# Patient Record
Sex: Female | Born: 1945 | Race: Black or African American | Hispanic: No | Marital: Single | Smoking: Former smoker
Health system: Southern US, Community
[De-identification: ages and names within clinical notes are randomized; demographics above are authoritative.]

## PROBLEM LIST (undated history)

## (undated) DIAGNOSIS — M4800 Spinal stenosis, site unspecified: Secondary | ICD-10-CM

## (undated) DIAGNOSIS — E039 Hypothyroidism, unspecified: Secondary | ICD-10-CM

## (undated) DIAGNOSIS — E119 Type 2 diabetes mellitus without complications: Secondary | ICD-10-CM

## (undated) DIAGNOSIS — N390 Urinary tract infection, site not specified: Secondary | ICD-10-CM

## (undated) DIAGNOSIS — Z9889 Other specified postprocedural states: Secondary | ICD-10-CM

## (undated) DIAGNOSIS — R112 Nausea with vomiting, unspecified: Secondary | ICD-10-CM

## (undated) DIAGNOSIS — I10 Essential (primary) hypertension: Secondary | ICD-10-CM

## (undated) DIAGNOSIS — J449 Chronic obstructive pulmonary disease, unspecified: Secondary | ICD-10-CM

## (undated) DIAGNOSIS — J45909 Unspecified asthma, uncomplicated: Secondary | ICD-10-CM

## (undated) DIAGNOSIS — D649 Anemia, unspecified: Secondary | ICD-10-CM

## (undated) DIAGNOSIS — E669 Obesity, unspecified: Secondary | ICD-10-CM

## (undated) DIAGNOSIS — R739 Hyperglycemia, unspecified: Secondary | ICD-10-CM

## (undated) DIAGNOSIS — M1990 Unspecified osteoarthritis, unspecified site: Secondary | ICD-10-CM

## (undated) DIAGNOSIS — K219 Gastro-esophageal reflux disease without esophagitis: Secondary | ICD-10-CM

## (undated) DIAGNOSIS — E876 Hypokalemia: Secondary | ICD-10-CM

## (undated) HISTORY — PX: CHOLECYSTECTOMY: SHX55

## (undated) HISTORY — PX: ANKLE FUSION: SHX881

## (undated) HISTORY — PX: ABDOMINAL HYSTERECTOMY: SHX81

---

## 1997-04-27 ENCOUNTER — Other Ambulatory Visit: Admission: RE | Admit: 1997-04-27 | Discharge: 1997-04-27 | Payer: Self-pay | Admitting: Obstetrics

## 1997-11-23 ENCOUNTER — Ambulatory Visit (HOSPITAL_COMMUNITY): Admission: RE | Admit: 1997-11-23 | Discharge: 1997-11-23 | Payer: Self-pay | Admitting: Pulmonary Disease

## 1997-11-23 ENCOUNTER — Encounter: Payer: Self-pay | Admitting: Pulmonary Disease

## 1998-02-23 ENCOUNTER — Encounter: Admission: RE | Admit: 1998-02-23 | Discharge: 1998-05-24 | Payer: Self-pay | Admitting: Orthopedic Surgery

## 1998-07-30 ENCOUNTER — Encounter: Payer: Self-pay | Admitting: Pulmonary Disease

## 1998-07-30 ENCOUNTER — Ambulatory Visit (HOSPITAL_COMMUNITY): Admission: RE | Admit: 1998-07-30 | Discharge: 1998-07-30 | Payer: Self-pay | Admitting: Pulmonary Disease

## 1999-01-01 ENCOUNTER — Ambulatory Visit (HOSPITAL_COMMUNITY): Admission: RE | Admit: 1999-01-01 | Discharge: 1999-01-01 | Payer: Self-pay | Admitting: Obstetrics

## 1999-01-01 ENCOUNTER — Encounter: Payer: Self-pay | Admitting: Obstetrics

## 1999-02-08 ENCOUNTER — Emergency Department (HOSPITAL_COMMUNITY): Admission: EM | Admit: 1999-02-08 | Discharge: 1999-02-08 | Payer: Self-pay | Admitting: Emergency Medicine

## 1999-02-12 ENCOUNTER — Encounter: Payer: Self-pay | Admitting: Emergency Medicine

## 1999-02-12 ENCOUNTER — Emergency Department (HOSPITAL_COMMUNITY): Admission: EM | Admit: 1999-02-12 | Discharge: 1999-02-12 | Payer: Self-pay | Admitting: Emergency Medicine

## 2000-01-03 ENCOUNTER — Encounter: Payer: Self-pay | Admitting: Pulmonary Disease

## 2000-01-03 ENCOUNTER — Ambulatory Visit (HOSPITAL_COMMUNITY): Admission: RE | Admit: 2000-01-03 | Discharge: 2000-01-03 | Payer: Self-pay | Admitting: Pulmonary Disease

## 2000-02-06 ENCOUNTER — Emergency Department (HOSPITAL_COMMUNITY): Admission: EM | Admit: 2000-02-06 | Discharge: 2000-02-06 | Payer: Self-pay | Admitting: *Deleted

## 2000-09-21 ENCOUNTER — Ambulatory Visit (HOSPITAL_COMMUNITY): Admission: RE | Admit: 2000-09-21 | Discharge: 2000-09-21 | Payer: Self-pay | Admitting: Pulmonary Disease

## 2000-09-21 ENCOUNTER — Encounter: Payer: Self-pay | Admitting: Pulmonary Disease

## 2001-03-09 ENCOUNTER — Inpatient Hospital Stay (HOSPITAL_COMMUNITY): Admission: AD | Admit: 2001-03-09 | Discharge: 2001-03-09 | Payer: Self-pay | Admitting: Obstetrics

## 2001-06-29 ENCOUNTER — Encounter: Payer: Self-pay | Admitting: Pulmonary Disease

## 2001-06-29 ENCOUNTER — Inpatient Hospital Stay (HOSPITAL_COMMUNITY): Admission: AD | Admit: 2001-06-29 | Discharge: 2001-07-01 | Payer: Self-pay | Admitting: Pulmonary Disease

## 2001-09-20 ENCOUNTER — Emergency Department (HOSPITAL_COMMUNITY): Admission: EM | Admit: 2001-09-20 | Discharge: 2001-09-20 | Payer: Self-pay | Admitting: Emergency Medicine

## 2002-04-25 ENCOUNTER — Encounter: Payer: Self-pay | Admitting: Pulmonary Disease

## 2002-04-25 ENCOUNTER — Ambulatory Visit (HOSPITAL_COMMUNITY): Admission: RE | Admit: 2002-04-25 | Discharge: 2002-04-25 | Payer: Self-pay | Admitting: Pulmonary Disease

## 2003-04-18 ENCOUNTER — Emergency Department (HOSPITAL_COMMUNITY): Admission: EM | Admit: 2003-04-18 | Discharge: 2003-04-18 | Payer: Self-pay | Admitting: Emergency Medicine

## 2003-06-07 ENCOUNTER — Encounter: Admission: RE | Admit: 2003-06-07 | Discharge: 2003-07-27 | Payer: Self-pay | Admitting: Pulmonary Disease

## 2003-06-08 ENCOUNTER — Ambulatory Visit (HOSPITAL_COMMUNITY): Admission: RE | Admit: 2003-06-08 | Discharge: 2003-06-08 | Payer: Self-pay | Admitting: Pulmonary Disease

## 2003-08-23 ENCOUNTER — Ambulatory Visit (HOSPITAL_COMMUNITY): Admission: RE | Admit: 2003-08-23 | Discharge: 2003-08-23 | Payer: Self-pay | Admitting: Pulmonary Disease

## 2003-12-05 ENCOUNTER — Ambulatory Visit: Payer: Self-pay | Admitting: Physical Medicine & Rehabilitation

## 2003-12-05 ENCOUNTER — Inpatient Hospital Stay (HOSPITAL_COMMUNITY): Admission: RE | Admit: 2003-12-05 | Discharge: 2003-12-10 | Payer: Self-pay | Admitting: Orthopedic Surgery

## 2004-02-26 ENCOUNTER — Encounter: Admission: RE | Admit: 2004-02-26 | Discharge: 2004-02-26 | Payer: Self-pay | Admitting: Orthopedic Surgery

## 2004-05-06 ENCOUNTER — Encounter
Admission: RE | Admit: 2004-05-06 | Discharge: 2004-08-04 | Payer: Self-pay | Admitting: Physical Medicine and Rehabilitation

## 2004-08-23 ENCOUNTER — Ambulatory Visit (HOSPITAL_COMMUNITY): Admission: RE | Admit: 2004-08-23 | Discharge: 2004-08-23 | Payer: Self-pay | Admitting: Pulmonary Disease

## 2005-08-25 ENCOUNTER — Ambulatory Visit (HOSPITAL_COMMUNITY): Admission: RE | Admit: 2005-08-25 | Discharge: 2005-08-25 | Payer: Self-pay | Admitting: Obstetrics

## 2006-07-27 ENCOUNTER — Ambulatory Visit (HOSPITAL_COMMUNITY): Admission: RE | Admit: 2006-07-27 | Discharge: 2006-07-27 | Payer: Self-pay | Admitting: Pulmonary Disease

## 2006-08-07 ENCOUNTER — Emergency Department (HOSPITAL_COMMUNITY): Admission: EM | Admit: 2006-08-07 | Discharge: 2006-08-07 | Payer: Self-pay | Admitting: Emergency Medicine

## 2006-08-31 ENCOUNTER — Ambulatory Visit (HOSPITAL_COMMUNITY): Admission: RE | Admit: 2006-08-31 | Discharge: 2006-08-31 | Payer: Self-pay | Admitting: Pulmonary Disease

## 2007-02-17 ENCOUNTER — Emergency Department (HOSPITAL_COMMUNITY): Admission: EM | Admit: 2007-02-17 | Discharge: 2007-02-17 | Payer: Self-pay | Admitting: Emergency Medicine

## 2007-09-02 ENCOUNTER — Ambulatory Visit (HOSPITAL_COMMUNITY): Admission: RE | Admit: 2007-09-02 | Discharge: 2007-09-02 | Payer: Self-pay | Admitting: Obstetrics

## 2007-10-07 ENCOUNTER — Emergency Department (HOSPITAL_COMMUNITY): Admission: EM | Admit: 2007-10-07 | Discharge: 2007-10-07 | Payer: Self-pay | Admitting: Emergency Medicine

## 2008-03-05 ENCOUNTER — Emergency Department (HOSPITAL_COMMUNITY): Admission: EM | Admit: 2008-03-05 | Discharge: 2008-03-05 | Payer: Self-pay | Admitting: Family Medicine

## 2008-08-03 ENCOUNTER — Encounter: Admission: RE | Admit: 2008-08-03 | Discharge: 2008-08-03 | Payer: Self-pay | Admitting: Orthopedic Surgery

## 2008-09-04 ENCOUNTER — Ambulatory Visit (HOSPITAL_COMMUNITY): Admission: RE | Admit: 2008-09-04 | Discharge: 2008-09-04 | Payer: Self-pay | Admitting: Pulmonary Disease

## 2009-09-03 ENCOUNTER — Emergency Department (HOSPITAL_COMMUNITY): Admission: EM | Admit: 2009-09-03 | Discharge: 2009-09-03 | Payer: Self-pay | Admitting: Emergency Medicine

## 2009-09-06 ENCOUNTER — Ambulatory Visit (HOSPITAL_COMMUNITY): Admission: RE | Admit: 2009-09-06 | Discharge: 2009-09-06 | Payer: Self-pay | Admitting: Family Medicine

## 2010-02-27 ENCOUNTER — Emergency Department (HOSPITAL_COMMUNITY): Payer: No Typology Code available for payment source

## 2010-02-27 ENCOUNTER — Emergency Department (HOSPITAL_COMMUNITY)
Admission: EM | Admit: 2010-02-27 | Discharge: 2010-02-27 | Disposition: A | Discharge status: Home / Self Care | Payer: No Typology Code available for payment source | Attending: Emergency Medicine | Admitting: Emergency Medicine

## 2010-02-27 DIAGNOSIS — J449 Chronic obstructive pulmonary disease, unspecified: Secondary | ICD-10-CM | POA: Insufficient documentation

## 2010-02-27 DIAGNOSIS — Y92410 Unspecified street and highway as the place of occurrence of the external cause: Secondary | ICD-10-CM | POA: Insufficient documentation

## 2010-02-27 DIAGNOSIS — M545 Low back pain, unspecified: Secondary | ICD-10-CM | POA: Insufficient documentation

## 2010-02-27 DIAGNOSIS — T1490XA Injury, unspecified, initial encounter: Secondary | ICD-10-CM | POA: Insufficient documentation

## 2010-02-27 DIAGNOSIS — M542 Cervicalgia: Secondary | ICD-10-CM | POA: Insufficient documentation

## 2010-02-27 DIAGNOSIS — J4489 Other specified chronic obstructive pulmonary disease: Secondary | ICD-10-CM | POA: Insufficient documentation

## 2010-05-24 NOTE — Op Note (Signed)
NAMEJEANNA, GIUFFRE NO.:  [id number]   MEDICAL RECORD NO.:  [id number]          PATIENT TYPE:  INP   LOCATION:  2899                         FACILITY:  [facility]   PHYSICIAN:  Deidre Ala, M.D.    DATE OF BIRTH:  [birth year]   DATE OF PROCEDURE:  12/05/2003  DATE OF DISCHARGE:                                 OPERATIVE REPORT   PREOPERATIVE DIAGNOSIS:  End-stage degenerative joint disease left ankle,  status post previous open reduction and internal fixation of bimalleolar  fracture elsewhere.   POSTOPERATIVE DIAGNOSIS:  End-stage degenerative joint disease left ankle,  status post previous open reduction and internal fixation of bimalleolar  fracture elsewhere.   PROCEDURE:  Open arthrodesis left ankle with Allograft cancellous bone and  fluoroscopy intraoperative with internal fixation with two 7.3 cannulated  cancellous screws and washers.   SURGEON:  1.  Charlesetta Shanks, M.D.   ASSISTANT:  Clarene Reamer, P.A.-C.   ANESTHESIA:  General with LMA.   CURRENT MEDICATIONS:  None.   DRAINS:  None.   ESTIMATED BLOOD LOSS:  Minimal.   TOURNIQUET TIME:  Two hours.   PATHOLOGIC FINDINGS AND HISTORY:  Ms. Tretter, back in 1991, had had an ORIF  of a bimalleolar ankle fracture with disruption of the deltoid ligament.  Later she had screws removed.  She continued to have pain up until recently  and was referred by Dr. Mina Marble, where x-rays revealed no metal  remaining but end-stage post traumatic DJD.  We elected to proceed with  ankle fusion.  She is 65 years old.  At surgery, no untoward features were  known.  We removed all the cartilage from all surfaces including medially  and laterally, placed allograft cancellous bone chips in place and under C-  arm fluoroscopy, placed two, one medial and one lateral 7.3 cannulated to  cancellous screws with washers in slightly different planes with fixation  with the foot in about five degrees of plantar  flexion to accommodate a  heel.   DESCRIPTION OF PROCEDURE:  With adequate anesthesia obtained using LMA  technique, 1 g Ancef given IV prophylaxis and another one at tourniquet let  down.  The patient was placed in the supine position and the left lower  extremity was prepped from the toes to the knee in the standard fashion.  After standard prepping and draping, esmarch exsanguination was used.  The  tourniquet was let up to 350 and later 375 mmHg.  An anterolateral incision  was then made.  Incision was deepened sharply with the knife and hemostasis  obtained using Bovie electrocoagulator.  Dissection was carried down and the  superficial peroneal branch was carefully protected and retracted.  Dissection was carried down to the capsule which was incised.  We then used  a rongeur to remove osteophytes and capsule and exposed anterolateral joint.  Then careful meticulous removal of all the cartilage was done with curved  and straight osteotomes and rongeur and then a bur, egg shape type,  including the lateral sulcus medial to the fibula.  We then made a similar  incision  on the anteromedial side.  Dissection carried down with care to  protect the saphenous vein.  Similar technique was used as the opposite side  to debride all the articular cartilage in the sulcus medially and over the  dome and plafond.  I then, with lamina spreader, packed in 40 mL of  morselized allograft bone chips and then placed under C-arm fluoroscopy from  posterior to slightly anterior medial and lateral and slightly more anterior  medial two crisscross 7.3 cannulated cancellous screws.  The lateral one was  40 mm, the medial one was 60.  Washers were used.  C-arm fluoroscopy  confirmed positioning with a slight plantar flexion mode and the screws were  tightened down fixing the talus within the mortis.  When I was satisfied  with the arthrodesis on the C-arm with good bone graft placed in all  recesses, the  wounds were irrigated and closed with 2-0 and 3-0 Vicryl on  the subcutaneous and capsule and skin staples and the holes for the  percutaneous screw applications which were posterior to the main wounds were  closed with 3-0 Vicryl and skin staples.  A bulky sterile compressive  dressing was applied with U stirrup splint and posterior splint and Ace.  The patient then was awakened, taken to the recovery room in satisfactory  condition for routine postoperative care, elevation, analgesia and  antibiotics.       VEP/MEDQ  D:  12/05/2003  T:  12/05/2003  Job:  [id number]   cc:   Eino Farber., M.D.  [address] [place]  [place] [address]  Fax: [phone]

## 2010-05-24 NOTE — Discharge Summary (Signed)
NAMETERRION, POBLANO NO.:  [id number]   MEDICAL RECORD NO.:  [id number]          PATIENT TYPE:  INP   LOCATION:  5023                         FACILITY:  [facility]   PHYSICIAN:  Deidre Ala, M.D.    DATE OF BIRTH:  August 30, 1945   DATE OF ADMISSION:  12/05/2003  DATE OF DISCHARGE:  12/10/2003                                 DISCHARGE SUMMARY   ADMISSION DIAGNOSES:  1.  End-stage osteoarthritis of the left ankle.  2.  Asthma.   DISCHARGE DIAGNOSES:  1.  End-stage osteoarthritis of the left ankle, status post left ankle      arthrodesis and fusion.  2.  Asthma.  3.  Postoperative hemorrhagic anemia, stable at the time of discharge.   PROCEDURE:  The patient was taken to the operating room on December 05, 2003  and underwent left ankle arthrodesis, open.  Surgeon was Dr. Charlesetta Shanks,  assistant Clarene Reamer, PA-C.  Surgery was done under general anesthesia.   CONSULTATIONS:  1.  Physical therapy.  2.  Occupational therapy.  3.  Social work.  4.  Case management.  5.  Physical medicine and rehabilitation with Dr. Ellwood Dense.   BRIEF CLINICAL HISTORY:  The patient is a 65 year old female with a long  history of left ankle pain.  Up to this point, the patient has failed all  nonsurgical conservative treatments.  Radiographs in the office show end-  stage degenerative joint disease of the left tibiotalar joint.  Upon  reviewing these x-rays, Dr. Renae Fickle felt it was best to proceed with a left  open ankle arthrodesis.  The patient agreed.  The risks and benefits of the  surgery were discussed with the patient, and the patient wished to proceed.   LABORATORY DATA:  CBC on admission showed a hemoglobin of 12.0, hematocrit  35.5, white blood cell count 9.7, red blood cell count 4.42.  Serial  hemoglobins and hematocrits were performed throughout hospital stay.  Hemoglobin and hematocrit did decline to 10.5 and 30.8 on December 07, 2003,  but were stable at the time  of discharge.  Differential on admission was all  within normal limits except eosinophils were slightly high at 6.  Coagulation studies were all within normal limits.  Routine chemistry on  admission was all within normal limits.  Serial chemistries were followed  throughout the hospital stay.  Glucose ranged from a low of 98 on admission  to a high of 127 on December 06, 2003.  The sodium did fall to 134 on  December 07, 2003, but was stable at the time of discharge.  Urinalysis on  admission was all within normal limits.  EKG on admission showed normal  sinus rhythm with a normal EKG.   HOSPITAL COURSE:  The patient was admitted to [facility] and taken  to the operating room.  She underwent the above-stated procedure without  complications.  The patient was allowed to return to the recovery room and  orthopedic floor to continue postoperative care.  On postop day #1, the  patient was resting comfortably.  She was afebrile.  Hemoglobin was 11.1 and  hematocrit 32.3.  The dressing was clean, dry and intact.  The patient was  to work with physical therapy and occupational therapy.  She is to be non-  weightbearing to the left lower extremity.  PCA was discontinued on this  date.  On December 07, 2003, postop day #2, the patient was complaining of  some pain as expected.  Hemoglobin was 10.5 and hematocrit 30.8.  Heart rate  was 101.  She was wiggling her toes in the splint.  Splint was clean, dry  and intact.  The patient was to continue working with physical therapy and  occupational therapy.  IV was heparin-locked on this day.  A SACU consult  was pending.  On December 08, 2003, postop day #3, the patient was resting  comfortably, T max 99.3.  She continued to remain in a splint which was  clean, dry and intact, wiggling her toes.  She continued to work with  physical therapy and occupational therapy.  On December 09, 2003, postop day  #4, the patient complained of left ankle pain.   She was progressing slowly  with physical therapy.  She was afebrile.  Splint was intact to left lower  extremity and she continued to move toes.  The patient was to continue  working with physical therapy.  On December 10, 2003, postop day #5, the  patient progressed well enough in therapy to be discharged home, and she  wished to be discharged home on this day.  T max was 99.9.  Splint was  clean, dry and intact.  She was moving her left toes actively, and the  patient was subsequently discharged home on this date.   DISPOSITION:  The patient was discharged home on December 10, 2003.   DISCHARGE MEDICATIONS:  1.  Mepergan fortis 1-2 p.o. q.4-6h. p.r.n. pain.  2.  Robaxin 750 mg 1 p.o. q.8h. p.r.n. spasm.   DIET:  As tolerated.   ACTIVITY:  The patient was non-weightbearing to the left lower extremity.  The patient had [facility] for home care.   WOUND:  The patient was to remain in splint, to keep splint clean, dry and  intact until followup.   FOLLOWUP:  The patient was to follow up in 10 days from the day of  discharge.  She was to call the office for an appointment at [phone].   CONDITION ON DISCHARGE:  Stable and improved.      SW/MEDQ  D:  02/06/2004  T:  02/06/2004  Job:  [id number]

## 2010-07-06 ENCOUNTER — Emergency Department (HOSPITAL_COMMUNITY)
Admission: EM | Admit: 2010-07-06 | Discharge: 2010-07-06 | Disposition: A | Discharge status: Home / Self Care | Payer: Medicare Other | Attending: Emergency Medicine | Admitting: Emergency Medicine

## 2010-07-06 ENCOUNTER — Emergency Department (HOSPITAL_COMMUNITY): Payer: Medicare Other

## 2010-07-06 DIAGNOSIS — J449 Chronic obstructive pulmonary disease, unspecified: Secondary | ICD-10-CM | POA: Insufficient documentation

## 2010-07-06 DIAGNOSIS — M79609 Pain in unspecified limb: Secondary | ICD-10-CM | POA: Insufficient documentation

## 2010-07-06 DIAGNOSIS — J4489 Other specified chronic obstructive pulmonary disease: Secondary | ICD-10-CM | POA: Insufficient documentation

## 2010-07-06 DIAGNOSIS — G8929 Other chronic pain: Secondary | ICD-10-CM | POA: Insufficient documentation

## 2010-07-06 DIAGNOSIS — L02419 Cutaneous abscess of limb, unspecified: Secondary | ICD-10-CM | POA: Insufficient documentation

## 2010-07-06 DIAGNOSIS — S8990XA Unspecified injury of unspecified lower leg, initial encounter: Secondary | ICD-10-CM | POA: Insufficient documentation

## 2010-07-06 DIAGNOSIS — M1990 Unspecified osteoarthritis, unspecified site: Secondary | ICD-10-CM | POA: Insufficient documentation

## 2010-07-06 DIAGNOSIS — S8010XA Contusion of unspecified lower leg, initial encounter: Secondary | ICD-10-CM | POA: Insufficient documentation

## 2010-07-06 DIAGNOSIS — M549 Dorsalgia, unspecified: Secondary | ICD-10-CM | POA: Insufficient documentation

## 2010-07-06 DIAGNOSIS — M129 Arthropathy, unspecified: Secondary | ICD-10-CM | POA: Insufficient documentation

## 2010-07-06 DIAGNOSIS — IMO0002 Reserved for concepts with insufficient information to code with codable children: Secondary | ICD-10-CM | POA: Insufficient documentation

## 2010-08-20 ENCOUNTER — Other Ambulatory Visit (HOSPITAL_COMMUNITY): Payer: Self-pay | Admitting: Pulmonary Disease

## 2010-08-20 DIAGNOSIS — Z1231 Encounter for screening mammogram for malignant neoplasm of breast: Secondary | ICD-10-CM

## 2010-09-12 ENCOUNTER — Ambulatory Visit (HOSPITAL_COMMUNITY)
Admission: RE | Admit: 2010-09-12 | Discharge: 2010-09-12 | Disposition: A | Discharge status: Home / Self Care | Payer: Medicare Other | Source: Ambulatory Visit | Attending: Pulmonary Disease | Admitting: Pulmonary Disease

## 2010-09-12 DIAGNOSIS — Z1231 Encounter for screening mammogram for malignant neoplasm of breast: Secondary | ICD-10-CM | POA: Insufficient documentation

## 2010-09-27 LAB — POCT URINALYSIS DIP (DEVICE)
Bilirubin Urine: NEGATIVE
Glucose, UA: NEGATIVE
Hgb urine dipstick: NEGATIVE
Ketones, ur: NEGATIVE
Nitrite: NEGATIVE
Operator id: 239701
Protein, ur: NEGATIVE
Specific Gravity, Urine: 1.01
Urobilinogen, UA: 0.2
pH: 6.5

## 2010-09-27 LAB — POCT RAPID STREP A: Streptococcus, Group A Screen (Direct): NEGATIVE

## 2010-10-08 LAB — POCT URINALYSIS DIP (DEVICE)
Bilirubin Urine: NEGATIVE
Glucose, UA: NEGATIVE
Nitrite: POSITIVE — AB

## 2010-10-08 LAB — DIFFERENTIAL
Basophils Absolute: 0.1
Lymphocytes Relative: 16
Monocytes Absolute: 1.1 — ABNORMAL HIGH
Monocytes Relative: 11
Neutro Abs: 7.3

## 2010-10-08 LAB — URINE CULTURE: Colony Count: >=100000

## 2010-10-08 LAB — CBC
Hemoglobin: 11.7 — ABNORMAL LOW
RBC: 4.24

## 2010-10-21 LAB — I-STAT 8, (EC8 V) (CONVERTED LAB)
BUN: 12
Glucose, Bld: 109 — ABNORMAL HIGH
Potassium: 3.3 — ABNORMAL LOW
TCO2: 26
pH, Ven: 7.415 — ABNORMAL HIGH

## 2010-10-21 LAB — URINALYSIS, ROUTINE W REFLEX MICROSCOPIC
Glucose, UA: NEGATIVE
Ketones, ur: NEGATIVE
Specific Gravity, Urine: 1.01
pH: 6

## 2010-10-21 LAB — URINE CULTURE: Colony Count: >=100000

## 2010-10-21 LAB — URINE MICROSCOPIC-ADD ON

## 2010-10-21 LAB — POCT I-STAT CREATININE: Operator id: 234501

## 2011-08-26 ENCOUNTER — Other Ambulatory Visit (HOSPITAL_COMMUNITY): Payer: Self-pay | Admitting: Pulmonary Disease

## 2011-08-26 DIAGNOSIS — Z1231 Encounter for screening mammogram for malignant neoplasm of breast: Secondary | ICD-10-CM

## 2011-09-15 ENCOUNTER — Ambulatory Visit (HOSPITAL_COMMUNITY): Payer: Medicare Other

## 2011-09-25 ENCOUNTER — Ambulatory Visit (HOSPITAL_COMMUNITY)
Admission: RE | Admit: 2011-09-25 | Discharge: 2011-09-25 | Disposition: A | Discharge status: Home / Self Care | Payer: Medicare Other | Source: Ambulatory Visit | Attending: Pulmonary Disease | Admitting: Pulmonary Disease

## 2011-09-25 DIAGNOSIS — Z1231 Encounter for screening mammogram for malignant neoplasm of breast: Secondary | ICD-10-CM | POA: Insufficient documentation

## 2011-11-06 ENCOUNTER — Other Ambulatory Visit (HOSPITAL_COMMUNITY): Payer: Self-pay | Admitting: Pulmonary Disease

## 2012-03-11 ENCOUNTER — Other Ambulatory Visit: Payer: Self-pay | Admitting: Physical Medicine and Rehabilitation

## 2012-03-11 ENCOUNTER — Other Ambulatory Visit: Payer: Self-pay | Admitting: Family Medicine

## 2012-03-11 DIAGNOSIS — M545 Low back pain: Secondary | ICD-10-CM

## 2012-03-13 ENCOUNTER — Other Ambulatory Visit: Payer: Medicare Other

## 2012-03-15 ENCOUNTER — Ambulatory Visit
Admission: RE | Admit: 2012-03-15 | Discharge: 2012-03-15 | Disposition: A | Discharge status: Home / Self Care | Payer: Medicare Other | Source: Ambulatory Visit | Attending: Physical Medicine and Rehabilitation | Admitting: Physical Medicine and Rehabilitation

## 2012-03-15 DIAGNOSIS — M545 Low back pain: Secondary | ICD-10-CM

## 2012-08-26 ENCOUNTER — Other Ambulatory Visit (HOSPITAL_COMMUNITY): Payer: Self-pay | Admitting: Pulmonary Disease

## 2012-08-26 DIAGNOSIS — Z1231 Encounter for screening mammogram for malignant neoplasm of breast: Secondary | ICD-10-CM

## 2012-09-28 ENCOUNTER — Ambulatory Visit (HOSPITAL_COMMUNITY)
Admission: RE | Admit: 2012-09-28 | Discharge: 2012-09-28 | Disposition: A | Discharge status: Home / Self Care | Payer: Medicare Other | Source: Ambulatory Visit | Attending: Pulmonary Disease | Admitting: Pulmonary Disease

## 2012-09-28 DIAGNOSIS — Z1231 Encounter for screening mammogram for malignant neoplasm of breast: Secondary | ICD-10-CM

## 2013-01-24 ENCOUNTER — Emergency Department (HOSPITAL_COMMUNITY): Payer: Medicare Other

## 2013-01-24 ENCOUNTER — Encounter (HOSPITAL_COMMUNITY): Payer: Self-pay | Admitting: Emergency Medicine

## 2013-01-24 ENCOUNTER — Emergency Department (HOSPITAL_COMMUNITY)
Admission: EM | Admit: 2013-01-24 | Discharge: 2013-01-24 | Disposition: A | Discharge status: Home / Self Care | Payer: Medicare Other | Attending: Emergency Medicine | Admitting: Emergency Medicine

## 2013-01-24 DIAGNOSIS — J029 Acute pharyngitis, unspecified: Secondary | ICD-10-CM | POA: Insufficient documentation

## 2013-01-24 DIAGNOSIS — Z8719 Personal history of other diseases of the digestive system: Secondary | ICD-10-CM | POA: Insufficient documentation

## 2013-01-24 DIAGNOSIS — R0789 Other chest pain: Secondary | ICD-10-CM | POA: Insufficient documentation

## 2013-01-24 DIAGNOSIS — J441 Chronic obstructive pulmonary disease with (acute) exacerbation: Secondary | ICD-10-CM | POA: Insufficient documentation

## 2013-01-24 HISTORY — DX: Chronic obstructive pulmonary disease, unspecified: J44.9

## 2013-01-24 HISTORY — DX: Gastro-esophageal reflux disease without esophagitis: K21.9

## 2013-01-24 LAB — CBC WITH DIFFERENTIAL/PLATELET
BASOS ABS: 0 10*3/uL (ref 0.0–0.1)
BASOS PCT: 0 % (ref 0–1)
EOS ABS: 0.3 10*3/uL (ref 0.0–0.7)
Eosinophils Relative: 2 % (ref 0–5)
HCT: 33.3 % — ABNORMAL LOW (ref 36.0–46.0)
Hemoglobin: 11 g/dL — ABNORMAL LOW (ref 12.0–15.0)
Lymphocytes Relative: 19 % (ref 12–46)
Lymphs Abs: 2 10*3/uL (ref 0.7–4.0)
MCH: 27.3 pg (ref 26.0–34.0)
MCHC: 33 g/dL (ref 30.0–36.0)
MCV: 82.6 fL (ref 78.0–100.0)
Monocytes Absolute: 1.1 10*3/uL — ABNORMAL HIGH (ref 0.1–1.0)
Monocytes Relative: 10 % (ref 3–12)
NEUTROS PCT: 69 % (ref 43–77)
Neutro Abs: 7.4 10*3/uL (ref 1.7–7.7)
PLATELETS: 271 10*3/uL (ref 150–400)
RBC: 4.03 MIL/uL (ref 3.87–5.11)
RDW: 14.6 % (ref 11.5–15.5)
WBC: 10.7 10*3/uL — ABNORMAL HIGH (ref 4.0–10.5)

## 2013-01-24 LAB — BASIC METABOLIC PANEL
BUN: 21 mg/dL (ref 6–23)
CO2: 22 mEq/L (ref 19–32)
Calcium: 9.8 mg/dL (ref 8.4–10.5)
Chloride: 106 mEq/L (ref 96–112)
Creatinine, Ser: 0.72 mg/dL (ref 0.50–1.10)
GFR, EST NON AFRICAN AMERICAN: 88 mL/min — AB (ref >90)
Glucose, Bld: 137 mg/dL — ABNORMAL HIGH (ref 70–99)
POTASSIUM: 4.2 meq/L (ref 3.7–5.3)
SODIUM: 141 meq/L (ref 137–147)

## 2013-01-24 LAB — TROPONIN I: Troponin I: <0.3 ng/mL (ref <0.30)

## 2013-01-24 MED ORDER — METHYLPREDNISOLONE SODIUM SUCC 125 MG IJ SOLR
125.0000 mg | Freq: Once | INTRAMUSCULAR | Status: AC
  Administered 2013-01-24: 125 mg via INTRAMUSCULAR

## 2013-01-24 MED ORDER — ALBUTEROL SULFATE (2.5 MG/3ML) 0.083% IN NEBU
5.0000 mg | INHALATION_SOLUTION | Freq: Once | RESPIRATORY_TRACT | Status: AC
  Administered 2013-01-24: 5 mg via RESPIRATORY_TRACT
  Filled 2013-01-24: qty 6

## 2013-01-24 MED ORDER — PREDNISONE 50 MG PO TABS: ORAL_TABLET | ORAL | Status: DC

## 2013-01-24 MED ORDER — BENZONATATE 100 MG PO CAPS: 100.0000 mg | ORAL_CAPSULE | Freq: Two times a day (BID) | ORAL | Status: DC | PRN

## 2013-01-24 MED ORDER — DOXYCYCLINE HYCLATE 100 MG PO CAPS: 100.0000 mg | ORAL_CAPSULE | Freq: Two times a day (BID) | ORAL | Status: AC

## 2013-01-24 MED ORDER — METHYLPREDNISOLONE SODIUM SUCC 125 MG IJ SOLR
125.0000 mg | Freq: Once | INTRAMUSCULAR | Status: DC
  Filled 2013-01-24: qty 2

## 2013-01-24 MED ORDER — ACETAMINOPHEN 325 MG PO TABS
650.0000 mg | ORAL_TABLET | Freq: Once | ORAL | Status: AC
  Administered 2013-01-24: 650 mg via ORAL
  Filled 2013-01-24: qty 2

## 2013-01-24 NOTE — ED Provider Notes (Signed)
CSN: [id number]     Arrival date & time 01/24/13  [id number] History   First MD Initiated Contact with Patient 01/24/13 1012     Chief Complaint  Patient presents with  . Shortness of Breath   (Consider location/radiation/quality/duration/timing/severity/associated sxs/prior Treatment) Patient is a 68 y.o. female presenting with shortness of breath. The history is provided by the patient.  Shortness of Breath Severity:  Mild Onset quality:  Gradual Duration:  12 days Timing:  Intermittent Progression:  Waxing and waning Chronicity:  New Context: URI   Relieved by:  Nothing Worsened by:  Nothing tried Ineffective treatments:  None tried Associated symptoms: cough and sputum production   Associated symptoms: no abdominal pain, no chest pain, no fever, no headaches, no neck pain and no vomiting     Past Medical History  Diagnosis Date  . COPD (chronic obstructive pulmonary disease)   . Acid reflux    No past surgical history on file. No family history on file. History  Substance Use Topics  . Smoking status: Never Smoker   . Smokeless tobacco: Not on file  . Alcohol Use: No   OB History   Grav Para Term Preterm Abortions TAB SAB Ect Mult Living                 Review of Systems  Constitutional: Negative for fever and fatigue.  HENT: Positive for congestion and sinus pressure. Negative for drooling.   Eyes: Negative for pain.  Respiratory: Positive for cough, sputum production, chest tightness and shortness of breath.   Cardiovascular: Negative for chest pain.  Gastrointestinal: Negative for nausea, vomiting, abdominal pain and diarrhea.  Genitourinary: Negative for dysuria and hematuria.  Musculoskeletal: Negative for back pain, gait problem and neck pain.  Skin: Negative for color change.  Neurological: Negative for dizziness and headaches.  Hematological: Negative for adenopathy.  Psychiatric/Behavioral: Negative for behavioral problems.  All other systems reviewed and  are negative.    Allergies  Codeine and Morphine and related  Home Medications  No current outpatient prescriptions on file. BP 123/58  Pulse 68  Temp(Src) 98.3 F (36.8 C)  Resp 16  SpO2 98% Physical Exam  Nursing note and vitals reviewed. Constitutional: She is oriented to person, place, and time. She appears well-developed and well-nourished.  HENT:  Head: Normocephalic.  Mouth/Throat: No oropharyngeal exudate.  Eyes: Conjunctivae and EOM are normal. Pupils are equal, round, and reactive to light.  Neck: Normal range of motion. Neck supple.  Cardiovascular: Normal rate, regular rhythm, normal heart sounds and intact distal pulses.  Exam reveals no gallop and no friction rub.   No murmur heard. Pulmonary/Chest: Effort normal. No respiratory distress. She has wheezes (mild expiratory wheezing bilaterally ). She exhibits tenderness (mild upper central chest ttp).  Abdominal: Soft. Bowel sounds are normal. There is no tenderness. There is no rebound and no guarding.  Musculoskeletal: Normal range of motion. She exhibits no edema and no tenderness.  Neurological: She is alert and oriented to person, place, and time.  Skin: Skin is warm and dry.  Psychiatric: She has a normal mood and affect. Her behavior is normal.    ED Course  Procedures (including critical care time) Labs Review Labs Reviewed  CBC WITH DIFFERENTIAL - Abnormal; Notable for the following:    WBC 10.7 (*)    Hemoglobin 11.0 (*)    HCT 33.3 (*)    Monocytes Absolute 1.1 (*)    All other components within normal limits  BASIC METABOLIC PANEL -  Abnormal; Notable for the following:    Glucose, Bld 137 (*)    GFR calc non Af Amer 88 (*)    All other components within normal limits  TROPONIN I   Imaging Review Dg Chest 2 View  01/24/2013   CLINICAL DATA:  Cough.  Short of breath.  EXAM: CHEST  2 VIEW  COMPARISON:  12/04/2003  FINDINGS: Heart, mediastinum and hila are unremarkable. Lungs are mildly  hyperexpanded but clear. No pleural effusion or pneumothorax. Bony thorax is demineralized but intact.  IMPRESSION: No active cardiopulmonary disease.   Electronically Signed   By: Amie Portland M.D.   On: 01/24/2013 11:25    EKG Interpretation    Date/Time:  Monday January 24 2013 10:07:35 EST Ventricular Rate:  70 PR Interval:  148 QRS Duration: 86 QT Interval:  378 QTC Calculation: 408 R Axis:   70 Text Interpretation:  Normal sinus rhythm Normal ECG Confirmed by Cerissa Zeiger  MD, Ameen Mostafa ([id number]) on 01/24/2013 12:35:57 PM            MDM   1. COPD exacerbation    10:48 AM 68 y.o. female who presents with viral URI type symptoms for the last 10-12 days. She notes acute worsening over the last 2-3 days. She complains of cough productive of yellow and white sputum. Also subjective fever, mild shortness of breath, wheezing, sneezing, and mild sore throat. She is afebrile and vital signs are unremarkable here. She also notes some soreness in her chest and her abdomen which she suspects is from coughing. Will get screening labs, imaging, breathing treatment, and steroids.  12:36 PM: Pt feeling mildly better. CP/abd pain likely related to cough. Lungs now clear on exam. I suspect she has a copd exac. I have discussed the diagnosis/risks/treatment options with the patient and believe the pt to be eligible for discharge home to follow-up with pcp in 2-3 days as needed. We also discussed returning to the ED immediately if new or worsening sx occur. We discussed the sx which are most concerning (e.g., worsening sob, fever, cp) that necessitate immediate return. Any new prescriptions provided to the patient are listed below.  New Prescriptions   BENZONATATE (TESSALON) 100 MG CAPSULE    Take 1 capsule (100 mg total) by mouth 2 (two) times daily as needed for cough.   DOXYCYCLINE (VIBRAMYCIN) 100 MG CAPSULE    Take 1 capsule (100 mg total) by mouth 2 (two) times daily. One po bid x 10 days    PREDNISONE (DELTASONE) 50 MG TABLET    Take 1 tablet by mouth daily for the next 4 days.       Junius Argyle, MD 01/24/13 [phone]

## 2013-01-24 NOTE — ED Notes (Signed)
Patient transported to X-ray 

## 2013-01-24 NOTE — ED Notes (Signed)
Wheezing and sob  X 4 days coughing and sneezing  congestion

## 2013-01-24 NOTE — Discharge Instructions (Signed)
Chronic Asthmatic Bronchitis °Chronic asthmatic bronchitis is a complication of persistent asthma. After a period of time with asthma, some people develop airflow obstruction that is present all the time, even when not having an asthma attack. There is also persistent inflammation of the airways, and the bronchial tubes produce more mucus. Chronic asthmatic bronchitis usually is a permanent problem with the lungs. °CAUSES  °Chronic asthmatic bronchitis happens most often in people who have asthma and also smoke cigarettes. Occasionally, it can happen to a person with long-standing or severe asthma even if the person is not a smoker. °SIGNS AND SYMPTOMS  °Chronic asthmatic bronchitis usually causes symptoms of both asthma and chronic bronchitis, including:  °· Coughing. °· Increased sputum production. °· Wheezing and shortness of breath. °· Chest discomfort. °· Recurring infections. °DIAGNOSIS  °Your health care provider will take a medical history and perform a physical exam. Chronic asthmatic bronchitis is suspected when a person with asthma has abnormal results on breathing tests (pulmonary function tests) even when breathing symptoms are at their best. Other tests, such as a chest X-ray, may be performed to rule out other conditions.  °TREATMENT  °Treatment involves controlling symptoms with medicine and lifestyle changes. °· Your health care provider may prescribe asthma medicines, including inhaler and nebulizer medicines. °· Infection can be treated with medicine to kill germs (antibiotics). Serious infections may require hospitalization. These can include: °· Pneumonia. °· Sinus infections. °· Acute bronchitis.   °· Preventing infection and hospitalization is very important. Get an influenza vaccination every year as directed by your health care provider. Ask your health care provider whether you need a pneumonia vaccine. °· Ask your health care provider whether you would benefit from a pulmonary  rehabilitation program. °HOME CARE INSTRUCTIONS °· Only take over-the-counter or prescription medicine as directed by your health care provider. °· If you are a cigarette smoker, the most important thing that you can do is quit. Talk to your health care provider for help with quitting smoking. °· Avoid pollen, dust, animal dander, molds, smoke, and other things that cause attacks. °· Regular exercise is very important to help you feel better. Discuss possible exercise routines with your health care provider. °· If animal dander is the cause of asthma, you may not be able to keep pets. °· It is important that you: °· Become educated about your medical condition. °· Participate in maintaining wellness. °· Seek medical care as directed. Delay in seeking medical care could cause permanent injury and may be a risk to your life. °SEEK MEDICAL CARE IF: °· You have wheezing and shortness of breath even if taking medicine to prevent attacks. °· You have muscle aches, chest pain, or thickening of sputum. °· Your sputum changes from clear or white to yellow, green, gray, or bloody. °SEEK IMMEDIATE MEDICAL CARE IF: °· Your usual medicines do not stop your wheezing. °· You have increased coughing or shortness of breath or both. °· You have increased difficulty breathing. °· You have any problems from the medicine you are taking, such as a rash, itching, swelling, or trouble breathing. °MAKE SURE YOU:  °· Understand these instructions. °· Will watch your condition. °· Will get help right away if you are not doing well or get worse. °Document Released: 10/10/2005 Document Revised: 10/13/2012 Document Reviewed: 07/22/2012 °[employer]® Patient Information ©2014 [employer]. ° °

## 2013-08-26 ENCOUNTER — Other Ambulatory Visit (HOSPITAL_COMMUNITY): Payer: Self-pay | Admitting: Internal Medicine

## 2013-08-26 DIAGNOSIS — Z1231 Encounter for screening mammogram for malignant neoplasm of breast: Secondary | ICD-10-CM

## 2013-09-29 ENCOUNTER — Ambulatory Visit (HOSPITAL_COMMUNITY): Payer: Medicare Other

## 2013-10-06 ENCOUNTER — Ambulatory Visit (HOSPITAL_COMMUNITY)
Admission: RE | Admit: 2013-10-06 | Discharge: 2013-10-06 | Disposition: A | Discharge status: Home / Self Care | Payer: Medicare Other | Source: Ambulatory Visit | Attending: Internal Medicine | Admitting: Internal Medicine

## 2013-10-06 DIAGNOSIS — Z1231 Encounter for screening mammogram for malignant neoplasm of breast: Secondary | ICD-10-CM | POA: Insufficient documentation

## 2013-10-13 ENCOUNTER — Emergency Department (HOSPITAL_COMMUNITY): Payer: Medicare Other

## 2013-10-13 ENCOUNTER — Encounter (HOSPITAL_COMMUNITY): Payer: Self-pay | Admitting: Emergency Medicine

## 2013-10-13 ENCOUNTER — Emergency Department (HOSPITAL_COMMUNITY)
Admission: EM | Admit: 2013-10-13 | Discharge: 2013-10-13 | Disposition: A | Discharge status: Home / Self Care | Payer: Medicare Other | Attending: Emergency Medicine | Admitting: Emergency Medicine

## 2013-10-13 DIAGNOSIS — Z7951 Long term (current) use of inhaled steroids: Secondary | ICD-10-CM | POA: Diagnosis not present

## 2013-10-13 DIAGNOSIS — Z7952 Long term (current) use of systemic steroids: Secondary | ICD-10-CM | POA: Diagnosis not present

## 2013-10-13 DIAGNOSIS — J449 Chronic obstructive pulmonary disease, unspecified: Secondary | ICD-10-CM | POA: Diagnosis not present

## 2013-10-13 DIAGNOSIS — M25561 Pain in right knee: Secondary | ICD-10-CM | POA: Diagnosis not present

## 2013-10-13 DIAGNOSIS — Z8719 Personal history of other diseases of the digestive system: Secondary | ICD-10-CM | POA: Diagnosis not present

## 2013-10-13 DIAGNOSIS — M545 Low back pain: Secondary | ICD-10-CM | POA: Insufficient documentation

## 2013-10-13 DIAGNOSIS — Z79899 Other long term (current) drug therapy: Secondary | ICD-10-CM | POA: Insufficient documentation

## 2013-10-13 DIAGNOSIS — R2689 Other abnormalities of gait and mobility: Secondary | ICD-10-CM | POA: Diagnosis not present

## 2013-10-13 HISTORY — DX: Spinal stenosis, site unspecified: M48.00

## 2013-10-13 MED ORDER — METHOCARBAMOL 500 MG PO TABS: 500.0000 mg | ORAL_TABLET | Freq: Two times a day (BID) | ORAL | Status: DC

## 2013-10-13 MED ORDER — IBUPROFEN 400 MG PO TABS
800.0000 mg | ORAL_TABLET | Freq: Once | ORAL | Status: AC
  Administered 2013-10-13: 800 mg via ORAL
  Filled 2013-10-13: qty 2

## 2013-10-13 MED ORDER — TRAMADOL HCL 50 MG PO TABS: 50.0000 mg | ORAL_TABLET | Freq: Four times a day (QID) | ORAL | Status: DC | PRN

## 2013-10-13 NOTE — ED Notes (Signed)
Patient states she has new R knee pain.  Patient denies injury.  Patient states she doesn't notice any swelling.   Patient states she has severe spinal stenosis that "bothers me all the time.  But I don't need to be seen for that.  Just gotta get my knee right.  I had use crutches to get to car with help from my grandson today".

## 2013-10-13 NOTE — ED Notes (Signed)
Pt states she woke this am with right knee pain. No known injury. Slight swelling noted.

## 2013-10-13 NOTE — Discharge Instructions (Signed)
Pleas wear knee brace, take pain medication as needed.  If you develop chest pressure, trouble breathing, rash, new numbness or increase swelling then return promptly for further care.    Knee Pain The knee is the complex joint between your thigh and your lower leg. It is made up of bones, tendons, ligaments, and cartilage. The bones that make up the knee are:  The femur in the thigh.  The tibia and fibula in the lower leg.  The patella or kneecap riding in the groove on the lower femur. CAUSES  Knee pain is a common complaint with many causes. A few of these causes are:  Injury, such as:  A ruptured ligament or tendon injury.  Torn cartilage.  Medical conditions, such as:  Gout  Arthritis  Infections  Overuse, over training, or overdoing a physical activity. Knee pain can be minor or severe. Knee pain can accompany debilitating injury. Minor knee problems often respond well to self-care measures or get well on their own. More serious injuries may need medical intervention or even surgery. SYMPTOMS The knee is complex. Symptoms of knee problems can vary widely. Some of the problems are:  Pain with movement and weight bearing.  Swelling and tenderness.  Buckling of the knee.  Inability to straighten or extend your knee.  Your knee locks and you cannot straighten it.  Warmth and redness with pain and fever.  Deformity or dislocation of the kneecap. DIAGNOSIS  Determining what is wrong may be very straight forward such as when there is an injury. It can also be challenging because of the complexity of the knee. Tests to make a diagnosis may include:  Your caregiver taking a history and doing a physical exam.  Routine X-rays can be used to rule out other problems. X-rays will not reveal a cartilage tear. Some injuries of the knee can be diagnosed by:  Arthroscopy a surgical technique by which a small video camera is inserted through tiny incisions on the sides of the  knee. This procedure is used to examine and repair internal knee joint problems. Tiny instruments can be used during arthroscopy to repair the torn knee cartilage (meniscus).  Arthrography is a radiology technique. A contrast liquid is directly injected into the knee joint. Internal structures of the knee joint then become visible on X-ray film.  An MRI scan is a non X-ray radiology procedure in which magnetic fields and a computer produce two- or three-dimensional images of the inside of the knee. Cartilage tears are often visible using an MRI scanner. MRI scans have largely replaced arthrography in diagnosing cartilage tears of the knee.  Blood work.  Examination of the fluid that helps to lubricate the knee joint (synovial fluid). This is done by taking a sample out using a needle and a syringe. TREATMENT The treatment of knee problems depends on the cause. Some of these treatments are:  Depending on the injury, proper casting, splinting, surgery, or physical therapy care will be needed.  Give yourself adequate recovery time. Do not overuse your joints. If you begin to get sore during workout routines, back off. Slow down or do fewer repetitions.  For repetitive activities such as cycling or running, maintain your strength and nutrition.  Alternate muscle groups. For example, if you are a weight lifter, work the upper body on one day and the lower body the next.  Either tight or weak muscles do not give the proper support for your knee. Tight or weak muscles do not absorb  the stress placed on the knee joint. Keep the muscles surrounding the knee strong.  Take care of mechanical problems.  If you have flat feet, orthotics or special shoes may help. See your caregiver if you need help.  Arch supports, sometimes with wedges on the inner or outer aspect of the heel, can help. These can shift pressure away from the side of the knee most bothered by osteoarthritis.  A brace called an  "unloader" brace also may be used to help ease the pressure on the most arthritic side of the knee.  If your caregiver has prescribed crutches, braces, wraps or ice, use as directed. The acronym for this is PRICE. This means protection, rest, ice, compression, and elevation.  Nonsteroidal anti-inflammatory drugs (NSAIDs), can help relieve pain. But if taken immediately after an injury, they may actually increase swelling. Take NSAIDs with food in your stomach. Stop them if you develop stomach problems. Do not take these if you have a history of ulcers, stomach pain, or bleeding from the bowel. Do not take without your caregiver's approval if you have problems with fluid retention, heart failure, or kidney problems.  For ongoing knee problems, physical therapy may be helpful.  Glucosamine and chondroitin are over-the-counter dietary supplements. Both may help relieve the pain of osteoarthritis in the knee. These medicines are different from the usual anti-inflammatory drugs. Glucosamine may decrease the rate of cartilage destruction.  Injections of a corticosteroid drug into your knee joint may help reduce the symptoms of an arthritis flare-up. They may provide pain relief that lasts a few months. You may have to wait a few months between injections. The injections do have a small increased risk of infection, water retention, and elevated blood sugar levels.  Hyaluronic acid injected into damaged joints may ease pain and provide lubrication. These injections may work by reducing inflammation. A series of shots may give relief for as long as 6 months.  Topical painkillers. Applying certain ointments to your skin may help relieve the pain and stiffness of osteoarthritis. Ask your pharmacist for suggestions. Many over the-counter products are approved for temporary relief of arthritis pain.  In some countries, doctors often prescribe topical NSAIDs for relief of chronic conditions such as arthritis and  tendinitis. A review of treatment with NSAID creams found that they worked as well as oral medications but without the serious side effects. PREVENTION  Maintain a healthy weight. Extra pounds put more strain on your joints.  Get strong, stay limber. Weak muscles are a common cause of knee injuries. Stretching is important. Include flexibility exercises in your workouts.  Be smart about exercise. If you have osteoarthritis, chronic knee pain or recurring injuries, you may need to change the way you exercise. This does not mean you have to stop being active. If your knees ache after jogging or playing basketball, consider switching to swimming, water aerobics, or other low-impact activities, at least for a few days a week. Sometimes limiting high-impact activities will provide relief.  Make sure your shoes fit well. Choose footwear that is right for your sport.  Protect your knees. Use the proper gear for knee-sensitive activities. Use kneepads when playing volleyball or laying carpet. Buckle your seat belt every time you drive. Most shattered kneecaps occur in car accidents.  Rest when you are tired. SEEK MEDICAL CARE IF:  You have knee pain that is continual and does not seem to be getting better.  SEEK IMMEDIATE MEDICAL CARE IF:  Your knee joint feels hot  to the touch and you have a high fever. MAKE SURE YOU:   Understand these instructions.  Will watch your condition.  Will get help right away if you are not doing well or get worse. Document Released: 10/20/2006 Document Revised: 03/17/2011 Document Reviewed: 10/20/2006 [employer] Patient Information 2015 [place], [place]. This information is not intended to replace advice given to you by your health care provider. Make sure you discuss any questions you have with your health care provider.

## 2013-10-13 NOTE — ED Provider Notes (Signed)
CSN: [id number]     Arrival date & time 10/13/13  1228 History  This chart was scribed for non-physician practitioner [name], working with Rolland Porter, MD by Carl Best, ED Scribe. This patient was seen in room TR06C/TR06C and the patient's care was started at 1:29 PM.    Chief Complaint  Patient presents with  . Knee Pain    Patient is a 68 y.o. female presenting with knee pain. The history is provided by the patient. No language interpreter was used.  Knee Pain Associated symptoms: no fever    HPI Comments: Lindsay [name] is a 68 y.o. female with a 5 year history of spinal stenosis and osteo degenerative arthritis who presents to the Emergency Department complaining of constant, severe right knee pain that started when she woke up this morning.  She states that lower back pain has been gradually worsening over the past 2 weeks and has caused her to decrease activity and experience numbness in her right thigh nightly.  She had to run errands yesterday which caused her to be on her feet for an extended period of time and believes this may have caused her right knee pain.  She denies any recent trauma or injury to her right knee but states that she is unable to bear weight on it.  She took one muscle relaxer PTA and 800mg  Ibuprofen with no relief to her pain.  She denies fever, chills, cough, and SOB as associated symptoms.  She has received steroid injections in her right knee in the past.  Dr. Modesto Charon at [facility] follows her spinal stenosis and gives her a series of shots which relieves her lower back pain.  She has an appointment with him tomorrow.  She is allergic to Codeine and Morphine.    Past Medical History  Diagnosis Date  . COPD (chronic obstructive pulmonary disease)   . Acid reflux   . Spinal stenosis    History reviewed. No pertinent past surgical history. No family history on file. History  Substance Use Topics  . Smoking status: Never Smoker   . Smokeless  tobacco: Not on file  . Alcohol Use: No   OB History   Grav Para Term Preterm Abortions TAB SAB Ect Mult Living                 Review of Systems  Constitutional: Negative for fever and chills.  Respiratory: Negative for cough and shortness of breath.   Musculoskeletal: Positive for arthralgias and gait problem.  All other systems reviewed and are negative.     Allergies  Codeine and Morphine and related  Home Medications   Prior to Admission medications   Medication Sig Start Date End Date Taking? Authorizing Provider  albuterol (PROVENTIL HFA;VENTOLIN HFA) 108 (90 BASE) MCG/ACT inhaler Inhale 1-2 puffs into the lungs every 6 (six) hours as needed for wheezing or shortness of breath.    Historical Provider, MD  benzonatate (TESSALON) 100 MG capsule Take 1 capsule (100 mg total) by mouth 2 (two) times daily as needed for cough. 01/24/13   Purvis Sheffield, MD  Fluticasone-Salmeterol (ADVAIR) 500-50 MCG/DOSE AEPB Inhale 1 puff into the lungs 2 (two) times daily.    Historical Provider, MD  ibuprofen (ADVIL,MOTRIN) 800 MG tablet Take 800 mg by mouth 3 (three) times daily.    Historical Provider, MD  mometasone-formoterol (DULERA) 100-5 MCG/ACT AERO Inhale 2 puffs into the lungs 2 (two) times daily.    Historical Provider, MD  montelukast (SINGULAIR)  10 MG tablet Take 10 mg by mouth daily.    Historical Provider, MD  predniSONE (DELTASONE) 5 MG tablet Take 5 mg by mouth daily.    Historical Provider, MD  predniSONE (DELTASONE) 50 MG tablet Take 1 tablet by mouth daily for the next 4 days. 01/24/13   Purvis Sheffield, MD   BP 123/69  Pulse 70  Temp(Src) 98.1 F (36.7 C) (Oral)  Resp 18  SpO2 99% Physical Exam  Nursing note and vitals reviewed. Constitutional: She is oriented to person, place, and time. She appears well-developed and well-nourished.  HENT:  Head: Normocephalic and atraumatic.  Eyes: EOM are normal.  Neck: Normal range of motion.  Cardiovascular: Normal rate.    Pedal pulses palpable.  Pulmonary/Chest: Effort normal.  Musculoskeletal: Normal range of motion.  Tenderness to palpation of the posterior fossa on the right knee.  No appreciable baker's cyst.  Pain with verus and valgus maneuver but no joint laxity.  Right ankle with full range of motion.  Lumbar and para lumbar tenderness.  Positive straight leg raise.    Neurological: She is alert and oriented to person, place, and time.  Skin: Skin is warm and dry.  Psychiatric: She has a normal mood and affect. Her behavior is normal.    ED Course  Procedures (including critical care time)  DIAGNOSTIC STUDIES: Oxygen Saturation is 99% on room air, normal by my interpretation.    COORDINATION OF CARE: 1:41 PM- Discussed obtaining an x-ray of the patient's right knee and a doppler study to rule out a DVT.  Will administer pain medication in the ED.  The patient agreed to the treatment plan.  4:21 PM Xray demonstrates DDD but no other significant finding.  Knee sleeve provide for support.  Pt does nto want to wait for vascular doppler study to r/o dvt.  Pt sts she does not think she has blood clot.  She understand the risk of possible blood clot and understand to return for further care if sxs worsen.  Otherwise, pt will be d/c with pain medication, muscle relaxant.    Labs Review Labs Reviewed - No data to display  Imaging Review Dg Knee Complete 4 Views Right  10/13/2013   CLINICAL DATA:  Awakened this morning with severe right knee pain and inability to bear weight ; no known injury; initial encounter  EXAM: RIGHT KNEE - COMPLETE 4+ VIEW  COMPARISON:  None.  FINDINGS: The bones of the right knee are adequately mineralized. There is beaking of the tibial spines. There is narrowing of the lateral joint compartment. There is a large peripheral osteophyte from the lateral margin of the lateral tibial plateau. The proximal fibula is intact. There are small spurs arising from the superior and inferior  margins of the patella. There is no joint effusion.  IMPRESSION: There is no acute bony abnormality of the right knee. There is moderate degenerative change affecting the lateral joint compartment with milder degenerative changes elsewhere.   Electronically Signed   By: David  [place]   On: 10/13/2013 14:12     EKG Interpretation None      MDM   Final diagnoses:  Posterior right knee pain    BP 134/67  Pulse 76  Temp(Src) 98.3 F (36.8 C) (Oral)  Resp 18  SpO2 99%  I have reviewed nursing notes and vital signs. I personally reviewed the imaging tests through PACS system  I reviewed available ER/hospitalization records thought the EMR   I personally performed the services  described in this documentation, which was scribed in my presence. The recorded information has been reviewed and is accurate.     Fayrene HelperBowie Maximillian Habibi, PA-C 10/13/13 1623

## 2013-10-21 NOTE — ED Provider Notes (Signed)
Medical screening examination/treatment/procedure(s) were performed by non-physician practitioner and as supervising physician I was immediately available for consultation/collaboration.   EKG Interpretation None        Prue Lingenfelter, MD 10/21/13 0913 

## 2014-09-12 ENCOUNTER — Other Ambulatory Visit: Payer: Self-pay

## 2014-09-12 ENCOUNTER — Other Ambulatory Visit (HOSPITAL_COMMUNITY): Payer: Self-pay | Admitting: Internal Medicine

## 2014-09-12 DIAGNOSIS — Z1231 Encounter for screening mammogram for malignant neoplasm of breast: Secondary | ICD-10-CM

## 2014-10-20 ENCOUNTER — Ambulatory Visit
Admission: RE | Admit: 2014-10-20 | Discharge: 2014-10-20 | Disposition: A | Discharge status: Home / Self Care | Payer: Medicare Other | Source: Ambulatory Visit

## 2014-10-20 DIAGNOSIS — Z1231 Encounter for screening mammogram for malignant neoplasm of breast: Secondary | ICD-10-CM

## 2015-04-26 ENCOUNTER — Ambulatory Visit (HOSPITAL_BASED_OUTPATIENT_CLINIC_OR_DEPARTMENT_OTHER): Payer: Medicare Other

## 2015-10-30 ENCOUNTER — Ambulatory Visit
Admission: RE | Admit: 2015-10-30 | Discharge: 2015-10-30 | Disposition: A | Discharge status: Home / Self Care | Payer: Medicare Other | Source: Ambulatory Visit | Attending: Internal Medicine | Admitting: Internal Medicine

## 2015-10-30 DIAGNOSIS — Z1231 Encounter for screening mammogram for malignant neoplasm of breast: Secondary | ICD-10-CM

## 2016-05-06 ENCOUNTER — Encounter: Payer: Self-pay | Admitting: Orthopedic Surgery

## 2016-05-17 ENCOUNTER — Emergency Department (HOSPITAL_COMMUNITY): Payer: Medicare Other

## 2016-05-17 ENCOUNTER — Encounter (HOSPITAL_COMMUNITY): Payer: Self-pay | Admitting: Emergency Medicine

## 2016-05-17 ENCOUNTER — Observation Stay (HOSPITAL_COMMUNITY)
Admission: EM | Admit: 2016-05-17 | Discharge: 2016-05-18 | Disposition: A | Discharge status: Home / Self Care | Payer: Medicare Other | Attending: Family Medicine | Admitting: Family Medicine

## 2016-05-17 DIAGNOSIS — R0600 Dyspnea, unspecified: Secondary | ICD-10-CM

## 2016-05-17 DIAGNOSIS — K219 Gastro-esophageal reflux disease without esophagitis: Secondary | ICD-10-CM

## 2016-05-17 DIAGNOSIS — R739 Hyperglycemia, unspecified: Secondary | ICD-10-CM

## 2016-05-17 DIAGNOSIS — N390 Urinary tract infection, site not specified: Secondary | ICD-10-CM

## 2016-05-17 DIAGNOSIS — E876 Hypokalemia: Secondary | ICD-10-CM

## 2016-05-17 DIAGNOSIS — R072 Precordial pain: Principal | ICD-10-CM | POA: Insufficient documentation

## 2016-05-17 DIAGNOSIS — R079 Chest pain, unspecified: Secondary | ICD-10-CM

## 2016-05-17 DIAGNOSIS — J449 Chronic obstructive pulmonary disease, unspecified: Secondary | ICD-10-CM | POA: Diagnosis not present

## 2016-05-17 DIAGNOSIS — E669 Obesity, unspecified: Secondary | ICD-10-CM | POA: Diagnosis present

## 2016-05-17 DIAGNOSIS — Z79899 Other long term (current) drug therapy: Secondary | ICD-10-CM | POA: Diagnosis not present

## 2016-05-17 HISTORY — DX: Unspecified asthma, uncomplicated: J45.909

## 2016-05-17 HISTORY — DX: Urinary tract infection, site not specified: N39.0

## 2016-05-17 HISTORY — DX: Hyperglycemia, unspecified: R73.9

## 2016-05-17 HISTORY — DX: Obesity, unspecified: E66.9

## 2016-05-17 HISTORY — DX: Hypokalemia: E87.6

## 2016-05-17 LAB — GLUCOSE, CAPILLARY
GLUCOSE-CAPILLARY: 128 mg/dL — AB (ref 65–99)
GLUCOSE-CAPILLARY: 177 mg/dL — AB (ref 65–99)
GLUCOSE-CAPILLARY: 146 mg/dL — AB (ref 65–99)
Glucose-Capillary: 147 mg/dL — ABNORMAL HIGH (ref 65–99)

## 2016-05-17 LAB — BASIC METABOLIC PANEL
ANION GAP: 11 (ref 5–15)
BUN: 14 mg/dL (ref 6–20)
CALCIUM: 9.9 mg/dL (ref 8.9–10.3)
CO2: 27 mmol/L (ref 22–32)
Chloride: 95 mmol/L — ABNORMAL LOW (ref 101–111)
Creatinine, Ser: 0.89 mg/dL (ref 0.44–1.00)
GFR calc Af Amer: >60 mL/min (ref >60)
GFR calc non Af Amer: >60 mL/min (ref >60)
Glucose, Bld: 200 mg/dL — ABNORMAL HIGH (ref 65–99)
Potassium: 3.3 mmol/L — ABNORMAL LOW (ref 3.5–5.1)
Sodium: 133 mmol/L — ABNORMAL LOW (ref 135–145)

## 2016-05-17 LAB — LIPID PANEL
CHOL/HDL RATIO: 5.8 ratio
Cholesterol: 204 mg/dL — ABNORMAL HIGH (ref 0–200)
HDL: 35 mg/dL — AB (ref >40)
LDL CALC: 156 mg/dL — AB (ref 0–99)
TRIGLYCERIDES: 64 mg/dL (ref <150)
VLDL: 13 mg/dL (ref 0–40)

## 2016-05-17 LAB — CBC
HCT: 39 % (ref 36.0–46.0)
HEMOGLOBIN: 12.7 g/dL (ref 12.0–15.0)
MCH: 26.7 pg (ref 26.0–34.0)
MCHC: 32.6 g/dL (ref 30.0–36.0)
MCV: 82.1 fL (ref 78.0–100.0)
Platelets: 376 10*3/uL (ref 150–400)
RBC: 4.75 MIL/uL (ref 3.87–5.11)
RDW: 13 % (ref 11.5–15.5)
WBC: 12.2 10*3/uL — ABNORMAL HIGH (ref 4.0–10.5)

## 2016-05-17 LAB — I-STAT TROPONIN, ED: Troponin i, poc: 0 ng/mL (ref 0.00–0.08)

## 2016-05-17 LAB — TROPONIN I

## 2016-05-17 LAB — PHOSPHORUS: Phosphorus: 3.5 mg/dL (ref 2.5–4.6)

## 2016-05-17 LAB — MAGNESIUM: Magnesium: 2.2 mg/dL (ref 1.7–2.4)

## 2016-05-17 MED ORDER — ENOXAPARIN SODIUM 40 MG/0.4ML ~~LOC~~ SOLN
40.0000 mg | SUBCUTANEOUS | Status: DC
  Administered 2016-05-17 – 2016-05-18 (×2): 40 mg via SUBCUTANEOUS
  Filled 2016-05-17 (×2): qty 0.4

## 2016-05-17 MED ORDER — INSULIN ASPART 100 UNIT/ML ~~LOC~~ SOLN: 0.0000 [IU] | Freq: Every day | SUBCUTANEOUS | Status: DC

## 2016-05-17 MED ORDER — NITROFURANTOIN MONOHYD MACRO 100 MG PO CAPS
100.0000 mg | ORAL_CAPSULE | Freq: Two times a day (BID) | ORAL | Status: DC
  Administered 2016-05-17 – 2016-05-18 (×2): 100 mg via ORAL
  Filled 2016-05-17 (×4): qty 1

## 2016-05-17 MED ORDER — CYCLOBENZAPRINE HCL 10 MG PO TABS
10.0000 mg | ORAL_TABLET | Freq: Two times a day (BID) | ORAL | Status: DC
  Administered 2016-05-17: 10 mg via ORAL
  Filled 2016-05-17 (×3): qty 1

## 2016-05-17 MED ORDER — GI COCKTAIL ~~LOC~~
30.0000 mL | Freq: Once | ORAL | Status: AC
  Administered 2016-05-17: 30 mL via ORAL
  Filled 2016-05-17: qty 30

## 2016-05-17 MED ORDER — HYDROCHLOROTHIAZIDE 25 MG PO TABS
12.5000 mg | ORAL_TABLET | Freq: Every day | ORAL | Status: DC
  Administered 2016-05-18: 12.5 mg via ORAL
  Filled 2016-05-17 (×2): qty 1

## 2016-05-17 MED ORDER — SODIUM CHLORIDE 0.9 % IV SOLN
INTRAVENOUS | Status: AC
  Administered 2016-05-17: 13:00:00 via INTRAVENOUS

## 2016-05-17 MED ORDER — INSULIN ASPART 100 UNIT/ML ~~LOC~~ SOLN
0.0000 [IU] | Freq: Three times a day (TID) | SUBCUTANEOUS | Status: DC
  Administered 2016-05-18: 3 [IU] via SUBCUTANEOUS

## 2016-05-17 MED ORDER — POTASSIUM CHLORIDE CRYS ER 20 MEQ PO TBCR
40.0000 meq | EXTENDED_RELEASE_TABLET | Freq: Once | ORAL | Status: AC
  Administered 2016-05-17: 40 meq via ORAL
  Filled 2016-05-17: qty 2

## 2016-05-17 MED ORDER — ACETAMINOPHEN 325 MG PO TABS
650.0000 mg | ORAL_TABLET | ORAL | Status: DC | PRN
  Administered 2016-05-18: 650 mg via ORAL
  Filled 2016-05-17: qty 2

## 2016-05-17 MED ORDER — PANTOPRAZOLE SODIUM 20 MG PO TBEC
20.0000 mg | DELAYED_RELEASE_TABLET | Freq: Every day | ORAL | Status: DC
  Administered 2016-05-17 – 2016-05-18 (×2): 20 mg via ORAL
  Filled 2016-05-17 (×2): qty 1

## 2016-05-17 MED ORDER — ASPIRIN 325 MG PO TABS
325.0000 mg | ORAL_TABLET | Freq: Every day | ORAL | Status: DC
  Administered 2016-05-18: 325 mg via ORAL
  Filled 2016-05-17: qty 1

## 2016-05-17 MED ORDER — GI COCKTAIL ~~LOC~~: 30.0000 mL | Freq: Four times a day (QID) | ORAL | Status: DC | PRN

## 2016-05-17 MED ORDER — DOCUSATE SODIUM 100 MG PO CAPS
100.0000 mg | ORAL_CAPSULE | Freq: Every day | ORAL | Status: DC
  Administered 2016-05-17 – 2016-05-18 (×2): 100 mg via ORAL
  Filled 2016-05-17 (×2): qty 1

## 2016-05-17 MED ORDER — ONDANSETRON HCL 4 MG/2ML IJ SOLN
4.0000 mg | Freq: Four times a day (QID) | INTRAMUSCULAR | Status: DC | PRN
  Administered 2016-05-18: 4 mg via INTRAVENOUS
  Filled 2016-05-17: qty 2

## 2016-05-17 MED ORDER — ASPIRIN 81 MG PO CHEW
324.0000 mg | CHEWABLE_TABLET | Freq: Once | ORAL | Status: AC
  Administered 2016-05-17: 324 mg via ORAL
  Filled 2016-05-17: qty 4

## 2016-05-17 MED ORDER — ALBUTEROL SULFATE (2.5 MG/3ML) 0.083% IN NEBU
2.5000 mg | INHALATION_SOLUTION | Freq: Four times a day (QID) | RESPIRATORY_TRACT | Status: DC | PRN
  Administered 2016-05-17 – 2016-05-18 (×2): 2.5 mg via RESPIRATORY_TRACT
  Filled 2016-05-17 (×2): qty 3

## 2016-05-17 MED ORDER — CALCIUM CARBONATE ANTACID 500 MG PO CHEW
1.0000 | CHEWABLE_TABLET | Freq: Three times a day (TID) | ORAL | Status: DC
  Administered 2016-05-17 – 2016-05-18 (×4): 200 mg via ORAL
  Filled 2016-05-17 (×4): qty 1

## 2016-05-17 MED ORDER — FERROUS SULFATE 325 (65 FE) MG PO TABS
325.0000 mg | ORAL_TABLET | Freq: Every day | ORAL | Status: DC
  Administered 2016-05-17: 325 mg via ORAL
  Filled 2016-05-17 (×2): qty 1

## 2016-05-17 NOTE — Progress Notes (Signed)
I come to give the patient her medicine as ordered but Pt states that she already took some of her home medication as she had brough some from home. . I have told the patient to not take any of her home medication while she is in the hospital.  Colleen Canesar Mayling Aber, RN

## 2016-05-17 NOTE — ED Notes (Signed)
Dr. Hobbs at bedside  

## 2016-05-17 NOTE — ED Provider Notes (Signed)
[facility]-EMERGENCY DEPT Provider Note   CSN: [id number] Arrival date & time: 05/17/16  0745     History   Chief Complaint Chief Complaint  Patient presents with  . Chest Pain  . Arm Pain  . Gastroesophageal Reflux  . Night Sweats    HPI Lindsay [name] is a 71 y.o. female.  The history is provided by the patient.  Chest Pain   This is a new problem. The current episode started 2 days ago. The problem has been resolved. The pain is associated with rest. The pain is present in the substernal region. The pain is moderate. The quality of the pain is described as heavy, vice-like and stabbing. The pain radiates to the left arm and right arm. The symptoms are aggravated by certain positions. Associated symptoms include back pain (chronic), diaphoresis, nausea and shortness of breath. Pertinent negatives include no abdominal pain, no exertional chest pressure, no fever, no leg pain, no lower extremity edema, no near-syncope, no numbness, no syncope, no vomiting and no weakness. She has tried antacids for the symptoms. The treatment provided mild relief. Risk factors include being elderly, obesity and sedentary lifestyle.  Her past medical history is significant for COPD, hyperlipidemia and hypertension.  Arm Pain  Associated symptoms include chest pain and shortness of breath. Pertinent negatives include no abdominal pain.  Gastroesophageal Reflux  Associated symptoms include chest pain and shortness of breath. Pertinent negatives include no abdominal pain.    71 year old female who presents with chest pain. History of GERD, HTN, COPD, HLD, obesity. No significant history of chest pain before. Thursday night developed severe dull central chest pain ongoing for few hours. Tried to take tums and belched with some improvement in pain. Ate vienna sausages earlier in the day, but nothing that night. Symptoms recurred yesterday evening, not associated with eating. Lasted throughout the night. This  morning drank gingerale, which improved symptoms a little after belching. Now chest pain free. Pain not worse with activity. Did get diaphoretic with pain and mild dyspnea. No syncope, vomiting, leg swelling, leg pain, abdominal pain, fevers or chills.   Past Medical History:  Diagnosis Date  . Acid reflux   . COPD (chronic obstructive pulmonary disease) (HCC)   . Spinal stenosis     Patient Active Problem List   Diagnosis Date Noted  . COPD exacerbation (HCC) 01/24/2013    History reviewed. No pertinent surgical history.  OB History    No data available       Home Medications    Prior to Admission medications   Medication Sig Start Date End Date Taking? Authorizing Provider  cyclobenzaprine (FLEXERIL) 10 MG tablet Take 10 mg by mouth 2 (two) times daily. 02/27/16  Yes [provider]  ferrous sulfate 325 (65 FE) MG tablet Take 325 mg by mouth daily. 03/05/16  Yes [provider]  hydrochlorothiazide (HYDRODIURIL) 12.5 MG tablet Take 12.5 mg by mouth daily.   Yes [provider]  nitrofurantoin, macrocrystal-monohydrate, (MACROBID) 100 MG capsule Take 100 mg by mouth daily. Take one capsule by mouth twice da 05/06/16  Yes [provider]  oxybutynin (DITROPAN-XL) 10 MG 24 hr tablet Take 10 mg by mouth daily. 04/25/16  Yes [provider]  RA STOOL SOFTENER 100 MG capsule Take 100 mg by mouth daily. 03/11/16  Yes [provider]  albuterol (PROVENTIL HFA;VENTOLIN HFA) 108 (90 BASE) MCG/ACT inhaler Inhale 1-2 puffs into the lungs every 6 (six) hours as needed for wheezing or shortness  of breath.    [provider]  traMADol (ULTRAM) 50 MG tablet Take 1 tablet (50 mg total) by mouth every 6 (six) hours as needed. Patient not taking: Reported on 05/17/2016 10/13/13   [name], PA-C    Family History No family history on file. Non-contributory Social History Social History  Substance Use Topics  . Smoking status: Never  Smoker  . Smokeless tobacco: Never Used  . Alcohol use No     Allergies   Codeine and Morphine and related   Review of Systems Review of Systems  Constitutional: Positive for diaphoresis. Negative for fever.  Respiratory: Positive for shortness of breath.   Cardiovascular: Positive for chest pain. Negative for syncope and near-syncope.  Gastrointestinal: Positive for nausea. Negative for abdominal pain and vomiting.  Genitourinary: Negative for difficulty urinating.  Musculoskeletal: Positive for back pain (chronic).  Neurological: Negative for weakness and numbness.  All other systems reviewed and are negative.    Physical Exam Updated Vital Signs BP 115/88 (BP Location: Left Arm)   Pulse (!) 101   Temp 98.5 F (36.9 C) (Oral)   Resp 17   Ht 5\' 4"  (1.626 m)   Wt 218 lb (98.9 kg)   SpO2 99%   BMI 37.42 kg/m   Physical Exam Physical Exam  Nursing note and vitals reviewed. Constitutional: Well developed, well nourished, non-toxic, and in no acute distress Head: Normocephalic and atraumatic.  Mouth/Throat: Oropharynx is clear and moist.  Neck: Normal range of motion. Neck supple.  Cardiovascular: Normal rate and regular rhythm.   Pulmonary/Chest: Effort normal and breath sounds normal.  Abdominal: Soft. There is no tenderness. There is no rebound and no guarding.  Musculoskeletal: Normal range of motion.  Neurological: Alert, no facial droop, fluent speech, moves all extremities symmetrically Skin: Skin is warm and dry.  Psychiatric: Cooperative   ED Treatments / Results  Labs (all labs ordered are listed, but only abnormal results are displayed) Labs Reviewed  BASIC METABOLIC PANEL - Abnormal; Notable for the following:       Result Value   Sodium 133 (*)    Potassium 3.3 (*)    Chloride 95 (*)    Glucose, Bld 200 (*)    All other components within normal limits  CBC - Abnormal; Notable for the following:    WBC 12.2 (*)    All other components within  normal limits  I-STAT TROPOININ, ED    EKG  EKG Interpretation None       Radiology Dg Chest 2 View  Result Date: 05/17/2016 CLINICAL DATA:  Mid chest pain for 3 days. EXAM: CHEST  2 VIEW COMPARISON:  November 24, 2013 FINDINGS: A nodular density projects over the lateral right chest, likely associated with the EKG lead. No other nodules or masses. No pneumothorax. The heart, hila, and mediastinum are normal. No acute infiltrate. IMPRESSION: The nodular density projected over the lateral right upper chest is likely associated with the EKG lead. A repeat film with the EKG leads removed before discharge could confirm. No acute abnormalities. Electronically Signed   By: Gerome Sam III M.D   On: 05/17/2016 09:29    Procedures Procedures (including critical care time)  Medications Ordered in ED Medications  aspirin chewable tablet 324 mg (324 mg Oral Given 05/17/16 0847)  gi cocktail (Maalox,Lidocaine,Donnatal) (30 mLs Oral Given 05/17/16 0847)     Initial Impression / Assessment and Plan / ED Course  I have reviewed the triage vital signs and the nursing  notes.  Pertinent labs & imaging results that were available during my care of the patient were reviewed by me and considered in my medical decision making (see chart for details).     Presenting with chest pain, now resolved.  EKG non-ischemic and without heart strain. Initial troponin normal. CXR visualized and shows no acute cardiopulmonary processes.  History not suggestive of PE, dissection, esophageal perforation or other serious intrathoracic processes.  Parts of story seems atypical for ACS and may be GI related such as GERD/indigestion. However, she has heart score of 4. Plan to admit for observation for chest pain rule out.   Final Clinical Impressions(s) / ED Diagnoses   Final diagnoses:  Nonspecific chest pain    New Prescriptions New Prescriptions   No medications on file     Lavera GuiseLiu, Dana Duo, MD 05/17/16  1011

## 2016-05-17 NOTE — H&P (Signed)
History and Physical    Lindsay [name] WUJ:[id number] DOB: 10-Oct-1945 DOA: 05/17/2016  PCP: Rometta EmeryGarba, Mohammad L, MD Patient coming from: home  Chief Complaint: chest pain/abdominal pain  HPI: Lindsay [name] is a pleasant 71 y.o. female with medical history significant for recurring UTI,  hypertension, hyperlipidemia, COPD, GERD, obesity presents to the emergency department chief complaint of chest pain. She is being admitted for rule out.  Information is obtained from the patient. She reports a 2 day history of intermittent chest pain. She stated speaking in to go she developed severe central chest pain after [place] for dinner. She describes the pain as intermittent dull, located epigastric area and non-radiating. She took some Tums and belch several times and experienced some improvement in the pain. She continued to have similar episodes for the next 2 days. Associated symptoms include diaphoresis shortness of breath nausea without vomiting. She also reports she drank some ginger ale which again led to belching and improvement of symptoms. She denies headache visual disturbances numbness tingling of extremities. She denies palpitations lower extremity edema or orthopnea. She denies fever chills cough abdominal pain diarrhea constipation. She does report persistent/recurrent urinary tract infection has been on Macrobid for 7 days and is prescribed for another 30 days.    ED Course: In the emergency department she's afebrile hemodynamically stable and not hypoxic. She is provided with GI cocktail, 324 mg of aspirin. At time of admission she is pain-free  Review of Systems: As per HPI otherwise all other systems reviewed and are negative.   Ambulatory Status: She ambulates with a cane denies any recent falling lives alone independent with ADLs on disability  Past Medical History:  Diagnosis Date  . Acid reflux   . COPD (chronic obstructive pulmonary disease) (HCC)   .  Hyperglycemia   . Hypokalemia   . Obesity   . Spinal stenosis   . Urinary tract infection, chronic     History reviewed. No pertinent surgical history.  Social History   Social History  . Marital status: Single    Spouse name: N/A  . Number of children: N/A  . Years of education: N/A   Occupational History  . Not on file.   Social History Main Topics  . Smoking status: Never Smoker  . Smokeless tobacco: Never Used  . Alcohol use No  . Drug use: No  . Sexual activity: Not on file   Other Topics Concern  . Not on file   Social History Narrative  . No narrative on file   Lives at home alone.  Allergies  Allergen Reactions  . Codeine Nausea Only  . Morphine And Related Itching    Family History  Problem Relation Age of Onset  . CAD Maternal Grandmother   . Microcephaly Maternal Grandmother     Prior to Admission medications   Medication Sig Start Date End Date Taking? Authorizing Provider  cyclobenzaprine (FLEXERIL) 10 MG tablet Take 10 mg by mouth 2 (two) times daily. 02/27/16  Yes [provider]  ferrous sulfate 325 (65 FE) MG tablet Take 325 mg by mouth daily. 03/05/16  Yes [provider]  hydrochlorothiazide (HYDRODIURIL) 12.5 MG tablet Take 12.5 mg by mouth daily.   Yes [provider]  nitrofurantoin, macrocrystal-monohydrate, (MACROBID) 100 MG capsule Take 100 mg by mouth daily. Take one capsule by mouth twice da 05/06/16  Yes [provider]  oxybutynin (DITROPAN-XL) 10 MG 24 hr tablet Take 10 mg by mouth daily. 04/25/16  Yes [provider]  RA STOOL SOFTENER 100 MG capsule Take 100 mg by mouth daily. 03/11/16  Yes [provider]  albuterol (PROVENTIL HFA;VENTOLIN HFA) 108 (90 BASE) MCG/ACT inhaler Inhale 1-2 puffs into the lungs every 6 (six) hours as needed for wheezing or shortness of breath.    [provider]    Physical Exam: Vitals:   05/17/16 0750 05/17/16 0752 05/17/16 0753 05/17/16  0831  BP:   115/88   Pulse: (!) 101     Resp: 17     Temp: 98.5 F (36.9 C)     TempSrc: Oral     SpO2: 98%   99%  Weight:  98.9 kg (218 lb)    Height:  5\' 4"  (1.626 m)       General:  Appears calm and comfortable, sitting up in bed no acute distress Eyes:  PERRL, EOMI, normal lids, iris ENT:  grossly normal hearing, lips & tongue, mucous membranes of her mouth are moist and pink Neck:  no LAD, masses or thyromegaly Cardiovascular:  RRR,  heart sounds slightly distant, no m/r/g. No LE edema. Pedal pulses present and palpable Respiratory:  CTA bilaterally, no w/r/r. Normal respiratory effort. Abdomen:  soft, ntnd, positive bowel sounds no guarding or rebounding Skin:  no rash or induration seen on limited exam Musculoskeletal:  grossly normal tone BUE/BLE, good ROM, no bony abnormality Psychiatric:  grossly normal mood and affect, speech fluent and appropriate, AOx3 Neurologic:  CN 2-12 grossly intact, moves all extremities in coordinated fashion, sensation intact  Labs on Admission: I have personally reviewed following labs and imaging studies  CBC:  Recent Labs Lab 05/17/16 0801  WBC 12.2*  HGB 12.7  HCT 39.0  MCV 82.1  PLT 376   Basic Metabolic Panel:  Recent Labs Lab 05/17/16 0801  NA 133*  K 3.3*  CL 95*  CO2 27  GLUCOSE 200*  BUN 14  CREATININE 0.89  CALCIUM 9.9   GFR: Estimated Creatinine Clearance: 69.1 mL/min (by C-G formula based on SCr of 0.89 mg/dL). Liver Function Tests: No results for input(s): AST, ALT, ALKPHOS, BILITOT, PROT, ALBUMIN in the last 168 hours. No results for input(s): LIPASE, AMYLASE in the last 168 hours. No results for input(s): AMMONIA in the last 168 hours. Coagulation Profile: No results for input(s): INR, PROTIME in the last 168 hours. Cardiac Enzymes: No results for input(s): CKTOTAL, CKMB, CKMBINDEX, TROPONINI in the last 168 hours. BNP (last 3 results) No results for input(s): PROBNP in the last 8760  hours. HbA1C: No results for input(s): HGBA1C in the last 72 hours. CBG: No results for input(s): GLUCAP in the last 168 hours. Lipid Profile:  Recent Labs  05/17/16 1000  CHOL 204*  HDL 35*  LDLCALC 156*  TRIG 64  CHOLHDL 5.8   Thyroid Function Tests: No results for input(s): TSH, T4TOTAL, FREET4, T3FREE, THYROIDAB in the last 72 hours. Anemia Panel: No results for input(s): VITAMINB12, FOLATE, FERRITIN, TIBC, IRON, RETICCTPCT in the last 72 hours. Urine analysis:    Component Value Date/Time   COLORURINE YELLOW 08/07/2006 1330   APPEARANCEUR CLOUDY (A) 08/07/2006 1330   LABSPEC 1.010 10/07/2007 1222   PHURINE 5.5 10/07/2007 1222   GLUCOSEU NEGATIVE 10/07/2007 1222   HGBUR MODERATE (A) 10/07/2007 1222   BILIRUBINUR NEGATIVE 10/07/2007 1222   KETONESUR NEGATIVE 10/07/2007 1222   PROTEINUR 100 (A) 10/07/2007 1222   UROBILINOGEN 0.2 10/07/2007 1222   NITRITE POSITIVE (A) 10/07/2007 1222   LEUKOCYTESUR (A) 10/07/2007 1222  MODERATE Biochemical Testing Only. Please order routine urinalysis from main lab if confirmatory testing is needed.    Creatinine Clearance: Estimated Creatinine Clearance: 69.1 mL/min (by C-G formula based on SCr of 0.89 mg/dL).  Sepsis Labs: @LABRCNTIP (procalcitonin:4,lacticidven:4) )No results found for this or any previous visit (from the past 240 hour(s)).   Radiological Exams on Admission: Dg Chest 2 View  Result Date: 05/17/2016 CLINICAL DATA:  Mid chest pain for 3 days. EXAM: CHEST  2 VIEW COMPARISON:  November 24, 2013 FINDINGS: A nodular density projects over the lateral right chest, likely associated with the EKG lead. No other nodules or masses. No pneumothorax. The heart, hila, and mediastinum are normal. No acute infiltrate. IMPRESSION: The nodular density projected over the lateral right upper chest is likely associated with the EKG lead. A repeat film with the EKG leads removed before discharge could confirm. No acute abnormalities.  Electronically Signed   By: Gerome Sam III M.D   On: 05/17/2016 09:29    EKG: Independently reviewed. Normal sinus rhythm Normal ECG  Assessment/Plan Principal Problem:   Chest pain Active Problems:   Obesity   COPD (chronic obstructive pulmonary disease) (HCC)   Acid reflux   Urinary tract infection, chronic   Hyperglycemia   Hypokalemia  #1. Chest pain. Atypical and typical features. Heart score 4. Initial workup reassuring. Pain-free on admission. Suspect mostly GI related. -Admit to telemetry -Cycle troponins -Serial EKG -Lipid panel -Continue aspirin -Patient has been prescribed a statin in the past but noncompliant -Follow-up with PCP if rules out, may benefit from outpatient stress test  #2. Acid reflux/GERD. Presentation similar. Improved with belching and GI cocktail. -Continue GI cocktail -prn maalox -PPI  #3. Hypokalemia. Mild. Potassium 3.3. Likely related to hydrochlorothiazide -Replete -Gentle IV fluids -Hold hydrochlorothiazide for today -Recheck in the morning  #4. Hyperglycemia. Glucose 200. She reports she has been counseled in the past for "prediabetes" -We will obtain a hemoglobin A1c -Use sliding scale insulin for optimal control -Will need close outpatient follow-up with PCP  #5. Hypertension. Controlled in the emergency department. Home medications include hydrochlorothiazide -Hold hydrochlorothiazide for today -Monitor  #6. COPD. Stable at baseline. Chest x-ray reveals nodular density projected over the lateral right upper chest is likely associated with the EKG lead. A repeat film with the EKG leads removed before discharge could confirm. No acute abnormalities. -continue home meds -monitor oxygen saturation level  #7. Obesity. BMI 37.1 -OP consult   DVT prophylaxis: lovenox  Code Status: full  Family Communication: none present  Disposition Plan: home hopefully tomorrow  Consults called: none Admission status: obs     Toya Smothers M MD [facility]  If 7PM-7AM, please contact night-coverage [email or web address] Password [email or web address]  05/17/2016, 10:50 AM

## 2016-05-17 NOTE — ED Triage Notes (Signed)
Pt. Stated, I've had bad chest pain since Thursday , I think cause I ate [place] sausages, and both arms aching. . I've had sweats and chills.

## 2016-05-18 ENCOUNTER — Other Ambulatory Visit (HOSPITAL_COMMUNITY): Payer: Self-pay

## 2016-05-18 ENCOUNTER — Observation Stay (HOSPITAL_COMMUNITY): Payer: Medicare Other

## 2016-05-18 ENCOUNTER — Encounter (HOSPITAL_COMMUNITY): Payer: Self-pay

## 2016-05-18 ENCOUNTER — Encounter (HOSPITAL_COMMUNITY): Payer: Self-pay | Admitting: Physician Assistant

## 2016-05-18 DIAGNOSIS — K219 Gastro-esophageal reflux disease without esophagitis: Secondary | ICD-10-CM | POA: Diagnosis not present

## 2016-05-18 DIAGNOSIS — R0600 Dyspnea, unspecified: Secondary | ICD-10-CM | POA: Diagnosis not present

## 2016-05-18 DIAGNOSIS — E876 Hypokalemia: Secondary | ICD-10-CM | POA: Diagnosis not present

## 2016-05-18 DIAGNOSIS — R079 Chest pain, unspecified: Secondary | ICD-10-CM

## 2016-05-18 DIAGNOSIS — R072 Precordial pain: Secondary | ICD-10-CM | POA: Diagnosis not present

## 2016-05-18 DIAGNOSIS — J449 Chronic obstructive pulmonary disease, unspecified: Secondary | ICD-10-CM | POA: Diagnosis not present

## 2016-05-18 DIAGNOSIS — N390 Urinary tract infection, site not specified: Secondary | ICD-10-CM

## 2016-05-18 DIAGNOSIS — Z79899 Other long term (current) drug therapy: Secondary | ICD-10-CM | POA: Diagnosis not present

## 2016-05-18 LAB — LIPASE, BLOOD: LIPASE: 16 U/L (ref 11–51)

## 2016-05-18 LAB — GLUCOSE, CAPILLARY
GLUCOSE-CAPILLARY: 170 mg/dL — AB (ref 65–99)
GLUCOSE-CAPILLARY: 137 mg/dL — AB (ref 65–99)
Glucose-Capillary: 216 mg/dL — ABNORMAL HIGH (ref 65–99)

## 2016-05-18 LAB — BASIC METABOLIC PANEL
Anion gap: 15 (ref 5–15)
BUN: 17 mg/dL (ref 6–20)
CO2: 22 mmol/L (ref 22–32)
CREATININE: 1.02 mg/dL — AB (ref 0.44–1.00)
Calcium: 9.5 mg/dL (ref 8.9–10.3)
Chloride: 99 mmol/L — ABNORMAL LOW (ref 101–111)
GFR, EST NON AFRICAN AMERICAN: 55 mL/min — AB (ref >60)
Glucose, Bld: 143 mg/dL — ABNORMAL HIGH (ref 65–99)
POTASSIUM: 4 mmol/L (ref 3.5–5.1)
SODIUM: 136 mmol/L (ref 135–145)

## 2016-05-18 LAB — RAPID URINE DRUG SCREEN, HOSP PERFORMED
Amphetamines: NOT DETECTED
BARBITURATES: NOT DETECTED
Benzodiazepines: NOT DETECTED
COCAINE: NOT DETECTED
Opiates: NOT DETECTED
Tetrahydrocannabinol: NOT DETECTED

## 2016-05-18 LAB — HEPATIC FUNCTION PANEL
ALBUMIN: 3.5 g/dL (ref 3.5–5.0)
ALT: 13 U/L — ABNORMAL LOW (ref 14–54)
AST: 16 U/L (ref 15–41)
Alkaline Phosphatase: 66 U/L (ref 38–126)
Bilirubin, Direct: 0.2 mg/dL (ref 0.1–0.5)
Indirect Bilirubin: 0.4 mg/dL (ref 0.3–0.9)
Total Bilirubin: 0.6 mg/dL (ref 0.3–1.2)
Total Protein: 8 g/dL (ref 6.5–8.1)

## 2016-05-18 LAB — HEMOGLOBIN A1C
Hgb A1c MFr Bld: 7 % — ABNORMAL HIGH (ref 4.8–5.6)
Mean Plasma Glucose: 154 mg/dL

## 2016-05-18 MED ORDER — PANTOPRAZOLE SODIUM 20 MG PO TBEC: 20.0000 mg | DELAYED_RELEASE_TABLET | Freq: Every day | ORAL | 0 refills | Status: DC

## 2016-05-18 MED ORDER — GI COCKTAIL ~~LOC~~
30.0000 mL | Freq: Once | ORAL | Status: DC
  Filled 2016-05-18: qty 30

## 2016-05-18 MED ORDER — IOPAMIDOL (ISOVUE-370) INJECTION 76%
INTRAVENOUS | Status: AC
  Administered 2016-05-18: 100 mL
  Filled 2016-05-18: qty 100

## 2016-05-18 MED ORDER — ALBUTEROL SULFATE (2.5 MG/3ML) 0.083% IN NEBU
2.5000 mg | INHALATION_SOLUTION | RESPIRATORY_TRACT | Status: DC | PRN
  Administered 2016-05-18 (×2): 2.5 mg via RESPIRATORY_TRACT
  Filled 2016-05-18 (×2): qty 3

## 2016-05-18 NOTE — Consult Note (Signed)
Cardiology Consultation Note    Patient ID: Lindsay [name], MRN: [id number], DOB/AGE: 69-Jul-1947 69 y.o. Admit date: 05/17/2016   Date of Consult: 05/18/2016 Primary Physician: Rometta EmeryGarba, Mohammad L, MD Primary Cardiologist: New   Chief Complaint: chest, shoulder pain Reason for Consultation: chest pain Requesting MD: chest pain unit consult (Dr. Jarvis NewcomerGrunz)  HPI: Lindsay [name] is a 71 y.o. female with history of HTN, HLD, COPD, GERD, obesity, asthma, spinal stenosis, recurrent UTI, hyperglycemia (being followed for possible DM) whom we are asked to see for chest pain by Dr. Jarvis NewcomerGrunz. She is somewhat of a difficult historian and tangential at times. She reports to me that she's been feeling "bad" for the past 1 week with intermittent aching all over, particularly across her upper back/shoulders. At some point during the last 2 days she began developing a sensation of chest soreness worse with palpation. She told admitting team that this started after eating [place] sausage for dinner but when I asked her about this she denied it and said "I didn't may much attention to it when it started." She describes the symptoms as aching. Due to persistent symptoms and feeling progressively "bad" (which she has a hard time describing), she presented to the ED yesterday for evaluation. She then states yesterday she felt pretty good without any particular relieving factor that she carried out but last night again began having the upper back/shoulder/arm discomfort. She states she was given medicine very late which helped ease her symptoms and allowed her to sleep. She was given albuterol and flexeril per med list. She did notice some sweating overnight. She also has chronic DOE which she attributes to her asthma, thinks it may have been worse lately. This morning she has recurrent discomfort mostly across her shoulders. Pain is not worse with inspiration or exertion although she states she's not tried to exert herself since  this began. Her discomfort can last hours at a time. Troponins neg x 3. Initial EKG unrevealing but subsequent tracing shows nonspecific ST-T changes and slightly prolonged QT. She denies any chest pain at present time, but continues to state she "just feels so bad." LDL 156, WBC 12.2, initial K 3.3 -> 4.0 (after Kcl), Mg 2.2, presenting CBG 200. HR 95-110 on telemetry; prior HR 68 in 2015. Pulse ox intermittently ringing for upper 80s with reasonable waveform, last pulse ox 94% on RA. No palpitations, syncope, orthopnea or weight gain/loss. She does report persistent/recurrent urinary tract infection has been on Macrobid for 7 days and is prescribed for another 30 days.    Past Medical History:  Diagnosis Date  . Acid reflux   . Asthma   . COPD (chronic obstructive pulmonary disease) (HCC)   . Hyperglycemia   . Hypokalemia   . Obesity   . Spinal stenosis   . Urinary tract infection, chronic       Surgical History: History reviewed. No pertinent surgical history.   Home Meds: Prior to Admission medications   Medication Sig Start Date End Date Taking? Authorizing Provider  cyclobenzaprine (FLEXERIL) 10 MG tablet Take 10 mg by mouth 2 (two) times daily. 02/27/16  Yes [provider]  ferrous sulfate 325 (65 FE) MG tablet Take 325 mg by mouth daily. 03/05/16  Yes [provider]  hydrochlorothiazide (HYDRODIURIL) 12.5 MG tablet Take 12.5 mg by mouth daily.   Yes [provider]  nitrofurantoin, macrocrystal-monohydrate, (MACROBID) 100 MG capsule Take 100 mg by mouth daily. Take one capsule by mouth twice da 05/06/16  Yes [provider]  oxybutynin (DITROPAN-XL) 10 MG 24 hr tablet Take 10 mg by mouth daily. 04/25/16  Yes [provider]  RA STOOL SOFTENER 100 MG capsule Take 100 mg by mouth daily. 03/11/16  Yes [provider]  albuterol (PROVENTIL HFA;VENTOLIN HFA) 108 (90 BASE) MCG/ACT inhaler Inhale 1-2 puffs into the lungs every 6 (six) hours  as needed for wheezing or shortness of breath.    [provider]    Inpatient Medications:  . aspirin  325 mg Oral Daily  . calcium carbonate  1 tablet Oral TID WC  . cyclobenzaprine  10 mg Oral BID  . docusate sodium  100 mg Oral Daily  . enoxaparin (LOVENOX) injection  40 mg Subcutaneous Q24H  . ferrous sulfate  325 mg Oral Daily  . hydrochlorothiazide  12.5 mg Oral Daily  . insulin aspart  0-5 Units Subcutaneous QHS  . insulin aspart  0-9 Units Subcutaneous TID WC  . nitrofurantoin (macrocrystal-monohydrate)  100 mg Oral Q12H  . pantoprazole  20 mg Oral Daily     Allergies:  Allergies  Allergen Reactions  . Codeine Nausea Only  . Morphine And Related Itching    Social History   Social History  . Marital status: Single    Spouse name: N/A  . Number of children: N/A  . Years of education: N/A   Occupational History  . Not on file.   Social History Main Topics  . Smoking status: Never Smoker  . Smokeless tobacco: Never Used  . Alcohol use No  . Drug use: No  . Sexual activity: Not on file   Other Topics Concern  . Not on file   Social History Narrative  . No narrative on file     Family History  Problem Relation Age of Onset  . Microcephaly Maternal Grandmother   . Heart disease Maternal Grandmother        Not clear details, patient just knows she took "heart pills"     Review of Systems: see above. All other systems reviewed and are otherwise negative except as noted above.  Labs:  Recent Labs  05/17/16 1208 05/17/16 1838  TROPONINI <0.03 <0.03   Lab Results  Component Value Date   WBC 12.2 (H) 05/17/2016   HGB 12.7 05/17/2016   HCT 39.0 05/17/2016   MCV 82.1 05/17/2016   PLT 376 05/17/2016    Recent Labs Lab 05/18/16 0621  NA 136  K 4.0  CL 99*  CO2 22  BUN 17  CREATININE 1.02*  CALCIUM 9.5  GLUCOSE 143*   Lab Results  Component Value Date   CHOL 204 (H) 05/17/2016   HDL 35 (L) 05/17/2016   LDLCALC 156 (H)  05/17/2016   TRIG 64 05/17/2016   No results found for: DDIMER  Radiology/Studies:  Dg Chest 2 View  Result Date: 05/17/2016 CLINICAL DATA:  Mid chest pain for 3 days. EXAM: CHEST  2 VIEW COMPARISON:  November 24, 2013 FINDINGS: A nodular density projects over the lateral right chest, likely associated with the EKG lead. No other nodules or masses. No pneumothorax. The heart, hila, and mediastinum are normal. No acute infiltrate. IMPRESSION: The nodular density projected over the lateral right upper chest is likely associated with the EKG lead. A repeat film with the EKG leads removed before discharge could confirm. No acute abnormalities. Electronically Signed   By: Gerome Sam III M.D   On: 05/17/2016 09:29    Wt Readings from Last 3 Encounters:  05/18/16 219 lb 9.6 oz (99.6 kg)    EKG: NSR 98bpm no acute changes Today:  Physical Exam: Blood pressure 130/80, pulse 100, temperature 99 F (37.2 C), temperature source Oral, resp. rate 16, height 5\' 4"  (1.626 m), weight 219 lb 9.6 oz (99.6 kg), SpO2 92 %. Body mass index is 37.69 kg/m. General: Well developed, well nourished, in no acute distress. Head: Normocephalic, atraumatic, sclera non-icteric, no xanthomas, nares are without discharge.  Neck: Negative for carotid bruits. JVD not elevated. Lungs: Clear bilaterally to auscultation without wheezes, rales, or rhonchi. Breathing is unlabored. Heart: RRR with S1 S2. No murmurs, rubs, or gallops appreciated. Abdomen: Soft, non-tender, non-distended with normoactive bowel sounds. No hepatomegaly. No rebound/guarding. No obvious abdominal masses. Msk:  Strength and tone appear normal for age. Extremities: No clubbing or cyanosis. No edema.  Distal pedal pulses are 2+ and equal bilaterally. Neuro: Alert and oriented X 3. No facial asymmetry. No focal deficit. Moves all extremities spontaneously. Psych:  Responds to questions appropriately with a normal affect.     Assessment and Plan    1. Diffuse myalgias along with atypical chest pain, mild sinus tach and intermittent hypoxia on telemetry - symptoms very atypical for coronary ischemia but EKG is with nonspecific changes this AM. Chest wall is only tender to palpation, and she is not complaining of this otherwise. Her salient complaint is generally feeling "bad all over my body." Defer consideration of other general medical issues including r/o PE to primary team. No evidence of dissection (normal pulses, normal cardiomediastinal silhouette). Troponins thus far negative despite several hours of pain. Will review further workup with Dr. Donnie Aho.  2. Nonspecific EKG changes with QT prolongation this AM - see above. K and Mg are normal this AM.  3. Essential HTN - currently controlled.  4. Leukocytosis - further eval per IM.  5. Borderline sinus tach - suspect reactive to an underlying general medical issue.  Signed, Laurann Montana PA-C 05/18/2016, 8:43 AM Pager: [phone]

## 2016-05-18 NOTE — Progress Notes (Signed)
Patient Saturations on Room Air at Rest = 95%  Patient Saturations on Room Air while Ambulating = 91% 

## 2016-05-18 NOTE — Discharge Instructions (Signed)

## 2016-05-18 NOTE — Discharge Summary (Signed)
Physician Discharge Summary  [name]:[id number] DOB: 12-09-1945 DOA: 05/17/2016  PCP: Rometta Emery, MD  Admit date: 05/17/2016 Discharge date: 05/18/2016  Admitted From: Home Disposition: Home   Recommendations for Outpatient Follow-up:  1. Follow up with PCP in 1-2 weeks 2. HbA1c was 7%, will require medications and appropriate screening.  3. Monitor BMP. Pt is on HCTZ and had hypokalemia. 4. Follow up non-contrast CT chest recommended in 3 months to test for persistence of pulmonary nodules seen incidentally on CT 5/13 (see below).  Home Health: None Equipment/Devices: None Discharge Condition: Stable CODE STATUS: Full Diet recommendation: Heart healthy  Brief/Interim Summary: Lindsay [name] is a pleasant 71 y.o. female with medical history significant for recurring UTI,  hypertension, hyperlipidemia, COPD, GERD, obesity presents to the emergency department chief complaint of chest pain. She was admitted for rule out. ECG remained nonischemic and enzymes remained negative. Cardiology recommended no further work up. Further history as below:  Information is obtained from the patient. She reports a 2 day history of intermittent chest pain. She stated speaking in to go she developed severe central chest pain after eating [place] sausage for dinner. She describes the pain as intermittent dull, located epigastric area and non-radiating. She took some Tums and belch several times and experienced some improvement in the pain. She continued to have similar episodes for the next 2 days. Associated symptoms include diaphoresis shortness of breath nausea without vomiting. She also reports she drank some ginger ale which again led to belching and improvement of symptoms. She denies headache visual disturbances numbness tingling of extremities. She denies palpitations lower extremity edema or orthopnea. She denies fever chills cough abdominal pain diarrhea constipation. She does report  persistent/recurrent urinary tract infection has been on Macrobid for 7 days and is prescribed for another 30 days.  In the emergency department she's afebrile hemodynamically stable and not hypoxic. She is provided with GI cocktail, 324 mg of aspirin. At time of admission she is pain-free.  Discharge Diagnoses:  Principal Problem:   Chest pain Active Problems:   Obesity   COPD (chronic obstructive pulmonary disease) (HCC)   Acid reflux   Urinary tract infection, chronic   Hyperglycemia   Hypokalemia  Chest pain: With RFs (obesity, HTN, and now DM) Worse with palpation, suspected most likely due to MSK etiology. Troponins negative and ECG nonischemic. Evaluated by cardiology who did not recommend ischemic testing at this time. CT chest showed no infiltrate, edema, thrombus or dissection. Pt had some subjective dyspnea while admitted but did not require oxygen. - Patient has been prescribed a statin in the past but noncompliant. Follow up with PCP recommended.   GERD: Suspected cause of upper abdominal pain with otherwise negative labs. Improved with belching and GI cocktail. No dysphagia, weight loss.  - Therapeutic trial of protonix, consider GI referral if continues.   Pulmonary nodules: Incidentally seen on CT chest (see below) - Follow up non-contrast CT chest recommended in 3 months to test for persistence - Patient informed  Hypokalemia. Mild. Potassium 3.3. Likely related to hydrochlorothiazide -Resolved  Diabetes mellitus: Glucose 200. She reports she has been counseled in the past for "prediabetes" and HbA1c was found to be 7% (resulted after pt was discharged).  - Needs close outpatient follow up.   Hypertension. Normotensive while admitted. HCTZ was held due to hypokalemia.   COPD. Stable at baseline. - Continued home medications - Remained >90% while ambulating, no wheezing noted.  Obesity: BMI 37.1 - Dietary modifications  reviewed.  Discharge  Instructions Discharge Instructions    Discharge instructions    Complete by:  As directed    You were admitted with chest pain not thought to be due to your heart. The work up has been negative. The CT scan of your chest showed no pneumonia, no blood clots, no damage to your arteries. The pain in the lower chest/upper abdomen may be due to reflux, and so it is recommended that you take protonix once daily (starting tomorrow). This has been sent to your pharmacy. You will need to follow up with Dr. Mikeal [name] in the next week to monitor your symptoms. If you experience acute worsening of chest pain or fever, seek medical attention.     Allergies as of 05/18/2016      Reactions   Codeine Nausea Only   Morphine And Related Itching      Medication List    TAKE these medications   albuterol 108 (90 Base) MCG/ACT inhaler Commonly known as:  PROVENTIL HFA;VENTOLIN HFA Inhale 1-2 puffs into the lungs every 6 (six) hours as needed for wheezing or shortness of breath.   cyclobenzaprine 10 MG tablet Commonly known as:  FLEXERIL Take 10 mg by mouth 2 (two) times daily.   ferrous sulfate 325 (65 FE) MG tablet Take 325 mg by mouth daily.   hydrochlorothiazide 12.5 MG tablet Commonly known as:  HYDRODIURIL Take 12.5 mg by mouth daily.   nitrofurantoin (macrocrystal-monohydrate) 100 MG capsule Commonly known as:  MACROBID Take 100 mg by mouth daily. Take one capsule by mouth twice da   oxybutynin 10 MG 24 hr tablet Commonly known as:  DITROPAN-XL Take 10 mg by mouth daily.   pantoprazole 20 MG tablet Commonly known as:  PROTONIX Take 1 tablet (20 mg total) by mouth daily. Start taking on:  05/19/2016   RA STOOL SOFTENER 100 MG capsule Generic drug:  docusate sodium Take 100 mg by mouth daily.      Follow-up Information    Rometta Emery, MD Follow up.   Specialty:  Internal Medicine Contact information: [address] [place] [address] [phone]          Allergies   Allergen Reactions  . Codeine Nausea Only  . Morphine And Related Itching    Consultations:  Cardiology, Dr. Donnie Aho  Procedures/Studies: Dg Chest 2 View  Result Date: 05/17/2016 CLINICAL DATA:  Mid chest pain for 3 days. EXAM: CHEST  2 VIEW COMPARISON:  November 24, 2013 FINDINGS: A nodular density projects over the lateral right chest, likely associated with the EKG lead. No other nodules or masses. No pneumothorax. The heart, hila, and mediastinum are normal. No acute infiltrate. IMPRESSION: The nodular density projected over the lateral right upper chest is likely associated with the EKG lead. A repeat film with the EKG leads removed before discharge could confirm. No acute abnormalities. Electronically Signed   By: Gerome Sam III M.D   On: 05/17/2016 09:29   Ct Angio Chest Pe W Or Wo Contrast  Result Date: 05/18/2016 CLINICAL DATA:  Chest pain, shortness of breath. EXAM: CT ANGIOGRAPHY CHEST WITH CONTRAST TECHNIQUE: Multidetector CT imaging of the chest was performed using the standard protocol during bolus administration of intravenous contrast. Multiplanar CT image reconstructions and MIPs were obtained to evaluate the vascular anatomy. CONTRAST:  100 cc Isovue 370 COMPARISON:  None. FINDINGS: Cardiovascular: Some of the most peripheral segmental and subsegmental pulmonary arteries are difficult to definitively characterize due to patient breathing motion artifact, however,  there is no pulmonary embolism identified within the main, lobar or central segmental pulmonary arteries bilaterally. No aortic aneurysm or dissection. Heart size is normal. No pericardial effusion. Mediastinum/Nodes: No mass or enlarged lymph nodes seen within the mediastinum or perihilar regions. Esophagus appears normal. Trachea and central bronchi are unremarkable. Lungs/Pleura: Mild scarring/fibrosis or atelectasis at the right lung base. Two sub solid nodules are seen within the posterior aspects of the right  lung, superior segment of the right lower lobe, each measuring approximately 11 mm greatest dimension (Series 9, images 27 and 28). Lungs otherwise clear. No evidence of pneumonia. No pulmonary edema. No pleural effusion or pneumothorax. Upper Abdomen: No acute abnormality. Musculoskeletal: Mild degenerative spurring throughout the slightly kyphotic thoracic spine. Benign hemangiomas within the upper and mid thoracic spine. No acute or suspicious osseous finding. Superficial soft tissues are unremarkable. Review of the MIP images confirms the above findings. IMPRESSION: 1. No pulmonary embolism seen, with mild study limitations detailed above. 2. No aortic aneurysm or dissection. 3. Heart size is normal.  No pericardial effusion. 4. Two sub solid nodules within the superior segment of the right lower lobe, each measuring approximately 11 mm greatest dimension. Initial follow-up by chest CT without contrast is recommended in 3 months to confirm persistence. This recommendation follows the consensus statement: Recommendations for the Management of Subsolid Pulmonary Nodules Detected at CT: A Statement from the Fleischner Society as published in Radiology 2013; 266:304-317. Lungs otherwise clear. No evidence of pneumonia or pulmonary edema. Electronically Signed   By: Bary Richard M.D.   On: 05/18/2016 12:59   Subjective: Pt with improved chest pain and dyspnea. Still has nagging fleeting epigastric pains.   Discharge Exam: Vitals:   05/18/16 0500 05/18/16 1520  BP: 130/80 123/77  Pulse: 100 91  Resp: 16 16  Temp: 99 F (37.2 C) 98.7 F (37.1 C)   General: Pt is alert, awake, not in acute distress  Cardiovascular: RRR, S1/S2 +, no murmurs, no rubs, no gallops. No JVD or LE edema. Tenderness to palpation of chest wall diffusely. Respiratory: Nonlabored, clear Abdominal: Soft, NT, ND, bowel sounds +. No HSM.   The results of significant diagnostics from this hospitalization (including imaging,  microbiology, ancillary and laboratory) are listed below for reference.    Labs: BNP (last 3 results) No results for input(s): BNP in the last 8760 hours. Basic Metabolic Panel:  Recent Labs Lab 05/17/16 0801 05/17/16 1208 05/18/16 0621  NA 133*  --  136  K 3.3*  --  4.0  CL 95*  --  99*  CO2 27  --  22  GLUCOSE 200*  --  143*  BUN 14  --  17  CREATININE 0.89  --  1.02*  CALCIUM 9.9  --  9.5  MG  --  2.2  --   PHOS  --  3.5  --    Liver Function Tests:  Recent Labs Lab 05/18/16 1137  AST 16  ALT 13*  ALKPHOS 66  BILITOT 0.6  PROT 8.0  ALBUMIN 3.5    Recent Labs Lab 05/18/16 1138  LIPASE 16   No results for input(s): AMMONIA in the last 168 hours. CBC:  Recent Labs Lab 05/17/16 0801  WBC 12.2*  HGB 12.7  HCT 39.0  MCV 82.1  PLT 376   Cardiac Enzymes:  Recent Labs Lab 05/17/16 1208 05/17/16 1838  TROPONINI <0.03 <0.03   BNP: Invalid input(s): POCBNP CBG:  Recent Labs Lab 05/17/16 1807 05/17/16 2209 05/18/16 7564  05/18/16 1133 05/18/16 1635  GLUCAP 177* 146* 170* 216* 137*   D-Dimer No results for input(s): DDIMER in the last 72 hours. Hgb A1c  Recent Labs  05/17/16 1208  HGBA1C 7.0*   Lipid Profile  Recent Labs  05/17/16 1000  CHOL 204*  HDL 35*  LDLCALC 156*  TRIG 64  CHOLHDL 5.8   Thyroid function studies No results for input(s): TSH, T4TOTAL, T3FREE, THYROIDAB in the last 72 hours.  Invalid input(s): FREET3 Anemia work up No results for input(s): VITAMINB12, FOLATE, FERRITIN, TIBC, IRON, RETICCTPCT in the last 72 hours. Urinalysis    Component Value Date/Time   COLORURINE YELLOW 08/07/2006 1330   APPEARANCEUR CLOUDY (A) 08/07/2006 1330   LABSPEC 1.010 10/07/2007 1222   PHURINE 5.5 10/07/2007 1222   GLUCOSEU NEGATIVE 10/07/2007 1222   HGBUR MODERATE (A) 10/07/2007 1222   BILIRUBINUR NEGATIVE 10/07/2007 1222   KETONESUR NEGATIVE 10/07/2007 1222   PROTEINUR 100 (A) 10/07/2007 1222   UROBILINOGEN 0.2  10/07/2007 1222   NITRITE POSITIVE (A) 10/07/2007 1222   LEUKOCYTESUR (A) 10/07/2007 1222    MODERATE Biochemical Testing Only. Please order routine urinalysis from main lab if confirmatory testing is needed.    Microbiology No results found for this or any previous visit (from the past 240 hour(s)).  Time coordinating discharge: Approximately 40 minutes  Hazeline Junkeryan Makyle Eslick, MD  [facility] 05/18/2016, 5:17 PM Pager [phone]  If 7PM-7AM, please contact night-coverage [email or web address] Password [email or web address]

## 2016-10-02 ENCOUNTER — Other Ambulatory Visit: Payer: Self-pay | Admitting: Internal Medicine

## 2016-10-02 DIAGNOSIS — Z1231 Encounter for screening mammogram for malignant neoplasm of breast: Secondary | ICD-10-CM

## 2016-10-31 ENCOUNTER — Ambulatory Visit
Admission: RE | Admit: 2016-10-31 | Discharge: 2016-10-31 | Disposition: A | Discharge status: Home / Self Care | Payer: Medicare Other | Source: Ambulatory Visit | Attending: Internal Medicine | Admitting: Internal Medicine

## 2016-10-31 DIAGNOSIS — Z1231 Encounter for screening mammogram for malignant neoplasm of breast: Secondary | ICD-10-CM

## 2017-09-28 ENCOUNTER — Other Ambulatory Visit: Payer: Self-pay | Admitting: Internal Medicine

## 2017-09-28 DIAGNOSIS — Z1231 Encounter for screening mammogram for malignant neoplasm of breast: Secondary | ICD-10-CM

## 2017-10-17 ENCOUNTER — Emergency Department (HOSPITAL_COMMUNITY)
Admission: EM | Admit: 2017-10-17 | Discharge: 2017-10-17 | Disposition: A | Discharge status: Home / Self Care | Payer: Medicare Other | Attending: Emergency Medicine | Admitting: Emergency Medicine

## 2017-10-17 ENCOUNTER — Encounter (HOSPITAL_COMMUNITY): Payer: Self-pay | Admitting: *Deleted

## 2017-10-17 ENCOUNTER — Emergency Department (HOSPITAL_COMMUNITY): Payer: Medicare Other

## 2017-10-17 DIAGNOSIS — B9789 Other viral agents as the cause of diseases classified elsewhere: Secondary | ICD-10-CM

## 2017-10-17 DIAGNOSIS — J069 Acute upper respiratory infection, unspecified: Secondary | ICD-10-CM | POA: Insufficient documentation

## 2017-10-17 DIAGNOSIS — Z7984 Long term (current) use of oral hypoglycemic drugs: Secondary | ICD-10-CM | POA: Diagnosis not present

## 2017-10-17 DIAGNOSIS — Z79899 Other long term (current) drug therapy: Secondary | ICD-10-CM | POA: Diagnosis not present

## 2017-10-17 DIAGNOSIS — R05 Cough: Secondary | ICD-10-CM | POA: Diagnosis present

## 2017-10-17 DIAGNOSIS — Z7982 Long term (current) use of aspirin: Secondary | ICD-10-CM | POA: Insufficient documentation

## 2017-10-17 DIAGNOSIS — J441 Chronic obstructive pulmonary disease with (acute) exacerbation: Secondary | ICD-10-CM | POA: Diagnosis not present

## 2017-10-17 LAB — COMPREHENSIVE METABOLIC PANEL
ALT: 13 U/L (ref 0–44)
AST: 15 U/L (ref 15–41)
Albumin: 3.8 g/dL (ref 3.5–5.0)
Alkaline Phosphatase: 47 U/L (ref 38–126)
Anion gap: 9 (ref 5–15)
BUN: 16 mg/dL (ref 8–23)
CALCIUM: 9.8 mg/dL (ref 8.9–10.3)
CO2: 27 mmol/L (ref 22–32)
CREATININE: 0.87 mg/dL (ref 0.44–1.00)
Chloride: 108 mmol/L (ref 98–111)
GFR calc non Af Amer: >60 mL/min (ref >60)
Glucose, Bld: 91 mg/dL (ref 70–99)
Potassium: 3.6 mmol/L (ref 3.5–5.1)
SODIUM: 144 mmol/L (ref 135–145)
TOTAL PROTEIN: 7.2 g/dL (ref 6.5–8.1)
Total Bilirubin: 0.3 mg/dL (ref 0.3–1.2)

## 2017-10-17 LAB — CBC WITH DIFFERENTIAL/PLATELET
Abs Immature Granulocytes: 0.02 K/uL (ref 0.00–0.07)
Basophils Absolute: 0 K/uL (ref 0.0–0.1)
Basophils Relative: 1 %
Eosinophils Absolute: 0.4 K/uL (ref 0.0–0.5)
Eosinophils Relative: 6 %
HCT: 33.9 % — ABNORMAL LOW (ref 36.0–46.0)
Hemoglobin: 10.4 g/dL — ABNORMAL LOW (ref 12.0–15.0)
Immature Granulocytes: 0 %
Lymphocytes Relative: 29 %
Lymphs Abs: 1.9 K/uL (ref 0.7–4.0)
MCH: 26.1 pg (ref 26.0–34.0)
MCHC: 30.7 g/dL (ref 30.0–36.0)
MCV: 85.2 fL (ref 80.0–100.0)
Monocytes Absolute: 0.5 K/uL (ref 0.1–1.0)
Monocytes Relative: 7 %
Neutro Abs: 3.8 K/uL (ref 1.7–7.7)
Neutrophils Relative %: 57 %
Platelets: 315 K/uL (ref 150–400)
RBC: 3.98 MIL/uL (ref 3.87–5.11)
RDW: 14 % (ref 11.5–15.5)
WBC: 6.6 K/uL (ref 4.0–10.5)
nRBC: 0 % (ref 0.0–0.2)

## 2017-10-17 LAB — I-STAT TROPONIN, ED: Troponin i, poc: 0 ng/mL (ref 0.00–0.08)

## 2017-10-17 MED ORDER — IPRATROPIUM BROMIDE 0.02 % IN SOLN
0.5000 mg | Freq: Once | RESPIRATORY_TRACT | Status: AC
Start: 2017-10-17 — End: 2017-10-17
  Administered 2017-10-17: 0.5 mg via RESPIRATORY_TRACT
  Filled 2017-10-17: qty 2.5

## 2017-10-17 MED ORDER — SODIUM CHLORIDE 0.9 % IV BOLUS
1000.0000 mL | Freq: Once | INTRAVENOUS | Status: AC
  Administered 2017-10-17: 1000 mL via INTRAVENOUS

## 2017-10-17 MED ORDER — BENZONATATE 100 MG PO CAPS: 100.0000 mg | ORAL_CAPSULE | Freq: Three times a day (TID) | ORAL | 0 refills | Status: DC | PRN

## 2017-10-17 MED ORDER — ALBUTEROL (5 MG/ML) CONTINUOUS INHALATION SOLN
15.0000 mg/h | INHALATION_SOLUTION | Freq: Once | RESPIRATORY_TRACT | Status: AC
  Administered 2017-10-17: 15 mg/h via RESPIRATORY_TRACT
  Filled 2017-10-17: qty 20

## 2017-10-17 MED ORDER — PREDNISONE 20 MG PO TABS: ORAL_TABLET | ORAL | 0 refills | Status: DC

## 2017-10-17 MED ORDER — METHYLPREDNISOLONE SODIUM SUCC 125 MG IJ SOLR
125.0000 mg | Freq: Once | INTRAMUSCULAR | Status: AC
  Administered 2017-10-17: 125 mg via INTRAVENOUS
  Filled 2017-10-17: qty 2

## 2017-10-17 MED ORDER — AZITHROMYCIN 250 MG PO TABS: 250.0000 mg | ORAL_TABLET | Freq: Every day | ORAL | 0 refills | Status: DC

## 2017-10-17 NOTE — Discharge Instructions (Signed)
Take prednisone as prescribed.   Also take zpack as prescribed.   You may take tessalon pearls for cough.   Your prescriptions were sent to your pharmacy   Continue omeprazole   See your doctor this week   Return to ER if you have worse trouble breathing, shortness of breath, cough, fever.

## 2017-10-17 NOTE — ED Provider Notes (Signed)
[facility]-EMERGENCY DEPT Provider Note   CSN: [id number] Arrival date & time: 10/17/17  0940     History   Chief Complaint Chief Complaint  Patient presents with  . Cough    HPI Lindsay [name] is a 72 y.o. female hx of COPD, here presenting with cough, congestion.  Patient states that she has been coughing for the last 33 days.  Patient states that initially was nonproductive but then became more greenish.  Patient finished a course of prednisone about 2 weeks ago and states that it did not help her symptoms.  Patient saw her primary care doctor a week ago and had a normal chest x-ray and was thought to have acid reflux so started on omeprazole but symptoms actually got worse.  Denies any fevers but has more greenish sputum now.  Patient denies any recent travel history of blood clots.  Has remote history of admission for COPD.  She states that she is not a smoker.   The history is provided by the patient.    Past Medical History:  Diagnosis Date  . Acid reflux   . Asthma   . COPD (chronic obstructive pulmonary disease) (HCC)   . Hyperglycemia   . Hypokalemia   . Obesity   . Spinal stenosis   . Urinary tract infection, chronic     Patient Active Problem List   Diagnosis Date Noted  . Chest pain 05/17/2016  . Obesity 05/17/2016  . COPD (chronic obstructive pulmonary disease) (HCC)   . Acid reflux   . Urinary tract infection, chronic   . Hyperglycemia   . Hypokalemia   . COPD exacerbation (HCC) 01/24/2013    History reviewed. No pertinent surgical history.   OB History   None      Home Medications    Prior to Admission medications   Medication Sig Start Date End Date Taking? Authorizing Provider  albuterol (PROVENTIL HFA;VENTOLIN HFA) 108 (90 BASE) MCG/ACT inhaler Inhale 1-2 puffs into the lungs every 6 (six) hours as needed for wheezing or shortness of breath.   Yes [provider]  aspirin EC 81 MG tablet Take 81 mg by mouth  daily.   Yes [provider]  cetirizine (ZYRTEC) 10 MG tablet Take 10 mg by mouth daily.   Yes [provider]  cyclobenzaprine (FLEXERIL) 10 MG tablet Take 10 mg by mouth 2 (two) times daily. 02/27/16  Yes [provider]  ferrous sulfate 325 (65 FE) MG tablet Take 325 mg by mouth daily. 03/05/16  Yes [provider]  fluticasone (FLONASE) 50 MCG/ACT nasal spray Place 2 sprays into both nostrils daily. 10/13/17  Yes [provider]  lisinopril (PRINIVIL,ZESTRIL) 10 MG tablet Take 10 mg by mouth daily. 08/31/17  Yes [provider]  metFORMIN (GLUCOPHAGE) 500 MG tablet Take 500 mg by mouth daily. 10/13/17  Yes [provider]  montelukast (SINGULAIR) 10 MG tablet Take 10 mg by mouth daily. 08/18/17  Yes [provider]  omeprazole (PRILOSEC) 20 MG capsule Take 20 mg by mouth daily. 10/06/17  Yes [provider]  oxybutynin (DITROPAN) 5 MG tablet Take 5 mg by mouth daily. 09/21/17  Yes [provider]  oxybutynin (DITROPAN-XL) 10 MG 24 hr tablet Take 10 mg by mouth daily. 04/25/16  Yes [provider]  simvastatin (ZOCOR) 40 MG tablet Take 40 mg by mouth at bedtime. 10/13/17  Yes [provider]  traMADol (ULTRAM) 50 MG tablet Take 50 mg by mouth 2 (  two) times daily as needed for pain. 08/31/17  Yes [provider]  trimethoprim (TRIMPEX) 100 MG tablet Take 100 mg by mouth at bedtime. 10/12/17  Yes [provider]  Monte Fantasia INHUB 500-50 MCG/DOSE AEPB Inhale 1 puff into the lungs 2 (two) times daily. 10/13/17  Yes [provider]  pantoprazole (PROTONIX) 20 MG tablet Take 1 tablet (20 mg total) by mouth daily. Patient not taking: Reported on 10/17/2017 05/19/16   Tyrone Nine, MD    Family History Family History  Problem Relation Age of Onset  . Microcephaly Maternal Grandmother   . Heart disease Maternal Grandmother        Not clear details, patient just knows she took  "heart pills"    Social History Social History   Tobacco Use  . Smoking status: Never Smoker  . Smokeless tobacco: Never Used  Substance Use Topics  . Alcohol use: No  . Drug use: No     Allergies   Codeine and Morphine and related   Review of Systems Review of Systems  Respiratory: Positive for cough.   All other systems reviewed and are negative.    Physical Exam Updated Vital Signs BP (!) 143/97   Pulse 88   Temp 98.3 F (36.8 C) (Oral)   Resp 18   SpO2 100%   Physical Exam  Constitutional: She is oriented to person, place, and time.  Coughing   HENT:  Head: Normocephalic.  Mouth/Throat: Oropharynx is clear and moist.  Eyes: Pupils are equal, round, and reactive to light. Conjunctivae and EOM are normal.  Neck: Normal range of motion. Neck supple.  Cardiovascular: Normal rate, regular rhythm and normal heart sounds.  Pulmonary/Chest:  Tachypneic, diffuse wheezing, no retractions.   Abdominal: Soft. Bowel sounds are normal. She exhibits no distension. There is no tenderness.  Musculoskeletal: Normal range of motion.  Neurological: She is alert and oriented to person, place, and time. No cranial nerve deficit. Coordination normal.  Skin: Skin is warm.  Psychiatric: She has a normal mood and affect.  Nursing note and vitals reviewed.    ED Treatments / Results  Labs (all labs ordered are listed, but only abnormal results are displayed) Labs Reviewed  CBC WITH DIFFERENTIAL/PLATELET - Abnormal; Notable for the following components:      Result Value   Hemoglobin 10.4 (*)    HCT 33.9 (*)    All other components within normal limits  COMPREHENSIVE METABOLIC PANEL  I-STAT TROPONIN, ED    EKG EKG Interpretation  Date/Time:  Saturday October 17 2017 10:26:54 EDT Ventricular Rate:  79 PR Interval:    QRS Duration: 95 QT Interval:  364 QTC Calculation: 418 R Axis:   71 Text Interpretation:  Sinus rhythm Short PR interval No significant change since  last tracing Confirmed by Richardean Canal [phone]) on 10/17/2017 10:55:34 AM   Radiology Dg Chest 2 View  Result Date: 10/17/2017 CLINICAL DATA:  Pt reported coughing, wheezing, and SOB x 1 month with no relief. Medical hx of COPD. EXAM: CHEST - 2 VIEW COMPARISON:  05/17/2016 FINDINGS: Midline trachea. Normal heart size and mediastinal contours. No pleural effusion or pneumothorax. Mild subsegmental atelectasis at the right lung base laterally. Clear left lung. IMPRESSION: No acute cardiopulmonary disease. Electronically Signed   By: Jeronimo Greaves M.D.   On: 10/17/2017 12:30    Procedures Procedures (including critical care time)  Medications Ordered in ED Medications  methylPREDNISolone sodium succinate (SOLU-MEDROL) 125 mg/2 mL injection 125 mg (125 mg  Intravenous Given 10/17/17 1027)  sodium chloride 0.9 % bolus 1,000 mL (0 mLs Intravenous Stopped 10/17/17 1127)  albuterol (PROVENTIL,VENTOLIN) solution continuous neb (15 mg/hr Nebulization Given 10/17/17 1044)  ipratropium (ATROVENT) nebulizer solution 0.5 mg (0.5 mg Nebulization Given 10/17/17 1044)     Initial Impression / Assessment and Plan / ED Course  I have reviewed the triage vital signs and the nursing notes.  Pertinent labs & imaging results that were available during my care of the patient were reviewed by me and considered in my medical decision making (see chart for details).    Lindsay [name] is a 72 y.o. female here with cough, wheezing. I think likely persistent bronchitis vs COPD exacerbation vs pneumonia. Will get labs, CXR. Will give nebs, steroids.   1:11 PM Labs and CXR clear. Patient has no wheezing after continuous nebs, steroids. Never hypoxic. I think likely COPD exacerbation. Will dc home with zpack, course of steroids, albuterol prn, tessalon pearls. Will have her continue her omeprazole.    Final Clinical Impressions(s) / ED Diagnoses   Final diagnoses:  None    ED Discharge Orders    None         Charlynne Pander, MD 10/17/17 1313

## 2017-10-17 NOTE — ED Triage Notes (Signed)
Pt complains of cough for the past 33 days. Pt states she went to her PCP and had x-rays performed last week, which were negative. Pt was put on omeprazole but states symptoms have not improved.

## 2017-11-02 ENCOUNTER — Ambulatory Visit
Admission: RE | Admit: 2017-11-02 | Discharge: 2017-11-02 | Disposition: A | Discharge status: Home / Self Care | Payer: Medicare Other | Source: Ambulatory Visit | Attending: Internal Medicine | Admitting: Internal Medicine

## 2017-11-02 DIAGNOSIS — Z1231 Encounter for screening mammogram for malignant neoplasm of breast: Secondary | ICD-10-CM

## 2017-11-03 ENCOUNTER — Encounter: Payer: Self-pay | Admitting: Internal Medicine

## 2017-11-03 ENCOUNTER — Ambulatory Visit (INDEPENDENT_AMBULATORY_CARE_PROVIDER_SITE_OTHER): Payer: Medicare Other | Admitting: Internal Medicine

## 2017-11-03 VITALS — BP 124/80 | HR 79 | Ht 64.0 in | Wt 212.0 lb

## 2017-11-03 DIAGNOSIS — J45991 Cough variant asthma: Secondary | ICD-10-CM | POA: Diagnosis not present

## 2017-11-03 MED ORDER — BUDESONIDE-FORMOTEROL FUMARATE 80-4.5 MCG/ACT IN AERO: 2.0000 | INHALATION_SPRAY | Freq: Two times a day (BID) | RESPIRATORY_TRACT | 0 refills | Status: DC

## 2017-11-03 MED ORDER — BUDESONIDE-FORMOTEROL FUMARATE 80-4.5 MCG/ACT IN AERO: 2.0000 | INHALATION_SPRAY | Freq: Two times a day (BID) | RESPIRATORY_TRACT | 11 refills | Status: DC

## 2017-11-03 NOTE — Assessment & Plan Note (Addendum)
Off acei around mid oct 2019 - Spirometry 11/03/2017  FEV1 2.6 (92%)  Ratio 78 p neb > 4 h prior - 11/03/2017  After extensive coaching inhaler device,  effectiveness =    75% try symb 80 2bid prn    Her symptoms and now mild but have recently been dtc  DDX of  difficult airways management almost all start with A and  include Adherence, Ace Inhibitors, Acid Reflux, Active Sinus Disease, Alpha 1 Antitripsin deficiency, Anxiety masquerading as Airways dz,  ABPA,  Allergy(esp in young), Aspiration (esp in elderly), Adverse effects of meds,  Active smoking or vaping, A bunch of PE's (a small clot burden can't cause this syndrome unless there is already severe underlying pulm or vascular dz with poor reserve) plus two Bs  = Bronchiectasis and Beta blocker use..and one C= CHF    Adherence is always the initial "prime suspect" and is a multilayered concern that requires a "trust but verify" approach in every patient - starting with knowing how to use medications, especially inhalers, correctly, keeping up with refills and understanding the fundamental difference between maintenance and prns vs those medications only taken for a very short course and then stopped and not refilled.  - see hfa teaching  - return in 6 weeks with all meds in hand using a trust but verify approach to confirm accurate Medication  Reconciliation The principal here is that until we are certain that the  patients are doing what we've asked, it makes no sense to ask them to do more.   ? ACEi case Although even in retrospect it may not be clear the ACEi contributed to the pt's symptoms,  Pt improved off them and adding them back at this point or in the future would risk confusion in interpretation of non-specific respiratory symptoms to which this patient is prone  ie  Better not to muddy the waters here.    ? Adverse effects of DPI > change to hfa  ? Allergy/ mild asthma: continue daily singular  Based on two studies from NEJM   378; 20 p 1865 (2018) and 380 : p2020-30 (2019) in pts with mild asthma it is reasonable to use low dose symbicort eg 80 2bid "prn" flare in this setting but I emphasized this was only shown with symbicort and takes advantage of the rapid onset of action but is not the same as "rescue therapy" but can be stopped once the acute symptoms have resolved and the need for rescue has been minimized (< 2 x weekly) Hold on allergy testing for now    ? Acid (or non-acid) GERD > always difficult to exclude as up to 75% of pts in some series report no assoc GI/ Heartburn symptoms> rec continue max (24h)  acid suppression and diet restrictions/ reviewed      Total time devoted to counseling  > 50 % of initial 60 min office visit:  review case with pt/ discussion of options/alternatives/ personally creating written customized instructions  in presence of pt  then going over those specific  Instructions directly with the pt including how to use all of the meds but in particular covering each new medication in detail and the difference between the maintenance= "automatic" meds and the prns using an action plan format for the latter (If this problem/symptom => do that organization reading Left to right).  Please see AVS from this visit for a full list of these instructions which I personally wrote for this pt and  are unique to this visit.  See device teaching which extended face to face time for this visit

## 2017-11-03 NOTE — Progress Notes (Addendum)
Lindsay [name], female    DOB: 1945/07/03,    MRN: [id number]    Brief patient profile:  72 yowf quit smoking 01/06/97 with ? Asthma as child and dx of copd as adult with new pattern of cough since early Set 2019 so referred to pulmonary clinic 11/03/2017 by Dr   Mikeal Hawthorne - note stopped ACEi     11/03/2017 Pulmonary/ 1st office eval/ Lindsay [name]  Chief Complaint  Patient presents with  . Pulmonary Consult    Referred by Dr. Gwenyth Bouillon.  Pt states she was dxed with Asthma as a child. She c/o cough since early Sept 2019. Her cough is non prod.    prior to sept 2019 was taking "some"  advair/ proair a couple of times a week then since September 2019 using advair max dose bid and albuterol and prednisone and to ER Oct 12 then stopped Lisinopril after that and took prednisone  Doe = MMRC3 = can't walk 100 yards even at a slow pace at a flat grade s stopping due to sob  / back problem  Cough sporadic day > noct / dry and better off acei  Sleep on R  side bed 10 degrees    No obvious day to day or daytime variability or assoc excess/ purulent sputum or mucus plugs or hemoptysis or cp or chest tightness, subjective wheeze or overt sinus or hb symptoms.   Sleeping as above without nocturnal  or early am exacerbation  of respiratory  c/o's or need for noct saba. Also denies any obvious fluctuation of symptoms with weather or environmental changes or other aggravating or alleviating factors except as outlined above   No unusual exposure hx or h/o childhood pna or knowledge of premature birth.  Current Allergies, Complete Past Medical History, Past Surgical History, Family History, and Social History were reviewed in [employer] [facility] record.  ROS  The following are not active complaints unless bolded Hoarseness, sore throat, dysphagia, dental problems, itching, sneezing,  nasal congestion or discharge of excess mucus or purulent secretions, ear ache,   fever, chills, sweats, unintended wt  loss or wt gain, classically pleuritic or exertional cp,  orthopnea pnd or arm/hand swelling  or leg swelling, presyncope, palpitations, abdominal pain, anorexia, nausea, vomiting, diarrhea  or change in bowel habits or change in bladder habits, change in stools or change in urine, dysuria, hematuria,  rash, arthralgias, visual complaints, headache, numbness, weakness or ataxia or problems with walking or coordination,  change in mood or  memory.               Past Medical History:  Diagnosis Date  . Acid reflux   . Asthma   . COPD (chronic obstructive pulmonary disease) (HCC)   . Hyperglycemia   . Hypokalemia   . Obesity   . Spinal stenosis   . Urinary tract infection, chronic     Outpatient Medications Prior to Visit  Medication Sig Dispense Refill  . albuterol (PROVENTIL HFA;VENTOLIN HFA) 108 (90 BASE) MCG/ACT inhaler Inhale 1-2 puffs into the lungs every 6 (six) hours as needed for wheezing or shortness of breath.    Marland Kitchen aspirin EC 81 MG tablet Take 81 mg by mouth daily.    . cetirizine (ZYRTEC) 10 MG tablet Take 10 mg by mouth daily.    . cyclobenzaprine (FLEXERIL) 10 MG tablet Take 10 mg by mouth 2 (two) times daily.  0  . ferrous sulfate 325 (65 FE) MG tablet Take 325 mg by mouth  daily.  0  . fluticasone (FLONASE) 50 MCG/ACT nasal spray Place 2 sprays into both nostrils daily.  2  . metFORMIN (GLUCOPHAGE) 500 MG tablet Take 500 mg by mouth daily.  0  . montelukast (SINGULAIR) 10 MG tablet Take 10 mg by mouth daily.  2  . oxybutynin (DITROPAN) 5 MG tablet Take 5 mg by mouth daily.  3  . oxybutynin (DITROPAN-XL) 10 MG 24 hr tablet Take 10 mg by mouth daily.  0  . simvastatin (ZOCOR) 40 MG tablet Take 40 mg by mouth at bedtime.  0  . traMADol (ULTRAM) 50 MG tablet Take 50 mg by mouth 2 (two) times daily as needed for pain.  1  . trimethoprim (TRIMPEX) 100 MG tablet Take 100 mg by mouth at bedtime.  3  . WIXELA INHUB 500-50 MCG/DOSE AEPB Inhale 1 puff into the lungs 2 (two) times  daily.  4  . azithromycin (ZITHROMAX) 250 MG tablet Take 1 tablet (250 mg total) by mouth daily. Take first 2 tablets together, then 1 every day until finished. 6 tablet 0  . benzonatate (TESSALON) 100 MG capsule Take 1 capsule (100 mg total) by mouth 3 (three) times daily as needed for cough. 21 capsule 0  .     0  . omeprazole (PRILOSEC) 20 MG capsule Take 20 mg by mouth daily.  2  . pantoprazole (PROTONIX) 20 MG tablet Take 1 tablet (20 mg total) by mouth daily. 30 tablet 0  . predniSONE (DELTASONE) 20 MG tablet Take 60 mg daily x 2 days then 40 mg daily x 2 days then 20 mg daily x 2 days 12 tablet 0   No facility-administered medications prior to visit.             Objective:     BP 124/80 (BP Location: Left Arm, Cuff Size: Normal)   Pulse 79   Ht 5\' 4"  (1.626 m)   Wt 212 lb (96.2 kg)   SpO2 100%   BMI 36.39 kg/m   SpO2: 100 %  RA   Obese pleasant bf nad   HEENT: nl dentition, turbinates bilaterally, and oropharynx. Nl external ear canals without cough reflex   NECK :  without JVD/Nodes/TM/ nl carotid upstrokes bilaterally   LUNGS: no acc muscle use,  Nl contour chest which is clear to A and P bilaterally without cough on insp or exp maneuvers   CV:  RRR  no s3 or murmur or increase in P2, and no edema   ABD:  soft and nontender with nl inspiratory excursion in the supine position. No bruits or organomegaly appreciated, bowel sounds nl  MS:  Nl gait/ ext warm without deformities, calf tenderness, cyanosis or clubbing No obvious joint restrictions   SKIN: warm and dry without lesions    NEURO:  alert, approp, nl sensorium with  no motor or cerebellar deficits apparent.     I personally reviewed images and agree with radiology impression as follows:  CXR:   10/17/17  No acute cardiopulmonary disease.      Assessment   Cough variant asthma Off acei around mid oct 2019 - Spirometry 11/03/2017  FEV1 2.6 (92%)  Ratio 78 p neb > 4 h prior - 11/03/2017   After extensive coaching inhaler device,  effectiveness =    75% try symb 80 2bid prn    Her symptoms and now mild but have recently been dtc  DDX of  difficult airways management almost all start with A and  include Adherence, Ace Inhibitors, Acid Reflux, Active Sinus Disease, Alpha 1 Antitripsin deficiency, Anxiety masquerading as Airways dz,  ABPA,  Allergy(esp in young), Aspiration (esp in elderly), Adverse effects of meds,  Active smoking or vaping, A bunch of PE's (a small clot burden can't cause this syndrome unless there is already severe underlying pulm or vascular dz with poor reserve) plus two Bs  = Bronchiectasis and Beta blocker use..and one C= CHF    Adherence is always the initial "prime suspect" and is a multilayered concern that requires a "trust but verify" approach in every patient - starting with knowing how to use medications, especially inhalers, correctly, keeping up with refills and understanding the fundamental difference between maintenance and prns vs those medications only taken for a very short course and then stopped and not refilled.  - see hfa teaching  - return in 6 weeks with all meds in hand using a trust but verify approach to confirm accurate Medication  Reconciliation The principal here is that until we are certain that the  patients are doing what we've asked, it makes no sense to ask them to do more.   ? ACEi case Although even in retrospect it may not be clear the ACEi contributed to the pt's symptoms,  Pt improved off them and adding them back at this point or in the future would risk confusion in interpretation of non-specific respiratory symptoms to which this patient is prone  ie  Better not to muddy the waters here.    ? Adverse effects of DPI > change to hfa  ? Allergy/ mild asthma: continue daily singular  Based on two studies from NEJM  378; 20 p 1865 (2018) and 380 : p2020-30 (2019) in pts with mild asthma it is reasonable to use low dose symbicort  eg 80 2bid "prn" flare in this setting but I emphasized this was only shown with symbicort and takes advantage of the rapid onset of action but is not the same as "rescue therapy" but can be stopped once the acute symptoms have resolved and the need for rescue has been minimized (< 2 x weekly) Hold on allergy testing for now    ? Acid (or non-acid) GERD > always difficult to exclude as up to 75% of pts in some series report no assoc GI/ Heartburn symptoms> rec continue max (24h)  acid suppression and diet restrictions/ reviewed      Total time devoted to counseling  > 50 % of initial 60 min office visit:  review case with pt/ discussion of options/alternatives/ personally creating written customized instructions  in presence of pt  then going over those specific  Instructions directly with the pt including how to use all of the meds but in particular covering each new medication in detail and the difference between the maintenance= "automatic" meds and the prns using an action plan format for the latter (If this problem/symptom => do that organization reading Left to right).  Please see AVS from this visit for a full list of these instructions which I personally wrote for this pt and  are unique to this visit.  See device teaching which extended face to face time for this visit         Sandrea Hughs, MD 11/03/2017

## 2017-11-03 NOTE — Patient Instructions (Signed)
Plan A = Automatic = symbicort 80 up to 2 pffs every 12 hours if cough/ wheeze, short of breath  Work on inhaler technique:  relax and gently blow all the way out then take a nice smooth deep breath back in, triggering the inhaler at same time you start breathing in.  Hold for up to 5 seconds if you can. Blow out thru nose. Rinse and gargle with water when done      Plan B = Backup Only use your albuterol as a rescue medication to be used if you can't catch your breath by resting or doing a relaxed purse lip breathing pattern.  - The less you use it, the better it will work when you need it. - Ok to use the inhaler up to 2 puffs  every 4 hours if you must but call for appointment if use goes up over your usual need - Don't leave home without it !!  (think of it like the spare tire for your car)   Plan C = Crisis - only use your albuterol nebulizer if you first try Plan B and it fails to help > ok to use the nebulizer up to every 4 hours but if start needing it regularly call for immediate appointment     Please schedule a follow up office visit in 6 weeks, call sooner if needed with all medications /inhalers/ solutions in hand so we can verify exactly what you are taking. This includes all medications from all doctors and over the counters

## 2017-12-15 ENCOUNTER — Encounter: Payer: Self-pay | Admitting: Internal Medicine

## 2017-12-15 ENCOUNTER — Ambulatory Visit (INDEPENDENT_AMBULATORY_CARE_PROVIDER_SITE_OTHER): Payer: Medicare Other | Admitting: Internal Medicine

## 2017-12-15 VITALS — BP 128/76 | HR 84 | Ht 63.0 in | Wt 207.0 lb

## 2017-12-15 DIAGNOSIS — J45991 Cough variant asthma: Secondary | ICD-10-CM

## 2017-12-15 MED ORDER — BUDESONIDE-FORMOTEROL FUMARATE 80-4.5 MCG/ACT IN AERO: 2.0000 | INHALATION_SPRAY | Freq: Two times a day (BID) | RESPIRATORY_TRACT | 0 refills | Status: DC

## 2017-12-15 NOTE — Progress Notes (Signed)
Lindsay [name], female    DOB: 08/29/1945,    MRN: [id number]    Brief patient profile:  72 yowf quit smoking 01/06/97 with ? Asthma as child and dx of copd as adult (but nl spirometry 11/03/17 on bronchodilators) with new pattern of cough since early Set 2019 so referred to pulmonary clinic 11/03/2017 by Dr   Lindsay [name] - note stopped ACEi ? Oct 17 2017 and spirometry off all rx 12/15/2017 = no significant obst      History of Present Illness  11/03/2017 Pulmonary/ 1st office eval/ Lindsay [name]  Chief Complaint  Patient presents with  . Pulmonary Consult    Referred by Dr. Gwenyth [name].  Pt states she was dxed with Asthma as a child. She c/o cough since early Sept 2019. Her cough is non prod.    prior to sept 2019 was taking "some"  advair/ proair a couple of times a week then since September 2019 using advair max dose bid and albuterol and prednisone and to ER Oct 12 then stopped Lisinopril after that and took prednisone  Doe = MMRC3 = can't walk 100 yards even at a slow pace at a flat grade s stopping due to sob  / back problem  Cough sporadic day > noct / dry and better off acei  Sleep on R  side bed 10 degrees  rec Plan A = Automatic = symbicort 80 up to 2 pffs every 12 hours if cough/ wheeze, short of breath Work on inhaler technique:   Plan B = Backup Only use your albuterol as a rescue medication Plan C = Crisis - only use your albuterol nebulizer if you first try Plan B and it fails to help > ok to use the nebulizer up to every 4 hours but if start needing it regularly call for immediate appointment     12/15/2017  f/u ov/Lindsay [name] re: uacs no symb x sev days  Chief Complaint  Patient presents with  . Follow-up    Cough has improved some. She is using her rescue inhaler 4 x per wk. She has not used her neb recently.   Dyspnea:  Limited more by back pain, walks with cane  Cough: better to her satisfaction p adding gerd rx sev weeks prior to OV    Sleeping: R side down/ electric 10 degree  elevate hob SABA use: only when over does it  02: none    No obvious day to day or daytime variability or assoc excess/ purulent sputum or mucus plugs or hemoptysis or cp or chest tightness, subjective wheeze or overt sinus or hb symptoms.   Sleeping as above without nocturnal  or early am exacerbation  of respiratory  c/o's or need for noct saba. Also denies any obvious fluctuation of symptoms with weather or environmental changes or other aggravating or alleviating factors except as outlined above   No unusual exposure hx or h/o childhood pna or knowledge of premature birth.  Current Allergies, Complete Past Medical History, Past Surgical History, Family History, and Social History were reviewed in [employer] record.  ROS  The following are not active complaints unless bolded Hoarseness, sore throat, dysphagia, dental problems, itching, sneezing,  nasal congestion or discharge of excess mucus or purulent secretions, ear ache,   fever, chills, sweats, unintended wt loss or wt gain, classically pleuritic or exertional cp,  orthopnea pnd or arm/hand swelling  or leg swelling, presyncope, palpitations, abdominal pain, anorexia, nausea, vomiting, diarrhea  or change in bowel  habits or change in bladder habits, change in stools or change in urine, dysuria, hematuria,  rash, arthralgias, visual complaints, headache, numbness, weakness or ataxia or problems with walking or coordination,  change in mood or  memory.        Current Meds  Medication Sig  . albuterol (PROVENTIL HFA;VENTOLIN HFA) 108 (90 BASE) MCG/ACT inhaler Inhale 1-2 puffs into the lungs every 6 (six) hours as needed for wheezing or shortness of breath.  Marland Kitchen. aspirin EC 81 MG tablet Take 81 mg by mouth daily.  . budesonide-formoterol (SYMBICORT) 80-4.5 MCG/ACT inhaler Inhale 2 puffs into the lungs 2 (two) times daily.  . cetirizine (ZYRTEC) 10 MG tablet Take 10 mg by mouth daily.  . cyclobenzaprine (FLEXERIL) 10 MG  tablet Take 10 mg by mouth 2 (two) times daily.  . ferrous sulfate 325 (65 FE) MG tablet Take 325 mg by mouth daily.  . fluticasone (FLONASE) 50 MCG/ACT nasal spray Place 2 sprays into both nostrils daily.  Marland Kitchen. losartan (COZAAR) 50 MG tablet Take 1 tablet by mouth daily.  . metFORMIN (GLUCOPHAGE) 500 MG tablet Take 500 mg by mouth daily.  . montelukast (SINGULAIR) 10 MG tablet Take 10 mg by mouth daily.  Marland Kitchen. oxybutynin (DITROPAN) 5 MG tablet Take 5 mg by mouth daily.  . simvastatin (ZOCOR) 40 MG tablet Take 40 mg by mouth at bedtime.  . traMADol (ULTRAM) 50 MG tablet Take 50 mg by mouth 2 (two) times daily as needed for pain.  Marland Kitchen. trimethoprim (TRIMPEX) 100 MG tablet Take 100 mg by mouth at bedtime.            Objective:    amb obese bf nad/ good voice texture   Wt Readings from Last 3 Encounters:  12/15/17 207 lb (93.9 kg)  11/03/17 212 lb (96.2 kg)  05/18/16 219 lb 9.6 oz (99.6 kg)    Vital signs reviewed - Note on arrival 02 sats  100% on RA      HEENT: nl dentition, turbinates bilaterally, and oropharynx. Nl external ear canals without cough reflex   NECK :  without JVD/Nodes/TM/ nl carotid upstrokes bilaterally   LUNGS: no acc muscle use,  Nl contour chest which is clear to A and P bilaterally without cough on insp or exp maneuvers   CV:  RRR  no s3 or murmur or increase in P2, and no edema   ABD:  Obese/ soft and nontender with nl inspiratory excursion in the supine position. No bruits or organomegaly appreciated, bowel sounds nl  MS:  Nl gait/ ext warm without deformities, calf tenderness, cyanosis or clubbing No obvious joint restrictions   SKIN: warm and dry without lesions    NEURO:  alert, approp, nl sensorium with  no motor or cerebellar deficits apparent.           Assessment

## 2017-12-15 NOTE — Patient Instructions (Signed)
Continue symbicort 80 up to 2 puffs every 12 hours as needed for breathing/ coughing  Work on inhaler technique:  relax and gently blow all the way out then take a nice smooth deep breath back in, triggering the inhaler at same time you start breathing in.  Hold for up to 5 seconds if you can. Blow out thru nose. Rinse and gargle with water when done      Continue reflux/ heartburn meds as you are and discuss this with your PCP   If you are satisfied with your treatment plan,  let your doctor know and he/she can either refill your medications or you can return here when your prescription runs out.     If in any way you are not 100% satisfied,  please tell us.  If 100% better, tell your friends!  Pulmonary follow up is as needed

## 2017-12-16 ENCOUNTER — Encounter: Payer: Self-pay | Admitting: Internal Medicine

## 2017-12-16 NOTE — Assessment & Plan Note (Addendum)
Off acei around mid oct 2019 - Spirometry 11/03/2017  FEV1 2.6 (92%)  Ratio 78  - 11/03/2017  After extensive coaching inhaler device,  effectiveness =    75% try symb 80 2bid prn    - Spirometry 12/15/2017  FEV1 1.6 (94%)  Ratio 75 off all rx s curvature - 12/15/2017  After extensive coaching inhaler device,  effectiveness =    75% (early trigger)   Despite smoking hx and chronic mild doe she does not have copd based on today's spirometry and not even sure there is any asthma here as could have all been related to gerd but if there is asthma it is mild and intermittent and fine to use symb80 "prn" here  Based on two studies from NEJM  378; 20 p 1865 (2018) and 380 : p2020-30 (2019) in pts with mild asthma it is reasonable to use low dose symbicort eg 80 2bid "prn" flare in this setting but I emphasized this was only shown with symbicort and takes advantage of the rapid onset of action but is not the same as "rescue therapy" but can be stopped once the acute symptoms have resolved and the need for rescue has been minimized (< 2 x weekly)     I had an extended final summary discussion with the patient reviewing all relevant studies completed to date and  lasting 15 to 20 minutes of a 25 minute visit    See device teaching which extended face to face time for this visit.  Each maintenance medication was reviewed in detail including emphasizing most importantly the difference between maintenance and prns and under what circumstances the prns are to be triggered using an action plan format that is not reflected in the computer generated alphabetically organized AVS which I have not found useful in most complex patients, especially with respiratory illnesses  Please see AVS for specific instructions unique to this visit that I personally wrote and verbalized to the the pt in detail and then reviewed with pt  by my nurse highlighting any  changes in therapy recommended at today's visit to their plan of  care.

## 2018-01-22 ENCOUNTER — Encounter (HOSPITAL_COMMUNITY): Payer: Self-pay

## 2018-01-22 ENCOUNTER — Emergency Department (HOSPITAL_COMMUNITY): Payer: Medicare Other

## 2018-01-22 ENCOUNTER — Emergency Department (HOSPITAL_COMMUNITY)
Admission: EM | Admit: 2018-01-22 | Discharge: 2018-01-22 | Disposition: A | Discharge status: Home / Self Care | Payer: Medicare Other | Attending: Emergency Medicine | Admitting: Emergency Medicine

## 2018-01-22 DIAGNOSIS — R109 Unspecified abdominal pain: Secondary | ICD-10-CM

## 2018-01-22 DIAGNOSIS — Z7984 Long term (current) use of oral hypoglycemic drugs: Secondary | ICD-10-CM | POA: Diagnosis not present

## 2018-01-22 DIAGNOSIS — J449 Chronic obstructive pulmonary disease, unspecified: Secondary | ICD-10-CM | POA: Insufficient documentation

## 2018-01-22 DIAGNOSIS — R1032 Left lower quadrant pain: Secondary | ICD-10-CM | POA: Diagnosis present

## 2018-01-22 DIAGNOSIS — Z87891 Personal history of nicotine dependence: Secondary | ICD-10-CM | POA: Diagnosis not present

## 2018-01-22 DIAGNOSIS — Z79899 Other long term (current) drug therapy: Secondary | ICD-10-CM | POA: Diagnosis not present

## 2018-01-22 DIAGNOSIS — Z7982 Long term (current) use of aspirin: Secondary | ICD-10-CM | POA: Insufficient documentation

## 2018-01-22 LAB — CBC
HCT: 36 % (ref 36.0–46.0)
Hemoglobin: 11 g/dL — ABNORMAL LOW (ref 12.0–15.0)
MCH: 26.9 pg (ref 26.0–34.0)
MCHC: 30.6 g/dL (ref 30.0–36.0)
MCV: 88 fL (ref 80.0–100.0)
PLATELETS: 380 10*3/uL (ref 150–400)
RBC: 4.09 MIL/uL (ref 3.87–5.11)
RDW: 13.3 % (ref 11.5–15.5)
WBC: 7.8 10*3/uL (ref 4.0–10.5)
nRBC: 0 % (ref 0.0–0.2)

## 2018-01-22 LAB — COMPREHENSIVE METABOLIC PANEL
ALT: 14 U/L (ref 0–44)
AST: 14 U/L — ABNORMAL LOW (ref 15–41)
Albumin: 4.2 g/dL (ref 3.5–5.0)
Alkaline Phosphatase: 52 U/L (ref 38–126)
Anion gap: 10 (ref 5–15)
BUN: 30 mg/dL — ABNORMAL HIGH (ref 8–23)
CO2: 23 mmol/L (ref 22–32)
Calcium: 9.5 mg/dL (ref 8.9–10.3)
Chloride: 104 mmol/L (ref 98–111)
Creatinine, Ser: 0.92 mg/dL (ref 0.44–1.00)
GFR calc non Af Amer: >60 mL/min (ref >60)
Glucose, Bld: 116 mg/dL — ABNORMAL HIGH (ref 70–99)
Potassium: 3.8 mmol/L (ref 3.5–5.1)
SODIUM: 137 mmol/L (ref 135–145)
Total Bilirubin: 0.2 mg/dL — ABNORMAL LOW (ref 0.3–1.2)
Total Protein: 7.9 g/dL (ref 6.5–8.1)

## 2018-01-22 LAB — URINALYSIS, ROUTINE W REFLEX MICROSCOPIC
Bilirubin Urine: NEGATIVE
Glucose, UA: NEGATIVE mg/dL
Hgb urine dipstick: NEGATIVE
Ketones, ur: NEGATIVE mg/dL
Leukocytes, UA: NEGATIVE
Nitrite: NEGATIVE
Protein, ur: NEGATIVE mg/dL
Specific Gravity, Urine: 1.012 (ref 1.005–1.030)
pH: 5 (ref 5.0–8.0)

## 2018-01-22 LAB — LIPASE, BLOOD: Lipase: 31 U/L (ref 11–51)

## 2018-01-22 MED ORDER — SODIUM CHLORIDE 0.9% FLUSH
3.0000 mL | Freq: Once | INTRAVENOUS | Status: AC
  Administered 2018-01-22: 3 mL via INTRAVENOUS

## 2018-01-22 MED ORDER — ONDANSETRON 4 MG PO TBDP: 4.0000 mg | ORAL_TABLET | Freq: Three times a day (TID) | ORAL | 0 refills | Status: DC | PRN

## 2018-01-22 MED ORDER — SODIUM CHLORIDE 0.9 % IV BOLUS
500.0000 mL | Freq: Once | INTRAVENOUS | Status: AC
  Administered 2018-01-22: 500 mL via INTRAVENOUS

## 2018-01-22 MED ORDER — ONDANSETRON HCL 4 MG/2ML IJ SOLN
4.0000 mg | Freq: Once | INTRAMUSCULAR | Status: AC
  Administered 2018-01-22: 4 mg via INTRAVENOUS
  Filled 2018-01-22: qty 2

## 2018-01-22 MED ORDER — FENTANYL CITRATE (PF) 100 MCG/2ML IJ SOLN
50.0000 ug | Freq: Once | INTRAMUSCULAR | Status: AC
  Administered 2018-01-22: 50 ug via INTRAVENOUS
  Filled 2018-01-22: qty 2

## 2018-01-22 MED ORDER — PANTOPRAZOLE SODIUM 20 MG PO TBEC: 20.0000 mg | DELAYED_RELEASE_TABLET | Freq: Every day | ORAL | 0 refills | Status: DC

## 2018-01-22 NOTE — Discharge Instructions (Signed)
If you develop worsening, continued, or severe abdominal pain or back pain, uncontrolled vomiting, fever, chest or back pain, or any other new/concerning symptoms then return to the ER for evaluation.   Watch for a rash to the area of your pain

## 2018-01-22 NOTE — ED Triage Notes (Signed)
Pt presents with c/o abdominal pain radiating to her left flank area. Pt reports the pain started this morning. Pt repots some vomiting this morning as well.

## 2018-01-22 NOTE — ED Provider Notes (Signed)
[facility]-EMERGENCY DEPT Provider Note   CSN: [id number] Arrival date & time: 01/22/18  1107     History   Chief Complaint Chief Complaint  Patient presents with  . Abdominal Pain    HPI Lindsay [name] is a 73 y.o. female.  HPI  73 year old female presents with sudden onset left-sided back/flank pain.  Started around 4 AM.  Has been constant ever since it started.  Tried a muscle relaxer but it did not seem to help.  The pain is rated as severe.  She vomited at home but no bowel symptoms.  No urinary symptoms.  No cough, chest pain, shortness of breath.  The pain is not pleuritic.  There is no midline pain or weakness/numbness in her extremities.  Past Medical History:  Diagnosis Date  . Acid reflux   . Asthma   . COPD (chronic obstructive pulmonary disease) (HCC)   . Hyperglycemia   . Hypokalemia   . Obesity   . Spinal stenosis   . Urinary tract infection, chronic     Patient Active Problem List   Diagnosis Date Noted  . Cough variant asthma 11/03/2017  . Chest pain 05/17/2016  . Obesity 05/17/2016  . COPD (chronic obstructive pulmonary disease) (HCC)   . Acid reflux   . Urinary tract infection, chronic   . Hyperglycemia   . Hypokalemia   . COPD exacerbation (HCC) 01/24/2013    History reviewed. No pertinent surgical history.   OB History   No obstetric history on file.      Home Medications    Prior to Admission medications   Medication Sig Start Date End Date Taking? Authorizing Provider  aspirin EC 81 MG tablet Take 81 mg by mouth daily.   Yes [provider]  budesonide-formoterol (SYMBICORT) 80-4.5 MCG/ACT inhaler Inhale 2 puffs into the lungs 2 (two) times daily. 12/15/17  Yes Nyoka Cowden, MD  cetirizine (ZYRTEC) 10 MG tablet Take 10 mg by mouth daily.   Yes [provider]  cyclobenzaprine (FLEXERIL) 10 MG tablet Take 10 mg by mouth 2 (two) times daily. 02/27/16  Yes [provider]  ibuprofen  (ADVIL,MOTRIN) 800 MG tablet Take 800 mg by mouth every 8 (eight) hours as needed for mild pain.   Yes [provider]  losartan (COZAAR) 50 MG tablet Take 1 tablet by mouth daily. 11/16/17  Yes [provider]  metFORMIN (GLUCOPHAGE) 500 MG tablet Take 500 mg by mouth daily. 10/13/17  Yes [provider]  montelukast (SINGULAIR) 10 MG tablet Take 10 mg by mouth daily as needed (congestion).  08/18/17  Yes [provider]  polyvinyl alcohol (LIQUIFILM TEARS) 1.4 % ophthalmic solution Place 1 drop into both eyes as needed for dry eyes (red eyes).   Yes [provider]  simvastatin (ZOCOR) 40 MG tablet Take 40 mg by mouth at bedtime. 10/13/17  Yes [provider]  traMADol (ULTRAM) 50 MG tablet Take 50 mg by mouth 2 (two) times daily as needed for pain. 08/31/17  Yes [provider]  trimethoprim (TRIMPEX) 100 MG tablet Take 100 mg by mouth at bedtime. 10/12/17  Yes [provider]  VITAMIN D PO Take 1 capsule by mouth daily.   Yes [provider]  ondansetron (ZOFRAN ODT) 4 MG disintegrating tablet Take 1 tablet (4 mg total) by mouth every 8 (eight) hours as needed for nausea or vomiting. 01/22/18   Pricilla Loveless, MD  pantoprazole (PROTONIX) 20 MG tablet Take 1 tablet (  20 mg total) by mouth daily. 01/22/18   Pricilla Loveless, MD    Family History Family History  Problem Relation Age of Onset  . Microcephaly Maternal Grandmother   . Heart disease Maternal Grandmother        Not clear details, patient just knows she took "heart pills"    Social History Social History   Tobacco Use  . Smoking status: Former Smoker    Packs/day: 0.50    Years: 20.00    Pack years: 10.00    Types: Cigarettes    Last attempt to quit: 01/06/1997    Years since quitting: 21.0  . Smokeless tobacco: Never Used  Substance Use Topics  . Alcohol use: No  . Drug use: No     Allergies   Codeine and Morphine and related   Review of  Systems Review of Systems  Constitutional: Negative for fever.  Respiratory: Negative for shortness of breath.   Cardiovascular: Negative for chest pain.  Gastrointestinal: Positive for nausea and vomiting. Negative for abdominal pain and diarrhea.  Genitourinary: Positive for flank pain. Negative for dysuria and hematuria.  Musculoskeletal: Positive for back pain.  All other systems reviewed and are negative.    Physical Exam Updated Vital Signs BP 134/74 (BP Location: Right Arm)   Pulse 76   Temp 97.7 F (36.5 C) (Oral)   Resp 16   SpO2 99%   Physical Exam Vitals signs and nursing note reviewed.  Constitutional:      Appearance: She is well-developed. She is obese.  HENT:     Head: Normocephalic and atraumatic.     Right Ear: External ear normal.     Left Ear: External ear normal.     Nose: Nose normal.  Eyes:     General:        Right eye: No discharge.        Left eye: No discharge.  Cardiovascular:     Rate and Rhythm: Normal rate and regular rhythm.     Heart sounds: Normal heart sounds.  Pulmonary:     Effort: Pulmonary effort is normal.     Breath sounds: Normal breath sounds.  Abdominal:     Palpations: Abdomen is soft.     Tenderness: There is no abdominal tenderness. There is left CVA tenderness (mild).  Skin:    General: Skin is warm and dry.     Findings: No rash.     Comments: No obvious rash to suggest HSV at this time  Neurological:     Mental Status: She is alert.  Psychiatric:        Mood and Affect: Mood is not anxious.      ED Treatments / Results  Labs (all labs ordered are listed, but only abnormal results are displayed) Labs Reviewed  COMPREHENSIVE METABOLIC PANEL - Abnormal; Notable for the following components:      Result Value   Glucose, Bld 116 (*)    BUN 30 (*)    AST 14 (*)    Total Bilirubin 0.2 (*)    All other components within normal limits  CBC - Abnormal; Notable for the following components:   Hemoglobin 11.0 (*)     All other components within normal limits  URINALYSIS, ROUTINE W REFLEX MICROSCOPIC - Abnormal; Notable for the following components:   Color, Urine STRAW (*)    All other components within normal limits  LIPASE, BLOOD    EKG EKG Interpretation  Date/Time:  Friday January 22 2018 14:42:19  EST Ventricular Rate:  75 PR Interval:    QRS Duration: 94 QT Interval:  398 QTC Calculation: 445 R Axis:   59 Text Interpretation:  Sinus rhythm Abnormal R-wave progression, early transition no acute ST/T changes no significant change since Oct 2019 Confirmed by Pricilla LovelessGoldston, Kahleb Mcclane [phone]([id number]) on 01/22/2018 2:46:32 PM   Radiology Ct Renal Stone Study  Result Date: 01/22/2018 CLINICAL DATA:  Pt presents with c/o abdominal pain radiating to her left flank area. Pt reports the pain started this morning. Pt repots some vomiting this morning as well. EXAM: CT ABDOMEN AND PELVIS WITHOUT CONTRAST TECHNIQUE: Multidetector CT imaging of the abdomen and pelvis was performed following the standard protocol without IV contrast. COMPARISON:  04/10/2017 FINDINGS: Lower chest: No acute abnormality. Hepatobiliary: No focal liver abnormality is seen. Status post cholecystectomy. No biliary dilatation. Pancreas: Unremarkable. No pancreatic ductal dilatation or surrounding inflammatory changes. Spleen: Normal in size without focal abnormality. Adrenals/Urinary Tract: Adrenal glands are unremarkable. Kidneys are normal, without renal calculi, focal lesion, or hydronephrosis. Bladder is unremarkable. Stomach/Bowel: Stomach is within normal limits. Appendix appears normal. No evidence of bowel wall thickening, distention, or inflammatory changes. Vascular/Lymphatic: Mild common iliac artery atherosclerotic calcifications. No other vascular abnormality. No lymphadenopathy. Reproductive: Status post hysterectomy. No adnexal masses. Other: No abdominal wall hernia or abnormality. No abdominopelvic ascites. Musculoskeletal: No fracture or  acute finding. No osteoblastic or osteolytic lesions. There are prominent degenerative changes throughout the visualized spine. IMPRESSION: 1. No acute findings. 2. No evidence of renal or ureteral stones or obstructive uropathy. 3. Status post cholecystectomy and hysterectomy. Electronically Signed   By: Amie Portlandavid  Ormond M.D.   On: 01/22/2018 14:18    Procedures Procedures (including critical care time)  Medications Ordered in ED Medications  sodium chloride flush (NS) 0.9 % injection 3 mL (3 mLs Intravenous Given 01/22/18 1158)  fentaNYL (SUBLIMAZE) injection 50 mcg (50 mcg Intravenous Given 01/22/18 1221)  ondansetron (ZOFRAN) injection 4 mg (4 mg Intravenous Given 01/22/18 1220)  sodium chloride 0.9 % bolus 500 mL (0 mLs Intravenous Stopped 01/22/18 1455)     Initial Impression / Assessment and Plan / ED Course  I have reviewed the triage vital signs and the nursing notes.  Pertinent labs & imaging results that were available during my care of the patient were reviewed by me and considered in my medical decision making (see chart for details).     No clear cause for the patient's left flank pain.  It is mildly tender.  Urine is clear without UTI or blood.  Given age, CT scan obtained.  No kidney stone or obvious intra-abdominal/retroperitoneal emergency.  No AAA.  There is no pleuritic pain, chest pain, shortness of breath, hypoxia to suggest PE.  At this point, could be very early zoster but with no rash and vague symptoms I do not know that treating for zoster at this point would be beneficial.  However I did discuss watching for rash.  There is a little bit of left upper quadrant pain so I will place her on PPI as well.  Discharged home with return precautions.  ECG benign, I doubt ACS.  Final Clinical Impressions(s) / ED Diagnoses   Final diagnoses:  Left flank pain    ED Discharge Orders         Ordered    pantoprazole (PROTONIX) 20 MG tablet  Daily     01/22/18 1448     ondansetron (ZOFRAN ODT) 4 MG disintegrating tablet  Every 8 hours PRN  01/22/18 1448           Pricilla LovelessGoldston, Evart Mcdonnell, MD 01/22/18 1606

## 2018-02-11 ENCOUNTER — Ambulatory Visit (INDEPENDENT_AMBULATORY_CARE_PROVIDER_SITE_OTHER): Payer: Medicare Other | Admitting: Nurse Practitioner

## 2018-02-11 ENCOUNTER — Encounter (INDEPENDENT_AMBULATORY_CARE_PROVIDER_SITE_OTHER): Payer: Self-pay

## 2018-02-11 ENCOUNTER — Other Ambulatory Visit (INDEPENDENT_AMBULATORY_CARE_PROVIDER_SITE_OTHER): Payer: Medicare Other

## 2018-02-11 ENCOUNTER — Encounter: Payer: Self-pay | Admitting: Nurse Practitioner

## 2018-02-11 VITALS — BP 110/72 | HR 62 | Ht 63.0 in | Wt 210.0 lb

## 2018-02-11 DIAGNOSIS — N390 Urinary tract infection, site not specified: Secondary | ICD-10-CM

## 2018-02-11 DIAGNOSIS — E119 Type 2 diabetes mellitus without complications: Secondary | ICD-10-CM

## 2018-02-11 DIAGNOSIS — J45991 Cough variant asthma: Secondary | ICD-10-CM

## 2018-02-11 DIAGNOSIS — I10 Essential (primary) hypertension: Secondary | ICD-10-CM

## 2018-02-11 DIAGNOSIS — Z0000 Encounter for general adult medical examination without abnormal findings: Secondary | ICD-10-CM

## 2018-02-11 DIAGNOSIS — Z9189 Other specified personal risk factors, not elsewhere classified: Principal | ICD-10-CM

## 2018-02-11 DIAGNOSIS — K219 Gastro-esophageal reflux disease without esophagitis: Secondary | ICD-10-CM | POA: Diagnosis not present

## 2018-02-11 DIAGNOSIS — Z1159 Encounter for screening for other viral diseases: Secondary | ICD-10-CM

## 2018-02-11 DIAGNOSIS — E785 Hyperlipidemia, unspecified: Secondary | ICD-10-CM | POA: Diagnosis not present

## 2018-02-11 DIAGNOSIS — J449 Chronic obstructive pulmonary disease, unspecified: Secondary | ICD-10-CM

## 2018-02-11 DIAGNOSIS — E1169 Type 2 diabetes mellitus with other specified complication: Secondary | ICD-10-CM | POA: Insufficient documentation

## 2018-02-11 DIAGNOSIS — E118 Type 2 diabetes mellitus with unspecified complications: Secondary | ICD-10-CM | POA: Insufficient documentation

## 2018-02-11 LAB — COMPREHENSIVE METABOLIC PANEL
ALT: 14 U/L (ref 0–35)
AST: 12 U/L (ref 0–37)
Albumin: 4.4 g/dL (ref 3.5–5.2)
Alkaline Phosphatase: 60 U/L (ref 39–117)
BUN: 25 mg/dL — ABNORMAL HIGH (ref 6–23)
CO2: 29 mEq/L (ref 19–32)
Calcium: 10 mg/dL (ref 8.4–10.5)
Chloride: 105 mEq/L (ref 96–112)
Creatinine, Ser: 0.88 mg/dL (ref 0.40–1.20)
GFR: 76.73 mL/min (ref >60.00)
Glucose, Bld: 96 mg/dL (ref 70–99)
POTASSIUM: 3.9 meq/L (ref 3.5–5.1)
Sodium: 141 mEq/L (ref 135–145)
Total Bilirubin: 0.3 mg/dL (ref 0.2–1.2)
Total Protein: 7.7 g/dL (ref 6.0–8.3)

## 2018-02-11 LAB — CBC
HCT: 34.9 % — ABNORMAL LOW (ref 36.0–46.0)
HEMOGLOBIN: 11.4 g/dL — AB (ref 12.0–15.0)
MCHC: 32.8 g/dL (ref 30.0–36.0)
MCV: 82.7 fl (ref 78.0–100.0)
Platelets: 349 10*3/uL (ref 150.0–400.0)
RBC: 4.22 Mil/uL (ref 3.87–5.11)
RDW: 14.6 % (ref 11.5–15.5)
WBC: 9.5 10*3/uL (ref 4.0–10.5)

## 2018-02-11 LAB — LIPID PANEL
Cholesterol: 171 mg/dL (ref 0–200)
HDL: 45.4 mg/dL (ref >39.00)
LDL Cholesterol: 111 mg/dL — ABNORMAL HIGH (ref 0–99)
NonHDL: 125.58
Total CHOL/HDL Ratio: 4
Triglycerides: 72 mg/dL (ref 0.0–149.0)
VLDL: 14.4 mg/dL (ref 0.0–40.0)

## 2018-02-11 LAB — HEMOGLOBIN A1C: Hgb A1c MFr Bld: 6.7 % — ABNORMAL HIGH (ref 4.6–6.5)

## 2018-02-11 NOTE — Patient Instructions (Addendum)
Please head downstairs for lab work  I will see you back in 6 months for routine follow up   Mediterranean Diet A Mediterranean diet refers to food and lifestyle choices that are based on the traditions of countries located on the [employer]. This way of eating has been shown to help prevent certain conditions and improve outcomes for people who have chronic diseases, like kidney disease and heart disease. What are tips for following this plan? Lifestyle  Cook and eat meals together with your family, when possible.  Drink enough fluid to keep your urine clear or pale yellow.  Be physically active every day. This includes: ? Aerobic exercise like running or swimming. ? Leisure activities like gardening, walking, or housework.  Get 7-8 hours of sleep each night.  If recommended by your health care provider, drink red wine in moderation. This means 1 glass a day for nonpregnant women and 2 glasses a day for men. A glass of wine equals 5 oz (150 mL). Reading food labels   Check the serving size of packaged foods. For foods such as rice and pasta, the serving size refers to the amount of cooked product, not dry.  Check the total fat in packaged foods. Avoid foods that have saturated fat or trans fats.  Check the ingredients list for added sugars, such as corn syrup. Shopping  At the grocery store, buy most of your food from the areas near the walls of the store. This includes: ? Fresh fruits and vegetables (produce). ? Grains, beans, nuts, and seeds. Some of these may be available in unpackaged forms or large amounts (in bulk). ? Fresh seafood. ? Poultry and eggs. ? Low-fat dairy products.  Buy whole ingredients instead of prepackaged foods.  Buy fresh fruits and vegetables in-season from local farmers markets.  Buy frozen fruits and vegetables in resealable bags.  If you do not have access to quality fresh seafood, buy precooked frozen shrimp or canned fish, such as  tuna, salmon, or sardines.  Buy small amounts of raw or cooked vegetables, salads, or olives from the deli or salad bar at your store.  Stock your pantry so you always have certain foods on hand, such as olive oil, canned tuna, canned tomatoes, rice, pasta, and beans. Cooking  Cook foods with extra-virgin olive oil instead of using butter or other vegetable oils.  Have meat as a side dish, and have vegetables or grains as your main dish. This means having meat in small portions or adding small amounts of meat to foods like pasta or stew.  Use beans or vegetables instead of meat in common dishes like chili or lasagna.  Experiment with different cooking methods. Try roasting or broiling vegetables instead of steaming or sauteing them.  Add frozen vegetables to soups, stews, pasta, or rice.  Add nuts or seeds for added healthy fat at each meal. You can add these to yogurt, salads, or vegetable dishes.  Marinate fish or vegetables using olive oil, lemon juice, garlic, and fresh herbs. Meal planning   Plan to eat 1 vegetarian meal one day each week. Try to work up to 2 vegetarian meals, if possible.  Eat seafood 2 or more times a week.  Have healthy snacks readily available, such as: ? Vegetable sticks with hummus. ? [place] yogurt. ? Fruit and nut trail mix.  Eat balanced meals throughout the week. This includes: ? Fruit: 2-3 servings a day ? Vegetables: 4-5 servings a day ? Low-fat dairy: 2 servings a  day ? Fish, poultry, or lean meat: 1 serving a day ? Beans and legumes: 2 or more servings a week ? Nuts and seeds: 1-2 servings a day ? Whole grains: 6-8 servings a day ? Extra-virgin olive oil: 3-4 servings a day  Limit red meat and sweets to only a few servings a month What are my food choices?  Mediterranean diet ? Recommended ? Grains: Whole-grain pasta. Brown rice. Bulgar wheat. Polenta. Couscous. Whole-wheat bread. [place] Cobbatmeal. Quinoa. ? Vegetables: Artichokes. Beets.  Broccoli. Cabbage. Carrots. Eggplant. Green beans. Chard. Kale. Spinach. Onions. Leeks. Peas. Squash. Tomatoes. Peppers. Radishes. ? Fruits: Apples. Apricots. Avocado. Berries. Bananas. Cherries. Dates. Figs. Grapes. Lemons. Melon. Oranges. Peaches. Plums. Pomegranate. ? Meats and other protein foods: Beans. Almonds. Sunflower seeds. Pine nuts. Peanuts. Cod. Salmon. Scallops. Shrimp. Tuna. Tilapia. Clams. Oysters. Eggs. ? Dairy: Low-fat milk. Cheese. Greek yogurt. ? Beverages: Water. Red wine. Herbal tea. ? Fats and oils: Extra virgin olive oil. Avocado oil. Grape seed oil. ? Sweets and desserts: [place] yogurt with honey. Baked apples. Poached pears. Trail mix. ? Seasoning and other foods: Basil. Cilantro. Coriander. Cumin. Mint. Parsley. Sage. Rosemary. Tarragon. Garlic. Oregano. Thyme. Pepper. Balsalmic vinegar. Tahini. Hummus. Tomato sauce. Olives. Mushrooms. ? Limit these ? Grains: Prepackaged pasta or rice dishes. Prepackaged cereal with added sugar. ? Vegetables: Deep fried potatoes (french fries). ? Fruits: Fruit canned in syrup. ? Meats and other protein foods: Beef. Pork. Lamb. Poultry with skin. Hot dogs. Tomasa BlaseBacon. ? Dairy: Ice cream. Sour cream. Whole milk. ? Beverages: Juice. Sugar-sweetened soft drinks. Beer. Liquor and spirits. ? Fats and oils: Butter. Canola oil. Vegetable oil. Beef fat (tallow). Lard. ? Sweets and desserts: Cookies. Cakes. Pies. Candy. ? Seasoning and other foods: Mayonnaise. Premade sauces and marinades. ? The items listed may not be a complete list. Talk with your dietitian about what dietary choices are right for you. Summary  The Mediterranean diet includes both food and lifestyle choices.  Eat a variety of fresh fruits and vegetables, beans, nuts, seeds, and whole grains.  Limit the amount of red meat and sweets that you eat.  Talk with your health care provider about whether it is safe for you to drink red wine in moderation. This means 1 glass a day for  nonpregnant women and 2 glasses a day for men. A glass of wine equals 5 oz (150 mL). This information is not intended to replace advice given to you by your health care provider. Make sure you discuss any questions you have with your health care provider. Document Released: 08/16/2015 Document Revised: 09/18/2015 Document Reviewed: 08/16/2015 Elsevier Interactive Patient Education  2019 [employer]

## 2018-02-11 NOTE — Assessment & Plan Note (Signed)
Continue regular f/u with urology  

## 2018-02-11 NOTE — Assessment & Plan Note (Signed)
Continue metformin Update labs F/U with further recommendations pending lab results Healthy, mediterranean diet information provided on AVS - CBC; Future - Comprehensive metabolic panel; Future - Lipid panel; Future - Hemoglobin A1c; Future

## 2018-02-11 NOTE — Progress Notes (Signed)
Lindsay [name] is a 73 y.o. female with the following history as recorded in [employer]:  Patient Active Problem List   Diagnosis Date Noted  . Cough variant asthma 11/03/2017  . Chest pain 05/17/2016  . Obesity 05/17/2016  . COPD (chronic obstructive pulmonary disease) (HCC)   . Acid reflux   . Urinary tract infection, chronic   . Hyperglycemia   . Hypokalemia   . COPD exacerbation (HCC) 01/24/2013    Current Outpatient Medications  Medication Sig Dispense Refill  . aspirin EC 81 MG tablet Take 81 mg by mouth daily.    . budesonide-formoterol (SYMBICORT) 80-4.5 MCG/ACT inhaler Inhale 2 puffs into the lungs 2 (two) times daily. 1 Inhaler 0  . cetirizine (ZYRTEC) 10 MG tablet Take 10 mg by mouth daily.    . cyclobenzaprine (FLEXERIL) 10 MG tablet Take 10 mg by mouth 2 (two) times daily.  0  . ibuprofen (ADVIL,MOTRIN) 800 MG tablet Take 800 mg by mouth every 8 (eight) hours as needed for mild pain.    Marland Kitchen. losartan (COZAAR) 50 MG tablet Take 1 tablet by mouth daily.  0  . metFORMIN (GLUCOPHAGE) 500 MG tablet Take 500 mg by mouth daily.  0  . montelukast (SINGULAIR) 10 MG tablet Take 10 mg by mouth daily as needed (congestion).   2  . ondansetron (ZOFRAN ODT) 4 MG disintegrating tablet Take 1 tablet (4 mg total) by mouth every 8 (eight) hours as needed for nausea or vomiting. 10 tablet 0  . pantoprazole (PROTONIX) 20 MG tablet Take 1 tablet (20 mg total) by mouth daily. 14 tablet 0  . polyvinyl alcohol (LIQUIFILM TEARS) 1.4 % ophthalmic solution Place 1 drop into both eyes as needed for dry eyes (red eyes).    . simvastatin (ZOCOR) 40 MG tablet Take 40 mg by mouth at bedtime.  0  . traMADol (ULTRAM) 50 MG tablet Take 50 mg by mouth 2 (two) times daily as needed for pain.  1  . trimethoprim (TRIMPEX) 100 MG tablet Take 100 mg by mouth at bedtime.  3  . VITAMIN D PO Take 1 capsule by mouth daily.     No current facility-administered medications for this visit.     Allergies: Codeine and  Morphine and related  Past Medical History:  Diagnosis Date  . Acid reflux   . Asthma   . COPD (chronic obstructive pulmonary disease) (HCC)   . Hyperglycemia   . Hypokalemia   . Obesity   . Spinal stenosis   . Urinary tract infection, chronic     Past Surgical History:  Procedure Laterality Date  . ABDOMINAL HYSTERECTOMY    . ANKLE FUSION Left   . CHOLECYSTECTOMY      Family History  Problem Relation Age of Onset  . Microcephaly Maternal Grandmother   . Heart disease Maternal Grandmother        Not clear details, patient just knows she took "heart pills"  . Asthma Daughter     Social History   Tobacco Use  . Smoking status: Former Smoker    Packs/day: 0.50    Years: 20.00    Pack years: 10.00    Types: Cigarettes    Last attempt to quit: 01/06/1997    Years since quitting: 21.1  . Smokeless tobacco: Never Used  Substance Use Topics  . Alcohol use: No     Subjective:  [name] is here today to establish care as a new patient to our practice, transferring from prior provider  due to wanting to see a new provider. Aside from primary care, she is routinely followed by pulmonology for asthma/COPD, urology for recurrent UTIs. We will review current daily medications provided by prior pcp today.  Hypertension -maintained on losartan 50 daily Reports daily medication compliance without adverse medication effects. No headaches, vision changes, chest pain, shortness of breath, edema.  BP Readings from Last 3 Encounters:  02/11/18 110/72  01/22/18 134/74  12/15/17 128/76   Diabetes- maintained on metformin 500 once daily. Reports daily medication compliance without noted adverse medication effects. No tremor, diaphoresis, hypoglycemia, nausea, vomiting, diarrhea.  Lab Results  Component Value Date   HGBA1C 7.0 (H) 05/17/2016   HLD- maintained on simvastatin 40 daily Reports daily medication compliance without adverse medication effects.  Lab Results  Component  Value Date   CHOL 204 (H) 05/17/2016   HDL 35 (L) 05/17/2016   LDLCALC 156 (H) 05/17/2016   TRIG 64 05/17/2016   CHOLHDL 5.8 05/17/2016   Acid reflux- maintained on protonix 20 daily, has not noted any adverse effects of protonix Does notice symptoms of acid reflux if she does not take protonix daily   ROS- See HPI   Objective:  Vitals:   02/11/18 0936  BP: 110/72  Pulse: 62  SpO2: 96%  Weight: 210 lb (95.3 kg)  Height: 5\' 3"  (1.6 m)    General: Well developed, well nourished, in no acute distress  Skin : Warm and dry.  Head: Normocephalic and atraumatic  Eyes: Sclera and conjunctiva clear; pupils round and reactive to light; extraocular movements intact  Oropharynx: Pink, supple. No suspicious lesions  Neck: Supple Lungs: Respirations unlabored; clear to auscultation bilaterally without wheeze, rales, rhonchi  CVS exam: normal rate, regular rhythm, normal S1, S2, no murmurs, rubs, clicks or gallops.  Extremities: No edema, cyanosis, clubbing  Vessels: Symmetric bilaterally  Neurologic: Alert and oriented; speech intact; face symmetrical; moves all extremities well; CNII-XII intact without focal deficit  Psychiatric: Normal mood and affect.  Assessment:  1. Type 2 diabetes mellitus without complication, without long-term current use of insulin (HCC)   2. Gastroesophageal reflux disease, esophagitis presence not specified   3. Urinary tract infection, chronic   4. Chronic obstructive pulmonary disease, unspecified COPD type (HCC)   5. Cough variant asthma   6. Essential hypertension   7. Hyperlipidemia, unspecified hyperlipidemia type   8. Encounter for hepatitis C virus screening test for high risk patient     Plan:   Healthcare maintenance: -Encounter for hepatitis C virus screening test for high risk patient - Hepatitis C antibody; Future - Per pt she has had negative cologuard about 2-3 years ago for colon cancer screening, she will look for this record at home,  discussed that cologuard would be indicated q 3 years if normal   Return in about 6 months (around 08/12/2018) for routine follow up.  Orders Placed This Encounter  Procedures  . CBC    Standing Status:   Future    Standing Expiration Date:   02/12/2019  . Comprehensive metabolic panel    Standing Status:   Future    Standing Expiration Date:   02/12/2019  . Lipid panel    Standing Status:   Future    Standing Expiration Date:   02/12/2019  . Hemoglobin A1c    Standing Status:   Future    Standing Expiration Date:   02/12/2019  . Hepatitis C antibody    Standing Status:   Future    Standing Expiration  Date:   03/12/2018    Requested Prescriptions    No prescriptions requested or ordered in this encounter

## 2018-02-11 NOTE — Assessment & Plan Note (Signed)
Stable Continue protonix F/u for new, worsening symptoms  

## 2018-02-11 NOTE — Assessment & Plan Note (Signed)
Continue simvastatin Update labs- she is fasting Healthy, mediterranean diet information provided on AVS - CBC; Future - Comprehensive metabolic panel; Future - Lipid panel; Future - Hemoglobin A1c; Future

## 2018-02-11 NOTE — Assessment & Plan Note (Signed)
Continue regular f/u with pulmonology

## 2018-02-11 NOTE — Assessment & Plan Note (Signed)
Continue regular f/u with pulmonology  

## 2018-02-11 NOTE — Assessment & Plan Note (Signed)
Stable Continue losartan Update routine labs Healthy, mediterranean diet information provided on AVS - CBC; Future - Comprehensive metabolic panel; Future - Lipid panel; Future

## 2018-02-12 LAB — HEPATITIS C ANTIBODY
Hepatitis C Ab: NONREACTIVE
SIGNAL TO CUT-OFF: 0.01 (ref <1.00)

## 2018-02-15 ENCOUNTER — Other Ambulatory Visit: Payer: Self-pay | Admitting: Nurse Practitioner

## 2018-02-15 ENCOUNTER — Other Ambulatory Visit: Payer: Self-pay | Admitting: Emergency Medicine

## 2018-02-15 DIAGNOSIS — D649 Anemia, unspecified: Secondary | ICD-10-CM

## 2018-02-15 MED ORDER — SIMVASTATIN 40 MG PO TABS: 40.0000 mg | ORAL_TABLET | Freq: Every day | ORAL | 1 refills | Status: DC

## 2018-02-15 MED ORDER — MONTELUKAST SODIUM 10 MG PO TABS: 10.0000 mg | ORAL_TABLET | Freq: Every day | ORAL | 1 refills | Status: DC | PRN

## 2018-02-15 MED ORDER — LOSARTAN POTASSIUM 50 MG PO TABS: 50.0000 mg | ORAL_TABLET | Freq: Every day | ORAL | 1 refills | Status: DC

## 2018-03-05 ENCOUNTER — Other Ambulatory Visit (INDEPENDENT_AMBULATORY_CARE_PROVIDER_SITE_OTHER): Payer: Medicare Other

## 2018-03-05 DIAGNOSIS — D649 Anemia, unspecified: Secondary | ICD-10-CM | POA: Diagnosis not present

## 2018-03-05 LAB — VITAMIN B12: Vitamin B-12: 626 pg/mL (ref 211–911)

## 2018-03-05 LAB — CBC
HCT: 34.3 % — ABNORMAL LOW (ref 36.0–46.0)
Hemoglobin: 11.3 g/dL — ABNORMAL LOW (ref 12.0–15.0)
MCHC: 32.9 g/dL (ref 30.0–36.0)
MCV: 82.6 fl (ref 78.0–100.0)
Platelets: 365 10*3/uL (ref 150.0–400.0)
RBC: 4.16 Mil/uL (ref 3.87–5.11)
RDW: 14.5 % (ref 11.5–15.5)
WBC: 9.4 10*3/uL (ref 4.0–10.5)

## 2018-03-05 LAB — FERRITIN: Ferritin: 89.1 ng/mL (ref 10.0–291.0)

## 2018-03-05 LAB — FOLATE: Folate: 20.6 ng/mL (ref >5.9)

## 2018-03-06 LAB — IRON, TOTAL/TOTAL IRON BINDING CAP
%SAT: 17 % (calc) (ref 16–45)
Iron: 47 ug/dL (ref 45–160)
TIBC: 282 ug/dL (ref 250–450)

## 2018-03-08 ENCOUNTER — Other Ambulatory Visit: Payer: Self-pay | Admitting: Nurse Practitioner

## 2018-03-08 DIAGNOSIS — D649 Anemia, unspecified: Secondary | ICD-10-CM

## 2018-03-09 ENCOUNTER — Ambulatory Visit: Payer: Self-pay

## 2018-03-09 NOTE — Telephone Encounter (Signed)
Pt. requesting advice from PCP, if it is too late to receive a flu shot?  Advised that will send message to Alphonse Guild to answer this question.  Agrees with plan.       Reason for Disposition . Caller has NON-URGENT medication question about med that PCP prescribed and triager unable to answer question  Answer Assessment - Initial Assessment Questions 1. SYMPTOMS: "Do you have any symptoms?"     Denied any symptoms 2. SEVERITY: If symptoms are present, ask "Are they mild, moderate or severe?"     N/a  Protocols used: MEDICATION QUESTION CALL-A-AH  Message from Tonita Phoenix sent at 03/09/2018 4:02 PM EST   Summary: Flu Shot   Pt would like to know if she should still get a flu shot. She normally gets one every year because she is an asthmatic patient however she stated she has been doing pretty well not having one. Please advise.

## 2018-03-10 NOTE — Telephone Encounter (Signed)
Left detailed message informing pt she can still get a flu vaccine and to call our office to schedule a nurse visit for this. CRM created.

## 2018-03-11 ENCOUNTER — Ambulatory Visit (INDEPENDENT_AMBULATORY_CARE_PROVIDER_SITE_OTHER): Payer: Medicare Other

## 2018-03-11 DIAGNOSIS — Z23 Encounter for immunization: Secondary | ICD-10-CM

## 2018-04-12 ENCOUNTER — Telehealth: Payer: Self-pay | Admitting: Nurse Practitioner

## 2018-04-12 MED ORDER — METFORMIN HCL 500 MG PO TABS: 500.0000 mg | ORAL_TABLET | Freq: Every day | ORAL | 1 refills | Status: DC

## 2018-04-12 NOTE — Telephone Encounter (Signed)
I called pt bacl- I advised her the trimethoprim will need to come from the original prescriber. She states the Rx has 1 refill left on the bottle. I advised her to call [facility] now to fill that medication so she can p/u both the Metformin and trimethoprim at the same time to limit number of trips out to pharmacy due to COVID-19 pandemic. Pt verbalized understanding.

## 2018-04-12 NOTE — Telephone Encounter (Signed)
See other 04/12/18 tele encounter. Closing this encounter.

## 2018-04-12 NOTE — Telephone Encounter (Signed)
Copied from CRM [id number]. Topic: Quick Communication - Rx Refill/Question >> Apr 12, 2018 10:35 AM Saverio Danker J wrote: Medication: trimethoprim (TRIMPEX) 100 MG tablet and metFORMIN (GLUCOPHAGE) 500 MG tablet  Has the patient contacted their pharmacy? yes (Agent: If no, request that the patient contact the pharmacy for the refill.) (Agent: If yes, when and what did the pharmacy advise?)call the office   Preferred Pharmacy (with phone number or street name): [facility]  Pt only has 2 pills left   Agent: Please be advised that RX refills may take up to 3 business days. We ask that you follow-up with your pharmacy.

## 2018-04-12 NOTE — Telephone Encounter (Signed)
Pt called left voicemail returning a about her medication.please advise.

## 2018-04-12 NOTE — Telephone Encounter (Signed)
Metformin refill sent. It does not appear that Alphonse Guild, NP ever prescribed trimethoprim. Per her last OV she noted that patient is followed by urology for recurrent UTIs. Patient needs to follow up with urology for this. Left mess for patient to call back. CRM created.

## 2018-04-26 ENCOUNTER — Other Ambulatory Visit: Payer: Self-pay | Admitting: Nurse Practitioner

## 2018-04-28 ENCOUNTER — Telehealth: Payer: Self-pay | Admitting: Nurse Practitioner

## 2018-04-28 MED ORDER — VITAMIN D 50 MCG (2000 UT) PO TABS: 2000.0000 [IU] | ORAL_TABLET | Freq: Every day | ORAL | 1 refills | Status: DC

## 2018-04-28 NOTE — Telephone Encounter (Signed)
I spoke to pt- she states she has all her meds now and was informed that Alphonse Guild, NP refilled her meds x 6 mo in 2/20. I advised pt it is best to request her refills from her pharmacy and not to call our office for refills. She is now requesting Vit D 2000 IU qd and Flexeril. I informed her I can send the Vit D, but she will need a virtual visit with another provider for Flexeril rx. Pt declines virtual visit at this time. VIt D rx sent. See meds.

## 2018-04-28 NOTE — Addendum Note (Signed)
Addended by: Merrilyn Puma on: 04/28/2018 09:38 AM   Modules accepted: Orders

## 2018-04-28 NOTE — Telephone Encounter (Signed)
Patient called and she is very angry with the fact that her prescriptions are being denied and she is not getting a call about this. She says she needs her medications and it is ridiculous that no one will pick up the phone and call her. Please advise patient.

## 2018-06-08 LAB — HM DIABETES EYE EXAM

## 2018-06-14 ENCOUNTER — Other Ambulatory Visit: Payer: Self-pay

## 2018-06-14 ENCOUNTER — Ambulatory Visit (INDEPENDENT_AMBULATORY_CARE_PROVIDER_SITE_OTHER): Payer: Medicare Other | Admitting: Family

## 2018-06-14 ENCOUNTER — Other Ambulatory Visit (INDEPENDENT_AMBULATORY_CARE_PROVIDER_SITE_OTHER): Payer: Medicare Other

## 2018-06-14 ENCOUNTER — Encounter: Payer: Self-pay | Admitting: Family

## 2018-06-14 VITALS — BP 116/80 | HR 86 | Temp 98.1°F | Wt 209.0 lb

## 2018-06-14 DIAGNOSIS — K219 Gastro-esophageal reflux disease without esophagitis: Secondary | ICD-10-CM

## 2018-06-14 DIAGNOSIS — Z7189 Other specified counseling: Secondary | ICD-10-CM | POA: Diagnosis not present

## 2018-06-14 DIAGNOSIS — E119 Type 2 diabetes mellitus without complications: Secondary | ICD-10-CM | POA: Diagnosis not present

## 2018-06-14 DIAGNOSIS — N390 Urinary tract infection, site not specified: Secondary | ICD-10-CM

## 2018-06-14 DIAGNOSIS — M79641 Pain in right hand: Secondary | ICD-10-CM | POA: Diagnosis not present

## 2018-06-14 DIAGNOSIS — J45991 Cough variant asthma: Secondary | ICD-10-CM

## 2018-06-14 LAB — COMPREHENSIVE METABOLIC PANEL
ALT: 7 U/L (ref 0–35)
AST: 10 U/L (ref 0–37)
Albumin: 4.2 g/dL (ref 3.5–5.2)
Alkaline Phosphatase: 62 U/L (ref 39–117)
BUN: 25 mg/dL — ABNORMAL HIGH (ref 6–23)
CO2: 27 mEq/L (ref 19–32)
Calcium: 9.9 mg/dL (ref 8.4–10.5)
Chloride: 105 mEq/L (ref 96–112)
Creatinine, Ser: 0.91 mg/dL (ref 0.40–1.20)
GFR: 73.75 mL/min (ref >60.00)
Glucose, Bld: 159 mg/dL — ABNORMAL HIGH (ref 70–99)
Potassium: 3.7 mEq/L (ref 3.5–5.1)
Sodium: 140 mEq/L (ref 135–145)
Total Bilirubin: 0.2 mg/dL (ref 0.2–1.2)
Total Protein: 7.5 g/dL (ref 6.0–8.3)

## 2018-06-14 LAB — CBC WITH DIFFERENTIAL/PLATELET
Basophils Absolute: 0.1 10*3/uL (ref 0.0–0.1)
Basophils Relative: 0.9 % (ref 0.0–3.0)
Eosinophils Absolute: 0.4 10*3/uL (ref 0.0–0.7)
Eosinophils Relative: 5.4 % — ABNORMAL HIGH (ref 0.0–5.0)
HCT: 34.2 % — ABNORMAL LOW (ref 36.0–46.0)
Hemoglobin: 11 g/dL — ABNORMAL LOW (ref 12.0–15.0)
Lymphocytes Relative: 29.9 % (ref 12.0–46.0)
Lymphs Abs: 2.4 10*3/uL (ref 0.7–4.0)
MCHC: 32.3 g/dL (ref 30.0–36.0)
MCV: 82.6 fl (ref 78.0–100.0)
Monocytes Absolute: 0.6 10*3/uL (ref 0.1–1.0)
Monocytes Relative: 7.2 % (ref 3.0–12.0)
Neutro Abs: 4.6 10*3/uL (ref 1.4–7.7)
Neutrophils Relative %: 56.6 % (ref 43.0–77.0)
Platelets: 370 10*3/uL (ref 150.0–400.0)
RBC: 4.13 Mil/uL (ref 3.87–5.11)
RDW: 14.2 % (ref 11.5–15.5)
WBC: 8.2 10*3/uL (ref 4.0–10.5)

## 2018-06-14 LAB — HEMOGLOBIN A1C: Hgb A1c MFr Bld: 6.7 % — ABNORMAL HIGH (ref 4.6–6.5)

## 2018-06-14 MED ORDER — PANTOPRAZOLE SODIUM 20 MG PO TBEC: 20.0000 mg | DELAYED_RELEASE_TABLET | Freq: Every day | ORAL | 3 refills | Status: DC | PRN

## 2018-06-14 MED ORDER — TRAMADOL HCL 50 MG PO TABS: 50.0000 mg | ORAL_TABLET | Freq: Two times a day (BID) | ORAL | 1 refills | Status: DC | PRN

## 2018-06-14 MED ORDER — CYCLOBENZAPRINE HCL 10 MG PO TABS: 10.0000 mg | ORAL_TABLET | Freq: Two times a day (BID) | ORAL | 0 refills | Status: DC

## 2018-06-14 NOTE — Progress Notes (Signed)
Lindsay [name] is a 73 y.o. female with the following history as recorded in Epic[facility]:  Patient Active Problem List   Diagnosis Date Noted  . Type 2 diabetes mellitus without complication, without long-term current use of insulin ([place]) 02/11/2018  . Essential hypertension 02/11/2018  . Hyperlipidemia 02/11/2018  . Cough variant asthma 11/03/2017  . Chest pain 05/17/2016  . Obesity 05/17/2016  . COPD (chronic obstructive pulmonary disease) ([place])   . Acid reflux   . Urinary tract infection, chronic   . Hyperglycemia   . Hypokalemia   . COPD exacerbation ([place]) 01/24/2013    Current Outpatient Medications  Medication Sig Dispense Refill  . aspirin EC 81 MG tablet Take 81 mg by mouth daily.    . budesonide-formoterol (SYMBICORT) 80-4.5 MCG/ACT inhaler Inhale 2 puffs into the lungs 2 (two) times daily. 1 Inhaler 0  . cetirizine (ZYRTEC) 10 MG tablet Take 10 mg by mouth daily.    . Cholecalciferol (VITAMIN D) 50 MCG (2000 UT) tablet Take 1 tablet (2,000 Units total) by mouth daily. 90 tablet 1  . cyclobenzaprine (FLEXERIL) 10 MG tablet Take 1 tablet (10 mg total) by mouth 2 (two) times daily. 30 tablet 0  . ibuprofen (ADVIL,MOTRIN) 800 MG tablet Take 800 mg by mouth every 8 (eight) hours as needed for mild pain.    Marland Kitchen losartan (COZAAR) 50 MG tablet Take 1 tablet (50 mg total) by mouth daily. 90 tablet 1  . metFORMIN (GLUCOPHAGE) 500 MG tablet Take 1 tablet (500 mg total) by mouth daily. 90 tablet 1  . montelukast (SINGULAIR) 10 MG tablet Take 1 tablet (10 mg total) by mouth daily as needed (congestion). 90 tablet 1  . ondansetron (ZOFRAN ODT) 4 MG disintegrating tablet Take 1 tablet (4 mg total) by mouth every 8 (eight) hours as needed for nausea or vomiting. 10 tablet 0  . oxybutynin (DITROPAN) 5 MG tablet TK 1 T PO BID    . pantoprazole (PROTONIX) 20 MG tablet Take 1 tablet (20 mg total) by mouth daily as needed. For acid reflux 30 tablet 3  . polyvinyl alcohol (LIQUIFILM TEARS) 1.4 %  ophthalmic solution Place 1 drop into both eyes as needed for dry eyes (red eyes).    . simvastatin (ZOCOR) 40 MG tablet Take 1 tablet (40 mg total) by mouth at bedtime. 90 tablet 1  . traMADol (ULTRAM) 50 MG tablet Take 1 tablet (50 mg total) by mouth 2 (two) times daily as needed. 30 tablet 1  . trimethoprim (TRIMPEX) 100 MG tablet Take 100 mg by mouth at bedtime.  3  . VITAMIN D PO Take 1 capsule by mouth daily.     No current facility-administered medications for this visit.     Allergies: Codeine; Lisinopril; and Morphine and related  Past Medical History:  Diagnosis Date  . Acid reflux   . Asthma   . COPD (chronic obstructive pulmonary disease) ([place])   . Hyperglycemia   . Hypokalemia   . Obesity   . Spinal stenosis   . Urinary tract infection, chronic     Past Surgical History:  Procedure Laterality Date  . ABDOMINAL HYSTERECTOMY    . ANKLE FUSION Left   . CHOLECYSTECTOMY      Family History  Problem Relation Age of Onset  . Microcephaly Maternal Grandmother   . Heart disease Maternal Grandmother        Not clear details, patient just knows she took "heart pills"  . Asthma Daughter  Social History   Tobacco Use  . Smoking status: Former Smoker    Packs/day: 0.50    Years: 20.00    Pack years: 10.00    Types: Cigarettes    Last attempt to quit: 01/06/1997    Years since quitting: 21.4  . Smokeless tobacco: Never Used  Substance Use Topics  . Alcohol use: No    Subjective:  Follow-up on chronic care needs; history of hypertension and Type 2 Diabetes- very well controlled; patient notes she wants to be seen in the office every 3 months regardless; Denies any chest pain, shortness of breath, blurred vision or headache. Denies any concerns for low blood sugar; History of GERD- has been stable recently; very rarely has to take Protonix but would like a refill; Also needs refill on Tramadol- history of spinal stenosis; usually gets one prescription per year. Review  of [place] shows last written and filled in 08/2017; Works with urology for chronic UTIs; pulmonology for management of her asthma;    Objective:  Vitals:   06/14/18 1209  BP: 116/80  Pulse: 86  Temp: 98.1 F (36.7 C)  Weight: 209 lb (94.8 kg)    General: Well developed, well nourished, in no acute distress  Skin : Warm and dry.  Head: Normocephalic and atraumatic  Eyes: Sclera and conjunctiva clear; pupils round and reactive to light; extraocular movements intact  Ears: External normal; canals clear; tympanic membranes normal  Oropharynx: Pink, supple. No suspicious lesions  Neck: Supple without thyromegaly, adenopathy  Lungs: Respirations unlabored; clear to auscultation bilaterally without wheeze, rales, rhonchi  CVS exam: normal rate and regular rhythm.  Musculoskeletal: No deformities; no active joint inflammation  Extremities: No edema, cyanosis, clubbing  Vessels: Symmetric bilaterally  Neurologic: Alert and oriented; speech intact; face symmetrical; uses cane for stability; CNII-XII intact without focal deficit  Assessment:  1. Type 2 diabetes mellitus without complication, without long-term current use of insulin ([place])   2. Right hand pain   3. Grief counseling   4. Gastroesophageal reflux disease, esophagitis presence not specified   5. Urinary tract infection, chronic   6. Cough variant asthma     Plan:  1. Stable; check CBC, CMP, Hgba1c today; 2. Refer to hand specialist; 3. Refer to counselor- has lost 2 grown daughters in 5 years; 4. Refill on Protonix to use qd prn; 5. Continue with urology as scheduled. 6. Stable; continue with Dr. Melvyn Novas as scheduled.   Return in about 3 months (around 09/14/2018).  Orders Placed This Encounter  Procedures  . CBC w/Diff    Standing Status:   Future    Number of Occurrences:   1    Standing Expiration Date:   06/14/2019  . Comp Met (CMET)    Standing Status:   Future    Number of Occurrences:   1    Standing Expiration  Date:   06/14/2019  . HgB A1c    Standing Status:   Future    Number of Occurrences:   1    Standing Expiration Date:   06/14/2019  . Ambulatory referral to Orthopedic Surgery    Referral Priority:   Routine    Referral Type:   Surgical    Referral Reason:   Specialty Services Required    Requested Specialty:   Orthopedic Surgery    Number of Visits Requested:   1  . Ambulatory referral to Psychology    Referral Priority:   Routine    Referral Type:   Psychiatric  Referral Reason:   Specialty Services Required    Requested Specialty:   Psychology    Number of Visits Requested:   1    Requested Prescriptions   Signed Prescriptions Disp Refills  . pantoprazole (PROTONIX) 20 MG tablet 30 tablet 3    Sig: Take 1 tablet (20 mg total) by mouth daily as needed. For acid reflux  . cyclobenzaprine (FLEXERIL) 10 MG tablet 30 tablet 0    Sig: Take 1 tablet (10 mg total) by mouth 2 (two) times daily.  . traMADol (ULTRAM) 50 MG tablet 30 tablet 1    Sig: Take 1 tablet (50 mg total) by mouth 2 (two) times daily as needed.

## 2018-06-16 ENCOUNTER — Telehealth: Payer: Self-pay | Admitting: Family

## 2018-06-16 ENCOUNTER — Other Ambulatory Visit: Payer: Self-pay | Admitting: Family

## 2018-06-16 MED ORDER — TRIAMCINOLONE ACETONIDE 0.1 % EX CREA: 1.0000 "application " | TOPICAL_CREAM | Freq: Two times a day (BID) | CUTANEOUS | 0 refills | Status: DC

## 2018-06-16 NOTE — Telephone Encounter (Signed)
Copied from [place] [id number]. Topic: Quick Communication - Rx Refill/Question >> Jun 16, 2018  1:51 PM Margot Ables wrote: Medication: cream for eczema on arms - pt contacted the pharmacy and they did not receive an RX - pt notes it was discussed at [facility] with Jodi Mourning, NP  Has the patient contacted their pharmacy? yes Preferred Pharmacy (with phone number or street name): [facility] [phone] - [address], [address] AT [address] [phone] (Phone) [phone] (Fax)  with your pharmacy.

## 2018-06-17 ENCOUNTER — Telehealth: Payer: Self-pay | Admitting: Family

## 2018-06-17 NOTE — Telephone Encounter (Signed)
Copied from [place] [id number]. Topic: General - Other >> Jun 17, 2018  1:08 PM Berneta Levins wrote: Reason for CRM:   Pt states she needs to speak with Jodi Mourning.  Pt will not disclose what call is in regards to.

## 2018-06-18 NOTE — Telephone Encounter (Signed)
Spoke with patient and info given 

## 2018-06-18 NOTE — Telephone Encounter (Signed)
Call patient and have her give Dr.Burn's and Laura's name and have them decide who needs to go on the card.

## 2018-06-21 ENCOUNTER — Encounter: Payer: Self-pay | Admitting: Family

## 2018-06-21 NOTE — Progress Notes (Signed)
Outside notes received. Information abstracted. Notes sent to scan.  

## 2018-06-23 ENCOUNTER — Ambulatory Visit: Payer: Medicare Other | Admitting: Orthopaedic Surgery

## 2018-06-29 ENCOUNTER — Ambulatory Visit (INDEPENDENT_AMBULATORY_CARE_PROVIDER_SITE_OTHER): Payer: Medicare Other

## 2018-06-29 ENCOUNTER — Encounter: Payer: Self-pay | Admitting: Orthopaedic Surgery

## 2018-06-29 ENCOUNTER — Ambulatory Visit (INDEPENDENT_AMBULATORY_CARE_PROVIDER_SITE_OTHER): Payer: Medicare Other | Admitting: Orthopaedic Surgery

## 2018-06-29 ENCOUNTER — Other Ambulatory Visit: Payer: Self-pay

## 2018-06-29 DIAGNOSIS — M67441 Ganglion, right hand: Secondary | ICD-10-CM

## 2018-06-29 NOTE — Progress Notes (Signed)
Office Visit Note   Patient: Lindsay [name]           Date of Birth: 1945-02-12           MRN: [id number] Visit Date: 06/29/2018              Requested by: Marrian Salvage, [place],  [place] [address] PCP: Marrian Salvage, FNP   Assessment & Plan: Visit Diagnoses:  1. Ganglion cyst of flexor tendon sheath of finger of right hand     Plan: Impression is right hand middle finger ganglion cyst of flexor tendon sheath.  This seems to be quite bothersome to her hand function in use therefore we discussed surgical versus nonsurgical treatments and the associated risks and benefits including infection and recurrence she would like to proceed with surgical excision of the ganglion cyst as well as A1 pulley release.  Questions encouraged and answered.  We will schedule her in the near future.  Follow-Up Instructions: Return if symptoms worsen or fail to improve.   Orders:  Orders Placed This Encounter  Procedures  . XR Hand Complete Right   No orders of the defined types were placed in this encounter.     Procedures: No procedures performed   Clinical Data: No additional findings.   Subjective: Chief Complaint  Patient presents with  . Right Hand - Pain    Lindsay [name] is a 73 year old female comes in for evaluation of a painful knot in the palm of the right hand.  This is in line with the ulnar border of the middle finger.  She denies any injuries.  She does endorse difficulty with grasping objects because it feels like there is a rock in her hand.  She does also endorse inability to fully flex her middle finger down to the palmar crease.  Occasionally she will have some fingertip numbness at night.  She is right-handed.   Review of Systems  Constitutional: Negative.   HENT: Negative.   Eyes: Negative.   Respiratory: Negative.   Cardiovascular: Negative.   Endocrine: Negative.   Musculoskeletal: Negative.   Neurological: Negative.    Hematological: Negative.   Psychiatric/Behavioral: Negative.   All other systems reviewed and are negative.    Objective: Vital Signs: There were no vitals taken for this visit.  Physical Exam Vitals signs and nursing note reviewed.  Constitutional:      Appearance: She is well-developed.  HENT:     Head: Normocephalic and atraumatic.  Neck:     Musculoskeletal: Neck supple.  Pulmonary:     Effort: Pulmonary effort is normal.  Abdominal:     Palpations: Abdomen is soft.  Skin:    General: Skin is warm.     Capillary Refill: Capillary refill takes less than 2 seconds.  Neurological:     Mental Status: She is alert and oriented to person, place, and time.  Psychiatric:        Behavior: Behavior normal.        Thought Content: Thought content normal.        Judgment: Judgment normal.     Ortho Exam Right hand exam shows a palpable firm nodule at the distal palmar crease in line with the middle finger.  There is no active triggering of the finger.   Specialty Comments:  No specialty comments available.  Imaging: Xr Hand Complete Right  Result Date: 06/29/2018 No acute or structural abnormalities.    PMFS History: Patient Active Problem List  Diagnosis Date Noted  . Type 2 diabetes mellitus without complication, without long-term current use of insulin ([facility]) 02/11/2018  . Essential hypertension 02/11/2018  . Hyperlipidemia 02/11/2018  . Cough variant asthma 11/03/2017  . Chest pain 05/17/2016  . Obesity 05/17/2016  . COPD (chronic obstructive pulmonary disease) ([facility])   . Acid reflux   . Urinary tract infection, chronic   . Hyperglycemia   . Hypokalemia   . COPD exacerbation ([facility]) 01/24/2013   Past Medical History:  Diagnosis Date  . Acid reflux   . Asthma   . COPD (chronic obstructive pulmonary disease) ([facility])   . Hyperglycemia   . Hypokalemia   . Obesity   . Spinal stenosis   . Urinary tract infection, chronic     Family History  Problem Relation  Age of Onset  . Microcephaly Maternal Grandmother   . Heart disease Maternal Grandmother        Not clear details, patient just knows she took "heart pills"  . Asthma Daughter     Past Surgical History:  Procedure Laterality Date  . ABDOMINAL HYSTERECTOMY    . ANKLE FUSION Left   . CHOLECYSTECTOMY     Social History   Occupational History  . Not on file  Tobacco Use  . Smoking status: Former Smoker    Packs/day: 0.50    Years: 20.00    Pack years: 10.00    Types: Cigarettes    Quit date: 01/06/1997    Years since quitting: 21.4  . Smokeless tobacco: Never Used  Substance and Sexual Activity  . Alcohol use: No  . Drug use: No  . Sexual activity: Not on file

## 2018-07-05 ENCOUNTER — Telehealth: Payer: Self-pay

## 2018-07-05 NOTE — Telephone Encounter (Signed)
Called and left message for patient today following up with our conversation from Friday. Left number for her to call me back with any further questions.

## 2018-07-06 ENCOUNTER — Telehealth: Payer: Self-pay | Admitting: Family

## 2018-07-06 NOTE — Telephone Encounter (Signed)
General/Other - Call Back  The patient would like a Carla (NP Murray's CMA) to call her back today. She did not want to discus what it was about with me. Please call back.

## 2018-07-08 NOTE — Telephone Encounter (Signed)
Called and left message for patient to return call to clinic. 

## 2018-07-15 ENCOUNTER — Encounter: Payer: Self-pay | Admitting: Orthopaedic Surgery

## 2018-07-15 ENCOUNTER — Other Ambulatory Visit: Payer: Self-pay | Admitting: Physician Assistant

## 2018-07-15 DIAGNOSIS — M67441 Ganglion, right hand: Secondary | ICD-10-CM | POA: Diagnosis not present

## 2018-07-15 DIAGNOSIS — M65331 Trigger finger, right middle finger: Secondary | ICD-10-CM | POA: Diagnosis not present

## 2018-07-15 MED ORDER — ONDANSETRON HCL 4 MG PO TABS: 4.0000 mg | ORAL_TABLET | Freq: Three times a day (TID) | ORAL | 0 refills | Status: DC | PRN

## 2018-07-15 MED ORDER — HYDROCODONE-ACETAMINOPHEN 5-325 MG PO TABS: 1.0000 | ORAL_TABLET | Freq: Four times a day (QID) | ORAL | 0 refills | Status: DC | PRN

## 2018-07-27 ENCOUNTER — Encounter: Payer: Self-pay | Admitting: Orthopaedic Surgery

## 2018-07-27 ENCOUNTER — Ambulatory Visit (INDEPENDENT_AMBULATORY_CARE_PROVIDER_SITE_OTHER): Payer: Medicare Other | Admitting: Orthopaedic Surgery

## 2018-07-27 VITALS — Ht 63.0 in | Wt 209.0 lb

## 2018-07-27 DIAGNOSIS — M67441 Ganglion, right hand: Secondary | ICD-10-CM

## 2018-07-27 NOTE — Progress Notes (Signed)
Patient ID: Lindsay [name], female   DOB: 08/31/1945, 73 y.o.   MRN: [id number]  Lindsay [name] is two-week status post right middle finger ganglion cyst removal and flexor tendon sheath release.  She is overall doing well.  Her surgical incision is healed.  No signs of infection or drainage.  We remove the sutures today and will have her start Bactroban ointment and Band-Aid twice a day.  Her finger range of motion is near normal.  She is not quite able to make a full composite fist.  I recommend a short course of hand therapy.  I would like to recheck her in 4 weeks.

## 2018-07-29 DIAGNOSIS — M47816 Spondylosis without myelopathy or radiculopathy, lumbar region: Secondary | ICD-10-CM | POA: Diagnosis not present

## 2018-08-02 ENCOUNTER — Telehealth: Payer: Self-pay

## 2018-08-02 NOTE — Telephone Encounter (Signed)
Called and left message for [name], RN to return call to me at [phone]. We do not have these forms here in the office and they would need to fax it over to see if it was something we would be able to fill out for her.

## 2018-08-03 NOTE — Telephone Encounter (Signed)
Crystal with [facility] will contact jamie brown and have forms fax to dr office

## 2018-08-03 NOTE — Telephone Encounter (Signed)
I spoke with Roselyn Reef yesterday and form was completed and faxed back to [facility].

## 2018-08-12 ENCOUNTER — Ambulatory Visit: Payer: Medicare Other | Admitting: Nurse Practitioner

## 2018-08-13 ENCOUNTER — Ambulatory Visit: Payer: Medicare Other | Admitting: Nurse Practitioner

## 2018-08-18 ENCOUNTER — Other Ambulatory Visit: Payer: Self-pay | Admitting: Family

## 2018-08-18 MED ORDER — LOSARTAN POTASSIUM 50 MG PO TABS: 50.0000 mg | ORAL_TABLET | Freq: Every day | ORAL | 1 refills | Status: DC

## 2018-09-14 ENCOUNTER — Other Ambulatory Visit: Payer: Self-pay

## 2018-09-14 ENCOUNTER — Encounter: Payer: Self-pay | Admitting: Family

## 2018-09-14 ENCOUNTER — Ambulatory Visit (INDEPENDENT_AMBULATORY_CARE_PROVIDER_SITE_OTHER): Payer: Medicare Other | Admitting: Family

## 2018-09-14 ENCOUNTER — Other Ambulatory Visit: Payer: Self-pay | Admitting: Family

## 2018-09-14 ENCOUNTER — Telehealth: Payer: Self-pay

## 2018-09-14 ENCOUNTER — Other Ambulatory Visit (INDEPENDENT_AMBULATORY_CARE_PROVIDER_SITE_OTHER): Payer: Medicare Other

## 2018-09-14 VITALS — BP 114/72 | HR 80 | Temp 98.3°F | Ht 63.0 in | Wt 212.4 lb

## 2018-09-14 DIAGNOSIS — L309 Dermatitis, unspecified: Secondary | ICD-10-CM | POA: Diagnosis not present

## 2018-09-14 DIAGNOSIS — E119 Type 2 diabetes mellitus without complications: Secondary | ICD-10-CM

## 2018-09-14 DIAGNOSIS — Z1211 Encounter for screening for malignant neoplasm of colon: Secondary | ICD-10-CM

## 2018-09-14 DIAGNOSIS — D509 Iron deficiency anemia, unspecified: Secondary | ICD-10-CM

## 2018-09-14 LAB — CBC WITH DIFFERENTIAL/PLATELET
Basophils Absolute: 0.1 10*3/uL (ref 0.0–0.1)
Basophils Relative: 0.7 % (ref 0.0–3.0)
Eosinophils Absolute: 0.4 10*3/uL (ref 0.0–0.7)
Eosinophils Relative: 5.4 % — ABNORMAL HIGH (ref 0.0–5.0)
HCT: 32.4 % — ABNORMAL LOW (ref 36.0–46.0)
Hemoglobin: 10.5 g/dL — ABNORMAL LOW (ref 12.0–15.0)
Lymphocytes Relative: 33 % (ref 12.0–46.0)
Lymphs Abs: 2.6 10*3/uL (ref 0.7–4.0)
MCHC: 32.4 g/dL (ref 30.0–36.0)
MCV: 81.8 fl (ref 78.0–100.0)
Monocytes Absolute: 0.6 10*3/uL (ref 0.1–1.0)
Monocytes Relative: 7.9 % (ref 3.0–12.0)
Neutro Abs: 4.2 10*3/uL (ref 1.4–7.7)
Neutrophils Relative %: 53 % (ref 43.0–77.0)
Platelets: 346 10*3/uL (ref 150.0–400.0)
RBC: 3.97 Mil/uL (ref 3.87–5.11)
RDW: 14.4 % (ref 11.5–15.5)
WBC: 7.9 10*3/uL (ref 4.0–10.5)

## 2018-09-14 LAB — COMPREHENSIVE METABOLIC PANEL
ALT: 8 U/L (ref 0–35)
AST: 9 U/L (ref 0–37)
Albumin: 4.1 g/dL (ref 3.5–5.2)
Alkaline Phosphatase: 57 U/L (ref 39–117)
BUN: 23 mg/dL (ref 6–23)
CO2: 25 mEq/L (ref 19–32)
Calcium: 9.9 mg/dL (ref 8.4–10.5)
Chloride: 107 mEq/L (ref 96–112)
Creatinine, Ser: 0.93 mg/dL (ref 0.40–1.20)
GFR: 71.87 mL/min (ref >60.00)
Glucose, Bld: 132 mg/dL — ABNORMAL HIGH (ref 70–99)
Potassium: 4 mEq/L (ref 3.5–5.1)
Sodium: 141 mEq/L (ref 135–145)
Total Bilirubin: 0.2 mg/dL (ref 0.2–1.2)
Total Protein: 7.3 g/dL (ref 6.0–8.3)

## 2018-09-14 LAB — HEMOGLOBIN A1C: Hgb A1c MFr Bld: 6.7 % — ABNORMAL HIGH (ref 4.6–6.5)

## 2018-09-14 MED ORDER — HYDROXYZINE HCL 10 MG PO TABS: 10.0000 mg | ORAL_TABLET | Freq: Three times a day (TID) | ORAL | 0 refills | Status: DC | PRN

## 2018-09-14 NOTE — Progress Notes (Signed)
Lindsay [name] is a 73 y.o. female with the following history as recorded in EpicCare:  Patient Active Problem List   Diagnosis Date Noted  . Ganglion of flexor tendon sheath of right middle finger 07/15/2018  . Type 2 diabetes mellitus without complication, without long-term current use of insulin ([place]) 02/11/2018  . Essential hypertension 02/11/2018  . Hyperlipidemia 02/11/2018  . Cough variant asthma 11/03/2017  . Chest pain 05/17/2016  . Obesity 05/17/2016  . COPD (chronic obstructive pulmonary disease) ([place])   . Acid reflux   . Urinary tract infection, chronic   . Hyperglycemia   . Hypokalemia   . COPD exacerbation ([place]) 01/24/2013    Current Outpatient Medications  Medication Sig Dispense Refill  . aspirin EC 81 MG tablet Take 81 mg by mouth daily.    . budesonide-formoterol (SYMBICORT) 80-4.5 MCG/ACT inhaler Inhale 2 puffs into the lungs 2 (two) times daily. 1 Inhaler 0  . cetirizine (ZYRTEC) 10 MG tablet Take 10 mg by mouth daily.    . Cholecalciferol (VITAMIN D) 50 MCG (2000 UT) tablet Take 1 tablet (2,000 Units total) by mouth daily. 90 tablet 1  . cyclobenzaprine (FLEXERIL) 10 MG tablet Take 1 tablet (10 mg total) by mouth 2 (two) times daily. 30 tablet 0  . HYDROcodone-acetaminophen (NORCO) 5-325 MG tablet Take 1-2 tablets by mouth every 6 (six) hours as needed for moderate pain. 20 tablet 0  . ibuprofen (ADVIL,MOTRIN) 800 MG tablet Take 800 mg by mouth every 8 (eight) hours as needed for mild pain.    Marland Kitchen losartan (COZAAR) 50 MG tablet Take 1 tablet (50 mg total) by mouth daily. 90 tablet 1  . metFORMIN (GLUCOPHAGE) 500 MG tablet Take 1 tablet (500 mg total) by mouth daily. 90 tablet 1  . montelukast (SINGULAIR) 10 MG tablet Take 1 tablet (10 mg total) by mouth daily as needed (congestion). 90 tablet 1  . ondansetron (ZOFRAN ODT) 4 MG disintegrating tablet Take 1 tablet (4 mg total) by mouth every 8 (eight) hours as needed for nausea or vomiting. 10 tablet 0  .  ondansetron (ZOFRAN) 4 MG tablet Take 1 tablet (4 mg total) by mouth every 8 (eight) hours as needed for nausea or vomiting. 20 tablet 0  . oxybutynin (DITROPAN) 5 MG tablet TK 1 T PO BID    . pantoprazole (PROTONIX) 20 MG tablet Take 1 tablet (20 mg total) by mouth daily as needed. For acid reflux 30 tablet 3  . polyvinyl alcohol (LIQUIFILM TEARS) 1.4 % ophthalmic solution Place 1 drop into both eyes as needed for dry eyes (red eyes).    . simvastatin (ZOCOR) 40 MG tablet Take 1 tablet (40 mg total) by mouth at bedtime. 90 tablet 1  . traMADol (ULTRAM) 50 MG tablet Take 1 tablet (50 mg total) by mouth 2 (two) times daily as needed. 30 tablet 1  . triamcinolone cream (KENALOG) 0.1 % Apply 1 application topically 2 (two) times daily. 30 g 0  . trimethoprim (TRIMPEX) 100 MG tablet Take 100 mg by mouth at bedtime.  3  . VITAMIN D PO Take 1 capsule by mouth daily.    . hydrOXYzine (ATARAX/VISTARIL) 10 MG tablet Take 1 tablet (10 mg total) by mouth 3 (three) times daily as needed. 30 tablet 0   No current facility-administered medications for this visit.     Allergies: Codeine, Lisinopril, and Morphine and related  Past Medical History:  Diagnosis Date  . Acid reflux   . Asthma   .  COPD (chronic obstructive pulmonary disease) ([place])   . Hyperglycemia   . Hypokalemia   . Obesity   . Spinal stenosis   . Urinary tract infection, chronic     Past Surgical History:  Procedure Laterality Date  . ABDOMINAL HYSTERECTOMY    . ANKLE FUSION Left   . CHOLECYSTECTOMY      Family History  Problem Relation Age of Onset  . Microcephaly Maternal Grandmother   . Heart disease Maternal Grandmother        Not clear details, patient just knows she took "heart pills"  . Asthma Daughter     Social History   Tobacco Use  . Smoking status: Former Smoker    Packs/day: 0.50    Years: 20.00    Pack years: 10.00    Types: Cigarettes    Quit date: 01/06/1997    Years since quitting: 21.7  . Smokeless  tobacco: Never Used  Substance Use Topics  . Alcohol use: No    Subjective:  3 month follow up on Type 2 Diabetes; patient has excellent control but she prefers to be seen every 3 months; Overdue for colonoscopy- notes she does not want to do colonoscopy- asks about other screening options; Still struggling with eczema patch on upper right arm- did get Rx for Triamcinolone in June but notes she is not using regularly; Denies any chest pain, shortness of breath, blurred vision or headache; denies any concerns for low blood sugar;        Objective:  Vitals:   09/14/18 1032  BP: 114/72  Pulse: 80  Temp: 98.3 F (36.8 C)  TempSrc: Oral  SpO2: 99%  Weight: 212 lb 6.4 oz (96.3 kg)  Height: _0  (1.6 m)    General: Well developed, well nourished, in no acute distress  Skin : Warm and dry. Eczema patch noted on upper right arm;  Head: Normocephalic and atraumatic  Eyes: Sclera and conjunctiva clear; pupils round and reactive to light; extraocular movements intact  Ears: External normal; canals clear; tympanic membranes normal  Oropharynx: Pink, supple. No suspicious lesions  Neck: Supple without thyromegaly, adenopathy  Lungs: Respirations unlabored; clear to auscultation bilaterally without wheeze, rales, rhonchi  CVS exam: normal rate and regular rhythm.  Abdomen: Soft; nontender; nondistended; normoactive bowel sounds; no masses or hepatosplenomegaly  Musculoskeletal: No deformities; no active joint inflammation  Extremities: No edema, cyanosis, clubbing; bilateral foot exam is normal; Neurologic: Alert and oriented; speech intact; face symmetrical; moves all extremities well; CNII-XII intact without focal deficit  Assessment:  1. Type 2 diabetes mellitus without complication, without long-term current use of insulin (HCC)   2. Eczema, unspecified type   3. Colon cancer screening     Plan:  1. Labs updated; last Hgba1c showed excellent control; follow up in 3 months; 2.  Encouraged to use Triamcinolone regularly for 2 weeks; if no improvement, will then refer to dermatology; 3. She defers colonoscopy but agrees to Cologuard.   Return in about 3 months (around 12/14/2018).  Orders Placed This Encounter  Procedures  . CBC w/Diff    Standing Status:   Future    Standing Expiration Date:   09/14/2019  . Comp Met (CMET)    Standing Status:   Future    Standing Expiration Date:   09/14/2019  . HgB A1c    Standing Status:   Future    Standing Expiration Date:   09/14/2019    Requested Prescriptions   Signed Prescriptions Disp Refills  . hydrOXYzine (  ATARAX/VISTARIL) 10 MG tablet 30 tablet 0    Sig: Take 1 tablet (10 mg total) by mouth 3 (three) times daily as needed.

## 2018-09-14 NOTE — Telephone Encounter (Signed)
Ordered today 

## 2018-09-25 DIAGNOSIS — Z1211 Encounter for screening for malignant neoplasm of colon: Secondary | ICD-10-CM | POA: Diagnosis not present

## 2018-09-29 ENCOUNTER — Other Ambulatory Visit: Payer: Self-pay | Admitting: Internal Medicine

## 2018-09-30 NOTE — Telephone Encounter (Signed)
Lindsay [name] pt 

## 2018-10-01 LAB — COLOGUARD: Cologuard: NEGATIVE

## 2018-10-07 ENCOUNTER — Other Ambulatory Visit: Payer: Self-pay | Admitting: Family

## 2018-10-07 DIAGNOSIS — Z1231 Encounter for screening mammogram for malignant neoplasm of breast: Secondary | ICD-10-CM

## 2018-10-12 ENCOUNTER — Encounter: Payer: Self-pay | Admitting: Family

## 2018-10-12 NOTE — Progress Notes (Signed)
Outside notes received. Information abstracted. Notes sent to scan.  

## 2018-10-25 ENCOUNTER — Other Ambulatory Visit: Payer: Self-pay | Admitting: Family

## 2018-10-25 MED ORDER — MONTELUKAST SODIUM 10 MG PO TABS: 10.0000 mg | ORAL_TABLET | Freq: Every day | ORAL | 3 refills | Status: DC | PRN

## 2018-10-25 MED ORDER — SIMVASTATIN 40 MG PO TABS: 40.0000 mg | ORAL_TABLET | Freq: Every day | ORAL | 2 refills | Status: DC

## 2018-11-01 ENCOUNTER — Other Ambulatory Visit: Payer: Self-pay

## 2018-11-01 DIAGNOSIS — Z20822 Contact with and (suspected) exposure to covid-19: Secondary | ICD-10-CM

## 2018-11-02 LAB — NOVEL CORONAVIRUS, NAA: SARS-CoV-2, NAA: NOT DETECTED

## 2018-11-05 ENCOUNTER — Telehealth: Payer: Self-pay | Admitting: Family

## 2018-11-05 NOTE — Telephone Encounter (Signed)
Patient called in and received her covid test results °

## 2018-11-24 ENCOUNTER — Other Ambulatory Visit: Payer: Self-pay

## 2018-11-24 ENCOUNTER — Ambulatory Visit
Admission: RE | Admit: 2018-11-24 | Discharge: 2018-11-24 | Disposition: A | Discharge status: Home / Self Care | Payer: Medicare Other | Source: Ambulatory Visit | Attending: Family | Admitting: Family

## 2018-11-24 DIAGNOSIS — Z1231 Encounter for screening mammogram for malignant neoplasm of breast: Secondary | ICD-10-CM | POA: Diagnosis not present

## 2018-12-14 ENCOUNTER — Other Ambulatory Visit: Payer: Self-pay

## 2018-12-14 ENCOUNTER — Ambulatory Visit (INDEPENDENT_AMBULATORY_CARE_PROVIDER_SITE_OTHER): Payer: Medicare Other | Admitting: Family

## 2018-12-14 ENCOUNTER — Other Ambulatory Visit (INDEPENDENT_AMBULATORY_CARE_PROVIDER_SITE_OTHER): Payer: Medicare Other

## 2018-12-14 ENCOUNTER — Ambulatory Visit: Payer: Medicare Other | Admitting: Family

## 2018-12-14 ENCOUNTER — Encounter: Payer: Self-pay | Admitting: Family

## 2018-12-14 ENCOUNTER — Other Ambulatory Visit: Payer: Medicare Other

## 2018-12-14 VITALS — BP 130/78 | HR 63 | Temp 98.1°F | Ht 63.0 in | Wt 219.4 lb

## 2018-12-14 DIAGNOSIS — R202 Paresthesia of skin: Secondary | ICD-10-CM | POA: Diagnosis not present

## 2018-12-14 DIAGNOSIS — R300 Dysuria: Secondary | ICD-10-CM

## 2018-12-14 DIAGNOSIS — D509 Iron deficiency anemia, unspecified: Secondary | ICD-10-CM

## 2018-12-14 DIAGNOSIS — E119 Type 2 diabetes mellitus without complications: Secondary | ICD-10-CM

## 2018-12-14 DIAGNOSIS — R5383 Other fatigue: Secondary | ICD-10-CM

## 2018-12-14 DIAGNOSIS — S0990XA Unspecified injury of head, initial encounter: Secondary | ICD-10-CM

## 2018-12-14 DIAGNOSIS — R0681 Apnea, not elsewhere classified: Secondary | ICD-10-CM

## 2018-12-14 DIAGNOSIS — R200 Anesthesia of skin: Secondary | ICD-10-CM | POA: Diagnosis not present

## 2018-12-14 DIAGNOSIS — K219 Gastro-esophageal reflux disease without esophagitis: Secondary | ICD-10-CM

## 2018-12-14 LAB — POC URINALSYSI DIPSTICK (AUTOMATED)
Bilirubin, UA: NEGATIVE
Blood, UA: NEGATIVE
Glucose, UA: NEGATIVE
Ketones, UA: NEGATIVE
Nitrite, UA: NEGATIVE
Protein, UA: NEGATIVE
Spec Grav, UA: 1.02 (ref 1.010–1.025)
Urobilinogen, UA: 0.2 E.U./dL
pH, UA: 6 (ref 5.0–8.0)

## 2018-12-14 LAB — CBC WITH DIFFERENTIAL/PLATELET
Basophils Absolute: 0.1 10*3/uL (ref 0.0–0.1)
Basophils Relative: 1.1 % (ref 0.0–3.0)
Eosinophils Absolute: 0.4 10*3/uL (ref 0.0–0.7)
Eosinophils Relative: 4 % (ref 0.0–5.0)
HCT: 34.3 % — ABNORMAL LOW (ref 36.0–46.0)
Hemoglobin: 10.9 g/dL — ABNORMAL LOW (ref 12.0–15.0)
Lymphocytes Relative: 28.9 % (ref 12.0–46.0)
Lymphs Abs: 3 10*3/uL (ref 0.7–4.0)
MCHC: 31.7 g/dL (ref 30.0–36.0)
MCV: 82 fl (ref 78.0–100.0)
Monocytes Absolute: 0.7 10*3/uL (ref 0.1–1.0)
Monocytes Relative: 7 % (ref 3.0–12.0)
Neutro Abs: 6.1 10*3/uL (ref 1.4–7.7)
Neutrophils Relative %: 59 % (ref 43.0–77.0)
Platelets: 384 10*3/uL (ref 150.0–400.0)
RBC: 4.18 Mil/uL (ref 3.87–5.11)
RDW: 14.9 % (ref 11.5–15.5)
WBC: 10.4 10*3/uL (ref 4.0–10.5)

## 2018-12-14 LAB — COMPREHENSIVE METABOLIC PANEL
ALT: 10 U/L (ref 0–35)
AST: 11 U/L (ref 0–37)
Albumin: 4.3 g/dL (ref 3.5–5.2)
Alkaline Phosphatase: 60 U/L (ref 39–117)
BUN: 21 mg/dL (ref 6–23)
CO2: 27 mEq/L (ref 19–32)
Calcium: 10.3 mg/dL (ref 8.4–10.5)
Chloride: 104 mEq/L (ref 96–112)
Creatinine, Ser: 0.9 mg/dL (ref 0.40–1.20)
GFR: 74.59 mL/min (ref >60.00)
Glucose, Bld: 94 mg/dL (ref 70–99)
Potassium: 4.1 mEq/L (ref 3.5–5.1)
Sodium: 138 mEq/L (ref 135–145)
Total Bilirubin: 0.3 mg/dL (ref 0.2–1.2)
Total Protein: 7.5 g/dL (ref 6.0–8.3)

## 2018-12-14 LAB — MAGNESIUM: Magnesium: 1.9 mg/dL (ref 1.5–2.5)

## 2018-12-14 LAB — TSH: TSH: 25.71 u[IU]/mL — ABNORMAL HIGH (ref 0.35–4.50)

## 2018-12-14 MED ORDER — CYCLOBENZAPRINE HCL 10 MG PO TABS: 10.0000 mg | ORAL_TABLET | Freq: Two times a day (BID) | ORAL | 0 refills | Status: DC

## 2018-12-14 NOTE — Progress Notes (Signed)
Lindsay [name] is a 73 y.o. female with the following history as recorded in EpicCare:  Patient Active Problem List   Diagnosis Date Noted  . Ganglion of flexor tendon sheath of right middle finger 07/15/2018  . Type 2 diabetes mellitus without complication, without long-term current use of insulin ([place]) 02/11/2018  . Essential hypertension 02/11/2018  . Hyperlipidemia 02/11/2018  . Cough variant asthma 11/03/2017  . Chest pain 05/17/2016  . Obesity 05/17/2016  . COPD (chronic obstructive pulmonary disease) ([place])   . Acid reflux   . Urinary tract infection, chronic   . Hyperglycemia   . Hypokalemia   . COPD exacerbation ([place]) 01/24/2013    Current Outpatient Medications  Medication Sig Dispense Refill  . aspirin EC 81 MG tablet Take 81 mg by mouth daily.    . budesonide-formoterol (SYMBICORT) 80-4.5 MCG/ACT inhaler Inhale 2 puffs into the lungs 2 (two) times daily. 1 Inhaler 0  . cetirizine (ZYRTEC) 10 MG tablet Take 10 mg by mouth daily.    . Cholecalciferol (VITAMIN D) 50 MCG (2000 UT) tablet Take 1 tablet (2,000 Units total) by mouth daily. 90 tablet 1  . Cholecalciferol (VITAMIN D3) 50 MCG (2000 UT) capsule Take 2,000 Units by mouth daily.    . cyclobenzaprine (FLEXERIL) 10 MG tablet Take 1 tablet (10 mg total) by mouth 2 (two) times daily. 60 tablet 0  . HYDROcodone-acetaminophen (NORCO) 5-325 MG tablet Take 1-2 tablets by mouth every 6 (six) hours as needed for moderate pain. 20 tablet 0  . hydrOXYzine (ATARAX/VISTARIL) 10 MG tablet Take 1 tablet (10 mg total) by mouth 3 (three) times daily as needed. 30 tablet 0  . ibuprofen (ADVIL,MOTRIN) 800 MG tablet Take 800 mg by mouth every 8 (eight) hours as needed for mild pain.    Marland Kitchen losartan (COZAAR) 50 MG tablet Take 1 tablet (50 mg total) by mouth daily. 90 tablet 1  . metFORMIN (GLUCOPHAGE) 500 MG tablet TAKE 1 TABLET(500 MG) BY MOUTH DAILY 90 tablet 1  . montelukast (SINGULAIR) 10 MG tablet Take 1 tablet (10 mg total) by mouth  daily as needed (congestion). 90 tablet 3  . ondansetron (ZOFRAN ODT) 4 MG disintegrating tablet Take 1 tablet (4 mg total) by mouth every 8 (eight) hours as needed for nausea or vomiting. 10 tablet 0  . ondansetron (ZOFRAN) 4 MG tablet Take 1 tablet (4 mg total) by mouth every 8 (eight) hours as needed for nausea or vomiting. 20 tablet 0  . oxybutynin (DITROPAN) 5 MG tablet TK 1 T PO BID    . pantoprazole (PROTONIX) 20 MG tablet Take 1 tablet (20 mg total) by mouth daily as needed. For acid reflux 30 tablet 3  . polyvinyl alcohol (LIQUIFILM TEARS) 1.4 % ophthalmic solution Place 1 drop into both eyes as needed for dry eyes (red eyes).    . simvastatin (ZOCOR) 40 MG tablet Take 1 tablet (40 mg total) by mouth at bedtime. 90 tablet 2  . traMADol (ULTRAM) 50 MG tablet Take 1 tablet (50 mg total) by mouth 2 (two) times daily as needed. 30 tablet 1  . triamcinolone cream (KENALOG) 0.1 % Apply 1 application topically 2 (two) times daily. 30 g 0  . trimethoprim (TRIMPEX) 100 MG tablet Take 100 mg by mouth at bedtime.  3  . VITAMIN D PO Take 1 capsule by mouth daily.     No current facility-administered medications for this visit.     Allergies: Codeine, Lisinopril, and Morphine and related  Past  Medical History:  Diagnosis Date  . Acid reflux   . Asthma   . COPD (chronic obstructive pulmonary disease) ([place])   . Hyperglycemia   . Hypokalemia   . Obesity   . Spinal stenosis   . Urinary tract infection, chronic     Past Surgical History:  Procedure Laterality Date  . ABDOMINAL HYSTERECTOMY    . ANKLE FUSION Left   . CHOLECYSTECTOMY      Family History  Problem Relation Age of Onset  . Microcephaly Maternal Grandmother   . Heart disease Maternal Grandmother        Not clear details, patient just knows she took "heart pills"  . Asthma Daughter     Social History   Tobacco Use  . Smoking status: Former Smoker    Packs/day: 0.50    Years: 20.00    Pack years: 10.00    Types:  Cigarettes    Quit date: 01/06/1997    Years since quitting: 21.9  . Smokeless tobacco: Never Used  Substance Use Topics  . Alcohol use: No    Subjective:   3 month follow-up on Type 2 Diabetes; patient has excellent control and prefers to be seen every 3 months to labs/ check in; in baseline state of health today;  1) Requesting to get checked for UTI today; thought her urine looked dark/ had a "strong odor." 2) Notes that she is having "pain" all over her body- chronic back pain; under care of orthopedist at [facility]; last procedure was done in July 2020; did not contact her back specialist to let them know how much more pain she has been in in the past few months;  3) Wants to get her labs updated for diabetes;  4) Notes that she woke up last month one time feeling that "she was gasping for air."      Objective:  Vitals:   12/14/18 1551  BP: 130/78  Pulse: 63  Temp: 98.1 F (36.7 C)  TempSrc: Oral  SpO2: 99%  Weight: 219 lb 6.4 oz (99.5 kg)  Height: _0  (1.6 m)    General: Well developed, well nourished, in no acute distress  Skin : Warm and dry.  Head: Normocephalic and atraumatic  Eyes: Sclera and conjunctiva clear; pupils round and reactive to light; extraocular movements intact  Ears: External normal; canals clear; tympanic membranes normal  Oropharynx: Pink, supple. No suspicious lesions  Neck: Supple without thyromegaly, adenopathy  Lungs: Respirations unlabored; clear to auscultation bilaterally without wheeze, rales, rhonchi  CVS exam: normal rate and regular rhythm.  Musculoskeletal: No deformities; no active joint inflammation  Extremities: No edema, cyanosis, clubbing  Vessels: Symmetric bilaterally  Neurologic: Alert and oriented; speech intact; face symmetrical; moves all extremities well; CNII-XII intact without focal deficit   Assessment:  1. Dysuria   2. Type 2 diabetes mellitus without complication, without long-term current use of insulin  ([place])   3. Numbness and tingling   4. Other fatigue   5. Witnessed episode of apnea   6. Injury of head, initial encounter   7. Gastroesophageal reflux disease, unspecified whether esophagitis present     Plan:  1. Check U/A and urine culture; 2. Check Hgba1c today; 3. Check B12, magnesium level today; follow-up to be determined; she is encouraged to follow up with her back specialist- agree she needs further imaging but will let them manage; 4. Check TSH today; 5. Refer for sleep study;  6. Check head CT per patient request- head injury  occurred in October; 7. Encouraged to use her Protonix regularly;   This visit occurred during the SARS-CoV-2 public health emergency.  Safety protocols were in place, including screening questions prior to the visit, additional usage of staff PPE, and extensive cleaning of exam room while observing appropriate contact time as indicated for disinfecting solutions.    No follow-ups on file.  Orders Placed This Encounter  Procedures  . Urine Culture    Standing Status:   Future    Number of Occurrences:   1    Standing Expiration Date:   12/14/2019  . CT Head Wo Contrast    Standing Status:   Future    Standing Expiration Date:   03/13/2020    Order Specific Question:   Preferred imaging location?    Answer:   [place]    Order Specific Question:   Radiology Contrast Protocol - do NOT remove file path    Answer:   [email or web address]  . Magnesium    Standing Status:   Future    Number of Occurrences:   1    Standing Expiration Date:   12/14/2019  . B12    Standing Status:   Future    Number of Occurrences:   1    Standing Expiration Date:   12/14/2019  . TSH    Standing Status:   Future    Number of Occurrences:   1    Standing Expiration Date:   12/14/2019  . HgB A1c    Standing Status:   Future    Number of Occurrences:   1    Standing Expiration Date:   12/14/2019  . Comp Met (CMET)    Standing  Status:   Future    Number of Occurrences:   1    Standing Expiration Date:   12/14/2019  . Ambulatory referral to Neurology    Referral Priority:   Routine    Referral Type:   Consultation    Referral Reason:   Specialty Services Required    Requested Specialty:   Neurology    Number of Visits Requested:   1  . POCT Urinalysis Dipstick (Automated)    Requested Prescriptions   Signed Prescriptions Disp Refills  . cyclobenzaprine (FLEXERIL) 10 MG tablet 60 tablet 0    Sig: Take 1 tablet (10 mg total) by mouth 2 (two) times daily.

## 2018-12-15 ENCOUNTER — Other Ambulatory Visit: Payer: Self-pay | Admitting: Family

## 2018-12-15 DIAGNOSIS — R1310 Dysphagia, unspecified: Secondary | ICD-10-CM

## 2018-12-15 DIAGNOSIS — E049 Nontoxic goiter, unspecified: Secondary | ICD-10-CM

## 2018-12-15 DIAGNOSIS — E039 Hypothyroidism, unspecified: Secondary | ICD-10-CM

## 2018-12-15 LAB — IRON,TIBC AND FERRITIN PANEL
%SAT: 13 % (calc) — ABNORMAL LOW (ref 16–45)
Ferritin: 110 ng/mL (ref 16–288)
Iron: 37 ug/dL — ABNORMAL LOW (ref 45–160)
TIBC: 291 mcg/dL (calc) (ref 250–450)

## 2018-12-15 LAB — HEMOGLOBIN A1C: Hgb A1c MFr Bld: 6.7 % — ABNORMAL HIGH (ref 4.6–6.5)

## 2018-12-15 LAB — VITAMIN B12: Vitamin B-12: 620 pg/mL (ref 211–911)

## 2018-12-15 MED ORDER — LEVOTHYROXINE SODIUM 50 MCG PO TABS: 50.0000 ug | ORAL_TABLET | Freq: Every day | ORAL | 3 refills | Status: DC

## 2018-12-16 ENCOUNTER — Other Ambulatory Visit: Payer: Self-pay | Admitting: Family

## 2018-12-16 LAB — URINE CULTURE
MICRO NUMBER:: 1175723
SPECIMEN QUALITY:: ADEQUATE

## 2018-12-16 MED ORDER — CEFUROXIME AXETIL 500 MG PO TABS: 500.0000 mg | ORAL_TABLET | Freq: Two times a day (BID) | ORAL | 0 refills | Status: DC

## 2018-12-21 ENCOUNTER — Telehealth: Payer: Self-pay

## 2018-12-21 NOTE — Telephone Encounter (Signed)
Spoke with patient today. 

## 2018-12-22 ENCOUNTER — Institutional Professional Consult (permissible substitution): Payer: Self-pay | Admitting: Neurology

## 2018-12-27 ENCOUNTER — Ambulatory Visit
Admission: RE | Admit: 2018-12-27 | Discharge: 2018-12-27 | Disposition: A | Discharge status: Home / Self Care | Payer: Medicare Other | Source: Ambulatory Visit | Attending: Family | Admitting: Family

## 2018-12-27 DIAGNOSIS — E049 Nontoxic goiter, unspecified: Secondary | ICD-10-CM

## 2018-12-27 DIAGNOSIS — E039 Hypothyroidism, unspecified: Secondary | ICD-10-CM | POA: Diagnosis not present

## 2018-12-27 DIAGNOSIS — S0990XA Unspecified injury of head, initial encounter: Secondary | ICD-10-CM | POA: Diagnosis not present

## 2018-12-27 DIAGNOSIS — R1310 Dysphagia, unspecified: Secondary | ICD-10-CM

## 2018-12-29 ENCOUNTER — Other Ambulatory Visit: Payer: Self-pay | Admitting: Family

## 2018-12-29 DIAGNOSIS — E039 Hypothyroidism, unspecified: Secondary | ICD-10-CM

## 2018-12-30 ENCOUNTER — Other Ambulatory Visit: Payer: Self-pay | Admitting: Family

## 2018-12-30 DIAGNOSIS — R300 Dysuria: Secondary | ICD-10-CM

## 2019-01-03 ENCOUNTER — Telehealth: Payer: Self-pay

## 2019-01-03 NOTE — Telephone Encounter (Signed)
Copied from [place] [id number]. Topic: General - Other >> Jan 03, 2019  2:05 PM Rainey Pines A wrote: Patient would like a callback from [facility] today. Patient did not wish to disclose what the call is in regards to. Please advise

## 2019-01-03 NOTE — Telephone Encounter (Signed)
Spoke with patient today and questions answered. 

## 2019-01-05 ENCOUNTER — Other Ambulatory Visit: Payer: Self-pay

## 2019-01-05 ENCOUNTER — Institutional Professional Consult (permissible substitution): Payer: Self-pay | Admitting: Neurology

## 2019-01-05 ENCOUNTER — Other Ambulatory Visit: Payer: Medicare Other

## 2019-01-05 DIAGNOSIS — R300 Dysuria: Secondary | ICD-10-CM | POA: Diagnosis not present

## 2019-01-06 ENCOUNTER — Other Ambulatory Visit: Payer: Self-pay

## 2019-01-07 LAB — URINE CULTURE

## 2019-01-10 ENCOUNTER — Other Ambulatory Visit: Payer: Self-pay | Admitting: Family

## 2019-01-10 ENCOUNTER — Ambulatory Visit: Payer: Medicare Other | Admitting: Internal Medicine

## 2019-01-10 MED ORDER — AMOXICILLIN-POT CLAVULANATE 875-125 MG PO TABS: 1.0000 | ORAL_TABLET | Freq: Two times a day (BID) | ORAL | 0 refills | Status: DC

## 2019-01-10 NOTE — Progress Notes (Deleted)
Name: Lindsay [name]  MRN/ DOB: [id number], [birth year]    Age/ Sex: 74 y.o., female    PCP: Olive Bass, FNP   Reason for Endocrinology Evaluation:      Date of Initial Endocrinology Evaluation: 01/10/2019     HPI: Lindsay [name] is a 74 y.o. female with a past medical history of HTN, Hypothyroidism and T2Dm . The patient presented for initial endocrinology clinic visit on 01/10/2019 for consultative assistance with her hypothyroidism.   ***  HISTORY:  Past Medical History:  Past Medical History:  Diagnosis Date  . Acid reflux   . Asthma   . COPD (chronic obstructive pulmonary disease) (HCC)   . Hyperglycemia   . Hypokalemia   . Obesity   . Spinal stenosis   . Urinary tract infection, chronic     Past Surgical History:  Past Surgical History:  Procedure Laterality Date  . ABDOMINAL HYSTERECTOMY    . ANKLE FUSION Left   . CHOLECYSTECTOMY        Social History:  reports that she quit smoking about 22 years ago. Her smoking use included cigarettes. She has a 10.00 pack-year smoking history. She has never used smokeless tobacco. She reports that she does not drink alcohol or use drugs.  Family History: family history includes Asthma in her daughter; Heart disease in her maternal grandmother; Microcephaly in her maternal grandmother.   HOME MEDICATIONS: Allergies as of 01/10/2019      Reactions   Codeine Nausea Only   Lisinopril    cough   Morphine And Related Itching      Medication List       Accurate as of January 10, 2019  1:10 PM. If you have any questions, ask your nurse or doctor.        aspirin EC 81 MG tablet Take 81 mg by mouth daily.   budesonide-formoterol 80-4.5 MCG/ACT inhaler Commonly known as: Symbicort Inhale 2 puffs into the lungs 2 (two) times daily.   cefUROXime 500 MG tablet Commonly known as: Ceftin Take 1 tablet (500 mg total) by mouth 2 (two) times daily with a meal.   cetirizine 10 MG tablet Commonly known as:  ZYRTEC Take 10 mg by mouth daily.   cyclobenzaprine 10 MG tablet Commonly known as: FLEXERIL Take 1 tablet (10 mg total) by mouth 2 (two) times daily.   HYDROcodone-acetaminophen 5-325 MG tablet Commonly known as: Norco Take 1-2 tablets by mouth every 6 (six) hours as needed for moderate pain.   hydrOXYzine 10 MG tablet Commonly known as: ATARAX/VISTARIL Take 1 tablet (10 mg total) by mouth 3 (three) times daily as needed.   ibuprofen 800 MG tablet Commonly known as: ADVIL Take 800 mg by mouth every 8 (eight) hours as needed for mild pain.   levothyroxine 50 MCG tablet Commonly known as: SYNTHROID Take 1 tablet (50 mcg total) by mouth daily.   losartan 50 MG tablet Commonly known as: COZAAR Take 1 tablet (50 mg total) by mouth daily.   metFORMIN 500 MG tablet Commonly known as: GLUCOPHAGE TAKE 1 TABLET(500 MG) BY MOUTH DAILY   montelukast 10 MG tablet Commonly known as: SINGULAIR Take 1 tablet (10 mg total) by mouth daily as needed (congestion).   ondansetron 4 MG disintegrating tablet Commonly known as: Zofran ODT Take 1 tablet (4 mg total) by mouth every 8 (eight) hours as needed for nausea or vomiting.   ondansetron 4 MG tablet Commonly known as: Zofran Take 1 tablet (4  mg total) by mouth every 8 (eight) hours as needed for nausea or vomiting.   oxybutynin 5 MG tablet Commonly known as: DITROPAN TK 1 T PO BID   pantoprazole 20 MG tablet Commonly known as: PROTONIX Take 1 tablet (20 mg total) by mouth daily as needed. For acid reflux   polyvinyl alcohol 1.4 % ophthalmic solution Commonly known as: LIQUIFILM TEARS Place 1 drop into both eyes as needed for dry eyes (red eyes).   simvastatin 40 MG tablet Commonly known as: ZOCOR Take 1 tablet (40 mg total) by mouth at bedtime.   traMADol 50 MG tablet Commonly known as: ULTRAM Take 1 tablet (50 mg total) by mouth 2 (two) times daily as needed.   triamcinolone cream 0.1 % Commonly known as: KENALOG Apply  1 application topically 2 (two) times daily.   trimethoprim 100 MG tablet Commonly known as: TRIMPEX Take 100 mg by mouth at bedtime.   Vitamin D 50 MCG (2000 UT) tablet Take 1 tablet (2,000 Units total) by mouth daily.   Vitamin D3 50 MCG (2000 UT) capsule Take 2,000 Units by mouth daily.   VITAMIN D PO Take 1 capsule by mouth daily.         REVIEW OF SYSTEMS: A comprehensive ROS was conducted with the patient and is negative except as per HPI and below:  ROS     OBJECTIVE:  VS: There were no vitals taken for this visit.   Wt Readings from Last 3 Encounters:  12/14/18 219 lb 6.4 oz (99.5 kg)  09/14/18 212 lb 6.4 oz (96.3 kg)  07/27/18 209 lb (94.8 kg)     EXAM: General: Pt appears well and is in NAD  Hydration: Well-hydrated with moist mucous membranes and good skin turgor  Eyes: External eye exam normal without stare, lid lag or exophthalmos.  EOM intact.  PERRL.  Ears, Nose, Throat: Hearing: Grossly intact bilaterally Dental: Good dentition  Throat: Clear without mass, erythema or exudate  Neck: General: Supple without adenopathy. Thyroid: Thyroid size normal.  No goiter or nodules appreciated. No thyroid bruit.  Lungs: Clear with good BS bilat with no rales, rhonchi, or wheezes  Heart: Auscultation: RRR.  Abdomen: Normoactive bowel sounds, soft, nontender, without masses or organomegaly palpable  Extremities: Gait and station: Normal gait  Digits and nails: No clubbing, cyanosis, petechiae, or nodes Head and neck: Normal alignment and mobility BL UE: Normal ROM and strength. BL LE: No pretibial edema normal ROM and strength.  Skin: Hair: Texture and amount normal with gender appropriate distribution Skin Inspection: No rashes, acanthosis nigricans/skin tags. No lipohypertrophy Skin Palpation: Skin temperature, texture, and thickness normal to palpation  Neuro: Cranial nerves: II - XII grossly intact  Cerebellar: Normal coordination and movement; no  tremor Motor: Normal strength throughout DTRs: 2+ and symmetric in UE without delay in relaxation phase  Mental Status: Judgment, insight: Intact Orientation: Oriented to time, place, and person Memory: Intact for recent and remote events Mood and affect: No depression, anxiety, or agitation     DATA REVIEWED: ***    ASSESSMENT/PLAN/RECOMMENDATIONS:   1. ***    Medications :  Signed electronically by: Lyndle Herrlich, MD  [facility]  [facility] [address] [place] [place], [place] [address] Phone: [phone] FAX: [phone]   CC: Olive Bass, FNP [address] [place] [address] Phone: [phone] Fax: [phone]   Return to Endocrinology clinic as below: Future Appointments  Date Time Provider Department Center  01/10/2019  2:40 PM Tynesha Free, Melanie Crazier, MD [id number] None

## 2019-01-11 ENCOUNTER — Encounter: Payer: Self-pay | Admitting: Internal Medicine

## 2019-01-11 ENCOUNTER — Ambulatory Visit (INDEPENDENT_AMBULATORY_CARE_PROVIDER_SITE_OTHER): Payer: Medicare Other | Admitting: Internal Medicine

## 2019-01-11 ENCOUNTER — Other Ambulatory Visit: Payer: Self-pay | Admitting: Family

## 2019-01-11 VITALS — BP 118/62 | HR 77 | Temp 98.8°F | Ht 63.0 in | Wt 213.6 lb

## 2019-01-11 DIAGNOSIS — R197 Diarrhea, unspecified: Secondary | ICD-10-CM

## 2019-01-11 DIAGNOSIS — D649 Anemia, unspecified: Secondary | ICD-10-CM | POA: Diagnosis not present

## 2019-01-11 DIAGNOSIS — E039 Hypothyroidism, unspecified: Secondary | ICD-10-CM

## 2019-01-11 LAB — CBC
HCT: 32.2 % — ABNORMAL LOW (ref 36.0–46.0)
Hemoglobin: 10.5 g/dL — ABNORMAL LOW (ref 12.0–15.0)
MCHC: 32.5 g/dL (ref 30.0–36.0)
MCV: 81.4 fl (ref 78.0–100.0)
Platelets: 372 10*3/uL (ref 150.0–400.0)
RBC: 3.96 Mil/uL (ref 3.87–5.11)
RDW: 14.8 % (ref 11.5–15.5)
WBC: 8.3 10*3/uL (ref 4.0–10.5)

## 2019-01-11 LAB — T4, FREE: Free T4: 0.67 ng/dL (ref 0.60–1.60)

## 2019-01-11 LAB — TSH: TSH: 13.12 u[IU]/mL — ABNORMAL HIGH (ref 0.35–4.50)

## 2019-01-11 MED ORDER — AMOXICILLIN-POT CLAVULANATE 875-125 MG PO TABS: 1.0000 | ORAL_TABLET | Freq: Two times a day (BID) | ORAL | 0 refills | Status: AC

## 2019-01-11 NOTE — Progress Notes (Signed)
Name: Lindsay [name]  MRN/ DOB: [id number], [birth year]    Age/ Sex: 74 y.o., female    PCP: Marrian Salvage, FNP   Reason for Endocrinology Evaluation:      Date of Initial Endocrinology Evaluation: 01/11/2019     HPI: Lindsay [name] is a 74 y.o. female with a past medical history of HTN, Hypothyroidism and T2Dm . The patient presented for initial endocrinology clinic visit on 01/11/2019 for consultative assistance with her hypothyroidism.   Pt was diagnosed with hypothyroidism in 12/2018 with a TSH 25.71 uIU/mL . Was started on levothyroxine 50 mcg but stopped it due to diarrhea.   Pt did notice weight gain, and constipation for a few months prior to her presentation in December. She has occasional depression   She was started on Abx in 12/2018.  Lost daughters in 2013 and 2018.   HISTORY:  Past Medical History:  Past Medical History:  Diagnosis Date  . Acid reflux   . Asthma   . COPD (chronic obstructive pulmonary disease) ([place])   . Hyperglycemia   . Hypokalemia   . Obesity   . Spinal stenosis   . Urinary tract infection, chronic    Past Surgical History:  Past Surgical History:  Procedure Laterality Date  . ABDOMINAL HYSTERECTOMY    . ANKLE FUSION Left   . CHOLECYSTECTOMY        Social History:  reports that she quit smoking about 22 years ago. Her smoking use included cigarettes. She has a 10.00 pack-year smoking history. She has never used smokeless tobacco. She reports that she does not drink alcohol or use drugs.  Family History: family history includes Asthma in her daughter; Heart disease in her maternal grandmother; Microcephaly in her maternal grandmother.   HOME MEDICATIONS: Allergies as of 01/11/2019      Reactions   Codeine Nausea Only   Lisinopril    cough   Morphine And Related Itching      Medication List       Accurate as of January 11, 2019 11:05 AM. If you have any questions, ask your nurse or doctor.        STOP taking  these medications   amoxicillin-clavulanate 875-125 MG tablet Commonly known as: AUGMENTIN Stopped by: Dorita Sciara, MD   HYDROcodone-acetaminophen 5-325 MG tablet Commonly known as: Norco Stopped by: Dorita Sciara, MD   ondansetron 4 MG disintegrating tablet Commonly known as: Zofran ODT Stopped by: Dorita Sciara, MD   ondansetron 4 MG tablet Commonly known as: Zofran Stopped by: Dorita Sciara, MD     TAKE these medications   aspirin EC 81 MG tablet Take 81 mg by mouth daily.   budesonide-formoterol 80-4.5 MCG/ACT inhaler Commonly known as: Symbicort Inhale 2 puffs into the lungs 2 (two) times daily.   cefUROXime 500 MG tablet Commonly known as: Ceftin Take 1 tablet (500 mg total) by mouth 2 (two) times daily with a meal.   cetirizine 10 MG tablet Commonly known as: ZYRTEC Take 10 mg by mouth daily.   cyclobenzaprine 10 MG tablet Commonly known as: FLEXERIL Take 1 tablet (10 mg total) by mouth 2 (two) times daily.   hydrOXYzine 10 MG tablet Commonly known as: ATARAX/VISTARIL Take 1 tablet (10 mg total) by mouth 3 (three) times daily as needed.   ibuprofen 800 MG tablet Commonly known as: ADVIL Take 800 mg by mouth every 8 (eight) hours as needed for mild pain.   levothyroxine 50  MCG tablet Commonly known as: SYNTHROID Take 1 tablet (50 mcg total) by mouth daily.   losartan 50 MG tablet Commonly known as: COZAAR Take 1 tablet (50 mg total) by mouth daily.   metFORMIN 500 MG tablet Commonly known as: GLUCOPHAGE TAKE 1 TABLET(500 MG) BY MOUTH DAILY   montelukast 10 MG tablet Commonly known as: SINGULAIR Take 1 tablet (10 mg total) by mouth daily as needed (congestion).   oxybutynin 5 MG tablet Commonly known as: DITROPAN TK 1 T PO BID   pantoprazole 20 MG tablet Commonly known as: PROTONIX Take 1 tablet (20 mg total) by mouth daily as needed. For acid reflux   polyvinyl alcohol 1.4 % ophthalmic solution Commonly known  as: LIQUIFILM TEARS Place 1 drop into both eyes as needed for dry eyes (red eyes).   simvastatin 40 MG tablet Commonly known as: ZOCOR Take 1 tablet (40 mg total) by mouth at bedtime.   traMADol 50 MG tablet Commonly known as: ULTRAM Take 1 tablet (50 mg total) by mouth 2 (two) times daily as needed.   triamcinolone cream 0.1 % Commonly known as: KENALOG Apply 1 application topically 2 (two) times daily.   trimethoprim 100 MG tablet Commonly known as: TRIMPEX Take 100 mg by mouth at bedtime.   Vitamin D 50 MCG (2000 UT) tablet Take 1 tablet (2,000 Units total) by mouth daily.   Vitamin D3 50 MCG (2000 UT) capsule Take 2,000 Units by mouth daily.   VITAMIN D PO Take 1 capsule by mouth daily.         REVIEW OF SYSTEMS: A comprehensive ROS was conducted with the patient and is negative except as per HPI and below:  Review of Systems  Constitutional: Positive for malaise/fatigue. Negative for chills, fever and weight loss.  Cardiovascular: Negative for chest pain and palpitations.  Gastrointestinal: Positive for diarrhea. Negative for nausea.  Psychiatric/Behavioral: Positive for depression. The patient is not nervous/anxious.        OBJECTIVE:  VS: BP 118/62 (BP Location: Left Arm, Patient Position: Sitting, Cuff Size: Large)   Pulse 77   Temp 98.8 F (37.1 C)   Ht 5\' 3"  (1.6 m)   Wt 213 lb 9.6 oz (96.9 kg)   SpO2 97%   BMI 37.84 kg/m    Wt Readings from Last 3 Encounters:  01/11/19 213 lb 9.6 oz (96.9 kg)  12/14/18 219 lb 6.4 oz (99.5 kg)  09/14/18 212 lb 6.4 oz (96.3 kg)     EXAM: General: Pt appears well and is in NAD  Eyes: External eye exam normal without stare, lid lag or exophthalmos.  EOM intact.    Neck: General: Supple without adenopathy. Thyroid: Thyroid size normal.  No goiter or nodules appreciated. No thyroid bruit.  Lungs: Clear with good BS bilat with no rales, rhonchi, or wheezes  Heart: Auscultation: RRR.  Abdomen: Normoactive bowel  sounds, soft, nontender, without masses or organomegaly palpable  Extremities:  BL LE: No pretibial edema normal ROM and strength.  Skin: Hair: Texture and amount normal with gender appropriate distribution Skin Inspection: No rashes, acanthosis nigricans/skin tags. No lipohypertrophy Skin Palpation: Skin temperature, texture, and thickness normal to palpation  Neuro: Cranial nerves: II - XII grossly intact  Motor: Normal strength throughout DTRs: 2+ and symmetric in UE without delay in relaxation phase  Mental Status: Judgment, insight: Intact Orientation: Oriented to time, place, and person Mood and affect: No depression, anxiety, or agitation     DATA REVIEWED:   Results for  Lindsay, [name] (MRN [id number]) as of 01/12/2019 13:15  Ref. Range 12/14/2018 16:36 01/11/2019 10:55  TSH Latest Ref Range: 0.35 - 4.50 uIU/mL 25.71 (H) 13.12 (H)  T4,Free(Direct) Latest Ref Range: 0.60 - 1.60 ng/dL  2.13    ASSESSMENT/PLAN/RECOMMENDATIONS:   1. Hypothyroidism:  - Clinically she is hypothyroid - No local neck symptoms - I have encouraged her to restart her LT- 4 replacement , I have explained to her that levothyroxine does not cause diarrhea unless she is on a high dose, but a 50 mcg is not a high dose.  - I have advised her that if she is skeptical about it, she could start with half a tablet.  - - Pt educated extensively on the correct way to take levothyroxine (first thing in the morning with water, 30 minutes before eating or taking other medications). - Pt encouraged to double dose the following day if she were to miss a dose given long half-life of levothyroxine.   Medications : Levothyroxine 50 mcg, half a tablet daily    2. Diarrhea:   - I suspect this is related to antibiotic use. This seems to be improving. I have encouraged her to use probiotics but if symptoms persist or worsen, she will need to contact PCP and consider C.Diff testing if clinically indicated at the time.    Signed electronically by: Lyndle Herrlich, MD  [facility] Endocrinology  [facility] [address] [place], [place] [address] Phone: [phone] FAX: [phone]   CC: Olive Bass, FNP [address] [place] [address] Phone: [phone] Fax: [phone]   Return to Endocrinology clinic as below: Future Appointments  Date Time Provider Department Center  02/24/2019 10:30 AM [facility] None  05/12/2019 10:30 AM Grayden Burley, Konrad Dolores, MD [facility] None

## 2019-01-11 NOTE — Patient Instructions (Signed)

## 2019-01-12 ENCOUNTER — Encounter: Payer: Self-pay | Admitting: Internal Medicine

## 2019-01-12 DIAGNOSIS — E039 Hypothyroidism, unspecified: Secondary | ICD-10-CM | POA: Insufficient documentation

## 2019-01-17 ENCOUNTER — Telehealth: Payer: Self-pay

## 2019-01-17 NOTE — Telephone Encounter (Signed)
Pt wanted to inform you that she started back taking her levothyroxine on yesterday and that she was placed on a higher dosage of antibiotics and will complete with that on Wednesday.

## 2019-01-21 ENCOUNTER — Telehealth: Payer: Self-pay

## 2019-01-21 NOTE — Telephone Encounter (Signed)
Called and left message for patient that her appointment was moved to the lab for her to come in and drop off her urine specimen and no appointment needed with [place].

## 2019-01-24 ENCOUNTER — Other Ambulatory Visit: Payer: Self-pay | Admitting: Family

## 2019-01-24 ENCOUNTER — Other Ambulatory Visit: Payer: Medicare Other | Admitting: Family

## 2019-01-24 ENCOUNTER — Other Ambulatory Visit: Payer: Medicare Other

## 2019-01-24 ENCOUNTER — Other Ambulatory Visit: Payer: Self-pay

## 2019-01-24 DIAGNOSIS — R300 Dysuria: Secondary | ICD-10-CM

## 2019-01-25 LAB — URINE CULTURE

## 2019-01-28 ENCOUNTER — Ambulatory Visit: Payer: Medicare Other | Admitting: Family

## 2019-02-22 ENCOUNTER — Telehealth: Payer: Self-pay

## 2019-02-22 ENCOUNTER — Other Ambulatory Visit: Payer: Self-pay | Admitting: Family

## 2019-02-22 MED ORDER — LOSARTAN POTASSIUM 50 MG PO TABS: 50.0000 mg | ORAL_TABLET | Freq: Every day | ORAL | 1 refills | Status: DC

## 2019-02-22 NOTE — Telephone Encounter (Signed)
New message     1. Which medications need to be refilled? (please list name of each medication and dose if known) losartan (COZAAR) 50 MG tablet  2. Which pharmacy/location (including street and city if local pharmacy) is medication to be sent to? [facility]  3. Do they need a 30 day or 90 day supply? 90 day supply

## 2019-02-23 ENCOUNTER — Other Ambulatory Visit: Payer: Self-pay | Admitting: Family

## 2019-02-23 MED ORDER — LOSARTAN POTASSIUM 50 MG PO TABS: 50.0000 mg | ORAL_TABLET | Freq: Every day | ORAL | 1 refills | Status: DC

## 2019-02-24 ENCOUNTER — Other Ambulatory Visit: Payer: Medicare Other

## 2019-02-25 ENCOUNTER — Other Ambulatory Visit: Payer: Medicare Other

## 2019-02-28 ENCOUNTER — Other Ambulatory Visit: Payer: Self-pay

## 2019-02-28 ENCOUNTER — Other Ambulatory Visit (INDEPENDENT_AMBULATORY_CARE_PROVIDER_SITE_OTHER): Payer: Medicare Other

## 2019-02-28 DIAGNOSIS — E039 Hypothyroidism, unspecified: Secondary | ICD-10-CM | POA: Diagnosis not present

## 2019-02-28 LAB — TSH: TSH: 13.91 u[IU]/mL — ABNORMAL HIGH (ref 0.35–4.50)

## 2019-02-28 LAB — T4, FREE: Free T4: 0.72 ng/dL (ref 0.60–1.60)

## 2019-03-01 ENCOUNTER — Telehealth: Payer: Self-pay | Admitting: Internal Medicine

## 2019-03-01 NOTE — Telephone Encounter (Signed)
Can you please check and see how much levothyroxine she is currently taking?  If she is still taking half, I suggest taking a full one from now on, but if she is on the full one, I would suggest increasing to one and half tablet.    She had diarrhea and was scared of levothyroxine.   Please let me know   Thank you    Abby Raelyn Mora, MD  [facility] Endocrinology  [facility] [address] [place], [place] [address] Phone: [phone] FAX: [phone]

## 2019-03-01 NOTE — Telephone Encounter (Signed)
Lft vm to return call 

## 2019-03-01 NOTE — Telephone Encounter (Signed)
Spoke to pt and she stated that she has been taking a half of a pill. Instructed pt of changes that you have made and she stated she understood.

## 2019-03-30 ENCOUNTER — Telehealth: Payer: Self-pay

## 2019-03-30 NOTE — Telephone Encounter (Signed)
Spoke with patient and appointment scheduled 

## 2019-03-30 NOTE — Telephone Encounter (Signed)
New message   The patient verbalized she wants CMA to call her to make an appointment to see Ria Clock,   I advise the patient that I could make her an appointment need to know what was going on with her.  The  patient verbalized no one called her from the office to set up an appointment states just have CMA to call me.   No appointment is made at this time.

## 2019-04-14 ENCOUNTER — Other Ambulatory Visit: Payer: Self-pay | Admitting: Family

## 2019-04-14 ENCOUNTER — Telehealth: Payer: Self-pay | Admitting: Family

## 2019-04-14 MED ORDER — SIMVASTATIN 40 MG PO TABS: 40.0000 mg | ORAL_TABLET | Freq: Every day | ORAL | 0 refills | Status: DC

## 2019-04-14 MED ORDER — PANTOPRAZOLE SODIUM 20 MG PO TBEC: 20.0000 mg | DELAYED_RELEASE_TABLET | Freq: Every day | ORAL | 0 refills | Status: DC | PRN

## 2019-04-14 MED ORDER — TRAMADOL HCL 50 MG PO TABS: 50.0000 mg | ORAL_TABLET | Freq: Two times a day (BID) | ORAL | 0 refills | Status: DC | PRN

## 2019-04-14 MED ORDER — LEVOTHYROXINE SODIUM 50 MCG PO TABS: 50.0000 ug | ORAL_TABLET | Freq: Every day | ORAL | 0 refills | Status: DC

## 2019-04-14 MED ORDER — VITAMIN D3 50 MCG (2000 UT) PO CAPS: 2000.0000 [IU] | ORAL_CAPSULE | Freq: Every day | ORAL | 0 refills | Status: DC

## 2019-04-14 MED ORDER — METFORMIN HCL 500 MG PO TABS: ORAL_TABLET | ORAL | 0 refills | Status: DC

## 2019-04-14 MED ORDER — LOSARTAN POTASSIUM 50 MG PO TABS: 50.0000 mg | ORAL_TABLET | Freq: Every day | ORAL | 0 refills | Status: DC

## 2019-04-14 NOTE — Telephone Encounter (Signed)
    1.Medication Requested: levothyroxine (SYNTHROID) 50 MCG tablet losartan (COZAAR) 50 MG tablet metFORMIN (GLUCOPHAGE) 500 MG tablet pantoprazole (PROTONIX) 20 MG tablet simvastatin (ZOCOR) 40 MG tablet traMADol (ULTRAM) 50 MG tablet Cholecalciferol (VITAMIN D) 50 MCG (2000 UT) tablet   2. Pharmacy (Name, Street, [place]):[facility] [phone] - [place], [place] - [address]  3. On Med List: yes  4. Last Visit with PCP: 12/14/18  5. Next visit date with PCP:05/02/19   Agent: Please be advised that RX refills may take up to 3 business days. We ask that you follow-up with your pharmacy.

## 2019-04-14 NOTE — Telephone Encounter (Signed)
Tramadol sent to pharmacy

## 2019-04-14 NOTE — Telephone Encounter (Signed)
Check [place] registry last filled Tramadol 06/14/2018. Vernona Rieger is out of the office today pls advise. Pt has appt scheduled for 05/02/19.Marland KitchenRaechel Chute

## 2019-04-16 ENCOUNTER — Other Ambulatory Visit: Payer: Self-pay | Admitting: Family

## 2019-05-02 ENCOUNTER — Other Ambulatory Visit: Payer: Self-pay

## 2019-05-02 ENCOUNTER — Encounter: Payer: Self-pay | Admitting: Family

## 2019-05-02 ENCOUNTER — Ambulatory Visit (INDEPENDENT_AMBULATORY_CARE_PROVIDER_SITE_OTHER): Payer: Medicare Other | Admitting: Family

## 2019-05-02 VITALS — BP 118/82 | HR 76 | Temp 98.2°F | Wt 202.0 lb

## 2019-05-02 DIAGNOSIS — E039 Hypothyroidism, unspecified: Secondary | ICD-10-CM

## 2019-05-02 DIAGNOSIS — E119 Type 2 diabetes mellitus without complications: Secondary | ICD-10-CM | POA: Diagnosis not present

## 2019-05-02 DIAGNOSIS — I10 Essential (primary) hypertension: Secondary | ICD-10-CM | POA: Diagnosis not present

## 2019-05-02 LAB — CBC WITH DIFFERENTIAL/PLATELET
Basophils Absolute: 0 10*3/uL (ref 0.0–0.1)
Basophils Relative: 0.5 % (ref 0.0–3.0)
Eosinophils Absolute: 0.4 10*3/uL (ref 0.0–0.7)
Eosinophils Relative: 4 % (ref 0.0–5.0)
HCT: 34 % — ABNORMAL LOW (ref 36.0–46.0)
Hemoglobin: 10.8 g/dL — ABNORMAL LOW (ref 12.0–15.0)
Lymphocytes Relative: 28.8 % (ref 12.0–46.0)
Lymphs Abs: 2.6 10*3/uL (ref 0.7–4.0)
MCHC: 31.7 g/dL (ref 30.0–36.0)
MCV: 80.6 fl (ref 78.0–100.0)
Monocytes Absolute: 0.6 10*3/uL (ref 0.1–1.0)
Monocytes Relative: 6.8 % (ref 3.0–12.0)
Neutro Abs: 5.4 10*3/uL (ref 1.4–7.7)
Neutrophils Relative %: 59.9 % (ref 43.0–77.0)
Platelets: 386 10*3/uL (ref 150.0–400.0)
RBC: 4.22 Mil/uL (ref 3.87–5.11)
RDW: 14.6 % (ref 11.5–15.5)
WBC: 9.1 10*3/uL (ref 4.0–10.5)

## 2019-05-02 LAB — COMPREHENSIVE METABOLIC PANEL
ALT: 9 U/L (ref 0–35)
AST: 13 U/L (ref 0–37)
Albumin: 4.2 g/dL (ref 3.5–5.2)
Alkaline Phosphatase: 60 U/L (ref 39–117)
BUN: 18 mg/dL (ref 6–23)
CO2: 28 mEq/L (ref 19–32)
Calcium: 10 mg/dL (ref 8.4–10.5)
Chloride: 105 mEq/L (ref 96–112)
Creatinine, Ser: 0.85 mg/dL (ref 0.40–1.20)
GFR: 79.58 mL/min (ref >60.00)
Glucose, Bld: 87 mg/dL (ref 70–99)
Potassium: 3.7 mEq/L (ref 3.5–5.1)
Sodium: 139 mEq/L (ref 135–145)
Total Bilirubin: 0.3 mg/dL (ref 0.2–1.2)
Total Protein: 7.7 g/dL (ref 6.0–8.3)

## 2019-05-02 LAB — HEMOGLOBIN A1C: Hgb A1c MFr Bld: 6.4 % (ref 4.6–6.5)

## 2019-05-02 LAB — TSH: TSH: 7.59 u[IU]/mL — ABNORMAL HIGH (ref 0.35–4.50)

## 2019-05-02 MED ORDER — CYCLOBENZAPRINE HCL 10 MG PO TABS: 10.0000 mg | ORAL_TABLET | Freq: Two times a day (BID) | ORAL | 1 refills | Status: DC

## 2019-05-02 NOTE — Progress Notes (Signed)
Lindsay [name] is a 74 y.o. female with the following history as recorded in EpicCare:  Patient Active Problem List   Diagnosis Date Noted  . Hypothyroidism 01/12/2019  . Ganglion of flexor tendon sheath of right middle finger 07/15/2018  . Type 2 diabetes mellitus without complication, without long-term current use of insulin ([place]) 02/11/2018  . Essential hypertension 02/11/2018  . Hyperlipidemia 02/11/2018  . Cough variant asthma 11/03/2017  . Chest pain 05/17/2016  . Obesity 05/17/2016  . COPD (chronic obstructive pulmonary disease) ([place])   . Acid reflux   . Urinary tract infection, chronic   . Hyperglycemia   . Hypokalemia   . COPD exacerbation ([place]) 01/24/2013    Current Outpatient Medications  Medication Sig Dispense Refill  . aspirin EC 81 MG tablet Take 81 mg by mouth daily.    . budesonide-formoterol (SYMBICORT) 80-4.5 MCG/ACT inhaler Inhale 2 puffs into the lungs 2 (two) times daily. 1 Inhaler 0  . cetirizine (ZYRTEC) 10 MG tablet Take 10 mg by mouth daily.    . Cholecalciferol (VITAMIN D) 50 MCG (2000 UT) tablet Take 1 tablet (2,000 Units total) by mouth daily. 90 tablet 1  . Cholecalciferol (VITAMIN D3) 50 MCG (2000 UT) capsule Take 1 capsule (2,000 Units total) by mouth daily. Must keep scheduled follow=-up appt for future refills 30 capsule 0  . cyclobenzaprine (FLEXERIL) 10 MG tablet Take 1 tablet (10 mg total) by mouth 2 (two) times daily. 60 tablet 1  . hydrOXYzine (ATARAX/VISTARIL) 10 MG tablet Take 1 tablet (10 mg total) by mouth 3 (three) times daily as needed. 30 tablet 0  . levothyroxine (SYNTHROID) 50 MCG tablet Take 1 tablet (50 mcg total) by mouth daily. Must keep scheduled follow=-up appt for future refills 30 tablet 0  . losartan (COZAAR) 50 MG tablet Take 1 tablet (50 mg total) by mouth daily. Must keep scheduled follow=-up appt for future refills 30 tablet 0  . metFORMIN (GLUCOPHAGE) 500 MG tablet TAKE 1 TABLET(500 MG) BY MOUTH DAILY. 90 tablet 1  .  montelukast (SINGULAIR) 10 MG tablet Take 1 tablet (10 mg total) by mouth daily as needed (congestion). 90 tablet 3  . oxybutynin (DITROPAN) 5 MG tablet TK 1 T PO BID    . pantoprazole (PROTONIX) 20 MG tablet Take 1 tablet (20 mg total) by mouth daily. 90 tablet 3  . polyvinyl alcohol (LIQUIFILM TEARS) 1.4 % ophthalmic solution Place 1 drop into both eyes as needed for dry eyes (red eyes).    . simvastatin (ZOCOR) 40 MG tablet Take 1 tablet (40 mg total) by mouth daily at 6 PM. 90 tablet 3  . traMADol (ULTRAM) 50 MG tablet Take 1 tablet (50 mg total) by mouth 2 (two) times daily as needed. 30 tablet 0  . triamcinolone cream (KENALOG) 0.1 % Apply 1 application topically 2 (two) times daily. 30 g 0  . trimethoprim (TRIMPEX) 100 MG tablet Take 100 mg by mouth at bedtime.  3  . VITAMIN D PO Take 1 capsule by mouth daily.     No current facility-administered medications for this visit.    Allergies: Codeine, Lisinopril, and Morphine and related  Past Medical History:  Diagnosis Date  . Acid reflux   . Asthma   . COPD (chronic obstructive pulmonary disease) ([place])   . Hyperglycemia   . Hypokalemia   . Obesity   . Spinal stenosis   . Urinary tract infection, chronic     Past Surgical History:  Procedure Laterality Date  .  ABDOMINAL HYSTERECTOMY    . ANKLE FUSION Left   . CHOLECYSTECTOMY      Family History  Problem Relation Age of Onset  . Microcephaly Maternal Grandmother   . Heart disease Maternal Grandmother        Not clear details, patient just knows she took "heart pills"  . Asthma Daughter     Social History   Tobacco Use  . Smoking status: Former Smoker    Packs/day: 0.50    Years: 20.00    Pack years: 10.00    Types: Cigarettes    Quit date: 01/06/1997    Years since quitting: 22.3  . Smokeless tobacco: Never Used  Substance Use Topics  . Alcohol use: No    Subjective:  4 month follow-up on hypertension/ Type 2 Diabetes; in baseline state of health today; no  concerns for low blood sugar; has lost 17 pounds since OV in December- has been trying to eat more healthy;     Objective:  Vitals:   05/02/19 1258  BP: 118/82  Pulse: 76  Temp: 98.2 F (36.8 C)  TempSrc: Oral  SpO2: 98%  Weight: 202 lb (91.6 kg)    General: Well developed, well nourished, in no acute distress  Skin : Warm and dry.  Head: Normocephalic and atraumatic  Lungs: Respirations unlabored; clear to auscultation bilaterally without wheeze, rales, rhonchi  CVS exam: normal rate and regular rhythm.  Neurologic: Alert and oriented; speech intact; face symmetrical; moves all extremities well; CNII-XII intact without focal deficit   Assessment:  1. Type 2 diabetes mellitus without complication, without long-term current use of insulin ([place])   2. Essential hypertension   3. Hypothyroidism, unspecified type     Plan:  1) Congratulated patient on her weight loss and commitment to her health; will consider holding Metformin based on Hgba1c today- follow-up to be determined; 2) Stable- continue same medication; discussed that at some point may be able to stop blood pressure medication but not at this time. 3. Check TSH today- keep planned follow-up with endocrinology;  This visit occurred during the SARS-CoV-2 public health emergency.  Safety protocols were in place, including screening questions prior to the visit, additional usage of staff PPE, and extensive cleaning of exam room while observing appropriate contact time as indicated for disinfecting solutions.      No follow-ups on file.  Orders Placed This Encounter  Procedures  . CBC with Differential/Platelet  . Comp Met (CMET)  . HgB A1c  . TSH    Requested Prescriptions   Signed Prescriptions Disp Refills  . cyclobenzaprine (FLEXERIL) 10 MG tablet 60 tablet 1    Sig: Take 1 tablet (10 mg total) by mouth 2 (two) times daily.

## 2019-05-02 NOTE — Patient Instructions (Signed)
Hypothyroidism  Hypothyroidism is when the thyroid gland does not make enough of certain hormones (it is underactive). The thyroid gland is a small gland located in the lower front part of the neck, just in front of the windpipe (trachea). This gland makes hormones that help control how the body uses food for energy (metabolism) as well as how the heart and brain function. These hormones also play a role in keeping your bones strong. When the thyroid is underactive, it produces too little of the hormones thyroxine (T4) and triiodothyronine (T3). What are the causes? This condition may be caused by:  Hashimoto's disease. This is a disease in which the body's disease-fighting system (immune system) attacks the thyroid gland. This is the most common cause.  Viral infections.  Pregnancy.  Certain medicines.  Birth defects.  Past radiation treatments to the head or neck for cancer.  Past treatment with radioactive iodine.  Past exposure to radiation in the environment.  Past surgical removal of part or all of the thyroid.  Problems with a gland in the center of the brain (pituitary gland).  Lack of enough iodine in the diet. What increases the risk? You are more likely to develop this condition if:  You are female.  You have a family history of thyroid conditions.  You use a medicine called lithium.  You take medicines that affect the immune system (immunosuppressants). What are the signs or symptoms? Symptoms of this condition include:  Feeling as though you have no energy (lethargy).  Not being able to tolerate cold.  Weight gain that is not explained by a change in diet or exercise habits.  Lack of appetite.  Dry skin.  Coarse hair.  Menstrual irregularity.  Slowing of thought processes.  Constipation.  Sadness or depression. How is this diagnosed? This condition may be diagnosed based on:  Your symptoms, your medical history, and a physical exam.  Blood  tests. You may also have imaging tests, such as an ultrasound or MRI. How is this treated? This condition is treated with medicine that replaces the thyroid hormones that your body does not make. After you begin treatment, it may take several weeks for symptoms to go away. Follow these instructions at home:  Take over-the-counter and prescription medicines only as told by your health care provider.  If you start taking any new medicines, tell your health care provider.  Keep all follow-up visits as told by your health care provider. This is important. ? As your condition improves, your dosage of thyroid hormone medicine may change. ? You will need to have blood tests regularly so that your health care provider can monitor your condition. Contact a health care provider if:  Your symptoms do not get better with treatment.  You are taking thyroid replacement medicine and you: ? Sweat a lot. ? Have tremors. ? Feel anxious. ? Lose weight rapidly. ? Cannot tolerate heat. ? Have emotional swings. ? Have diarrhea. ? Feel weak. Get help right away if you have:  Chest pain.  An irregular heartbeat.  A rapid heartbeat.  Difficulty breathing. Summary  Hypothyroidism is when the thyroid gland does not make enough of certain hormones (it is underactive).  When the thyroid is underactive, it produces too little of the hormones thyroxine (T4) and triiodothyronine (T3).  The most common cause is Hashimoto's disease, a disease in which the body's disease-fighting system (immune system) attacks the thyroid gland. The condition can also be caused by viral infections, medicine, pregnancy, or past   radiation treatment to the head or neck.  Symptoms may include weight gain, dry skin, constipation, feeling as though you do not have energy, and not being able to tolerate cold.  This condition is treated with medicine to replace the thyroid hormones that your body does not make. This information  is not intended to replace advice given to you by your health care provider. Make sure you discuss any questions you have with your health care provider. Document Revised: 12/05/2016 Document Reviewed: 12/03/2016 Elsevier Patient Education  2020 [employer] Inc[employer]  

## 2019-05-03 NOTE — Progress Notes (Signed)
Reviewed results with patient; she will stay on Metformin daily for now; work on continued weight loss goals and re-check in 3 months; will most likely be able to stop medication at that time.  Appt for 08/01/19 at 1:00 pm;

## 2019-05-12 ENCOUNTER — Encounter: Payer: Self-pay | Admitting: Internal Medicine

## 2019-05-12 ENCOUNTER — Other Ambulatory Visit: Payer: Self-pay

## 2019-05-12 ENCOUNTER — Ambulatory Visit (INDEPENDENT_AMBULATORY_CARE_PROVIDER_SITE_OTHER): Payer: Medicare Other | Admitting: Internal Medicine

## 2019-05-12 VITALS — BP 122/64 | HR 81 | Temp 98.0°F | Ht 63.0 in | Wt 203.0 lb

## 2019-05-12 DIAGNOSIS — E039 Hypothyroidism, unspecified: Secondary | ICD-10-CM

## 2019-05-12 NOTE — Patient Instructions (Addendum)
-   Increase Levothyroxine 50 mcg to TWO tablets on SUNDAYS , and continue 1 tablet Monday - Saturday     You are on levothyroxine - which is your thyroid hormone supplement. You MUST take this consistently.  You should take this first thing in the morning on an empty stomach with water. You should not take it with other medications. Wait to 1hr prior to eating. If you are taking any vitamins - please take these in the evening.   If you miss a dose, please take your missed dose the following day (double the dose for that day). You should have a pill box for ONLY levothyroxine on your bedside table to help you remember to take your medications.

## 2019-05-12 NOTE — Progress Notes (Signed)
Name: Lindsay [name]  MRN/ DOB: [id number], [birth year]    Age/ Sex: 74 y.o., female     PCP: Marrian Salvage, [place]   Reason for Endocrinology Evaluation: Hypothyroidism     Initial Endocrinology Clinic Visit: 01/11/2019    PATIENT IDENTIFIER: Lindsay [name] is a 74 y.o., female with a past medical history of HTN, T2DM and Hypothyroidism. She has followed with [facility] since 01/11/2019 for consultative assistance with management of her hypothyroidism.   HISTORICAL SUMMARY: The patient was diagnosed with hypothyroidism in 12/2018 with a TSH 25.71 uIU/mL . Was started on levothyroxine 50 mcg but developed diarrhea that she attributed to LT-4 replacement and stopped it.  She was encouraged to restart it on her initial visit to our clinic  SUBJECTIVE:   During last visit (01/11/2019): TSH 13.12 uIU/mL , restarted Levothyroxine 50 mcg daily  Today (05/13/2019):  Lindsay [name] is here for a follow up on hypothyroidism.   Weight has been stable  Diarrhea has resolved Energy has improved  No  hair loss  No local neck symptoms    ROS:  As per HPI.   HISTORY:  Past Medical History:  Past Medical History:  Diagnosis Date  . Acid reflux   . Asthma   . COPD (chronic obstructive pulmonary disease) ([facility])   . Hyperglycemia   . Hypokalemia   . Obesity   . Spinal stenosis   . Urinary tract infection, chronic    Past Surgical History:  Past Surgical History:  Procedure Laterality Date  . ABDOMINAL HYSTERECTOMY    . ANKLE FUSION Left   . CHOLECYSTECTOMY      Social History:  reports that she quit smoking about 22 years ago. Her smoking use included cigarettes. She has a 10.00 pack-year smoking history. She has never used smokeless tobacco. She reports that she does not drink alcohol or use drugs. Family History:  Family History  Problem Relation Age of Onset  . Microcephaly Maternal Grandmother   . Heart disease Maternal Grandmother        Not clear  details, patient just knows she took "heart pills"  . Asthma Daughter      HOME MEDICATIONS: Allergies as of 05/12/2019      Reactions   Codeine Nausea Only   Lisinopril    cough   Morphine And Related Itching      Medication List       Accurate as of May 12, 2019 11:59 PM. If you have any questions, ask your nurse or doctor.        aspirin EC 81 MG tablet Take 81 mg by mouth daily.   budesonide-formoterol 80-4.5 MCG/ACT inhaler Commonly known as: Symbicort Inhale 2 puffs into the lungs 2 (two) times daily.   cetirizine 10 MG tablet Commonly known as: ZYRTEC Take 10 mg by mouth daily.   cyclobenzaprine 10 MG tablet Commonly known as: FLEXERIL Take 1 tablet (10 mg total) by mouth 2 (two) times daily.   hydrOXYzine 10 MG tablet Commonly known as: ATARAX/VISTARIL Take 1 tablet (10 mg total) by mouth 3 (three) times daily as needed.   levothyroxine 50 MCG tablet Commonly known as: SYNTHROID Take 1 tablet (50 mcg total) by mouth daily. Must keep scheduled follow=-up appt for future refills   losartan 50 MG tablet Commonly known as: COZAAR Take 1 tablet (50 mg total) by mouth daily. Must keep scheduled follow=-up appt for future refills   metFORMIN 500 MG tablet Commonly known as:  GLUCOPHAGE TAKE 1 TABLET(500 MG) BY MOUTH DAILY.   montelukast 10 MG tablet Commonly known as: SINGULAIR Take 1 tablet (10 mg total) by mouth daily as needed (congestion).   oxybutynin 5 MG tablet Commonly known as: DITROPAN TK 1 T PO BID   pantoprazole 20 MG tablet Commonly known as: PROTONIX Take 1 tablet (20 mg total) by mouth daily.   polyvinyl alcohol 1.4 % ophthalmic solution Commonly known as: LIQUIFILM TEARS Place 1 drop into both eyes as needed for dry eyes (red eyes).   simvastatin 40 MG tablet Commonly known as: ZOCOR Take 1 tablet (40 mg total) by mouth daily at 6 PM.   traMADol 50 MG tablet Commonly known as: ULTRAM Take 1 tablet (50 mg total) by mouth 2 (two)  times daily as needed.   triamcinolone cream 0.1 % Commonly known as: KENALOG Apply 1 application topically 2 (two) times daily.   trimethoprim 100 MG tablet Commonly known as: TRIMPEX Take 100 mg by mouth at bedtime.   Vitamin D 50 MCG (2000 UT) tablet Take 1 tablet (2,000 Units total) by mouth daily.   Vitamin D3 50 MCG (2000 UT) capsule Take 1 capsule (2,000 Units total) by mouth daily. Must keep scheduled follow=-up appt for future refills   VITAMIN D PO Take 1 capsule by mouth daily.         OBJECTIVE:   PHYSICAL EXAM: VS: BP 122/64 (BP Location: Left Arm, Patient Position: Sitting, Cuff Size: Large)   Pulse 81   Temp 98 F (36.7 C)   Ht 5\' 3"  (1.6 m)   Wt 203 lb (92.1 kg)   SpO2 97%   BMI 35.96 kg/m    EXAM: General: Pt appears well and is in NAD  Neck: General: Supple without adenopathy. Thyroid: Thyroid size normal.  No goiter or nodules appreciated. No thyroid bruit.  Lungs: Clear with good BS bilat with no rales, rhonchi, or wheezes  Heart: Auscultation: RRR.  Abdomen: Normoactive bowel sounds, soft, nontender, without masses or organomegaly palpable  Extremities:  BL LE: No pretibial edema normal ROM and strength.  Neuro: Cranial nerves: II - XII grossly intact  Motor: Normal strength throughout DTRs: 2+ and symmetric in UE without delay in relaxation phase  Mental Status: Judgment, insight: Intact Orientation: Oriented to time, place, and person Mood and affect: No depression, anxiety, or agitation     DATA REVIEWED:  Results for Lindsay, [name] (MRN Athena Masse) as of 05/13/2019 09:31  Ref. Range 02/28/2019 13:53 05/02/2019 13:27  TSH Latest Ref Range: 0.35 - 4.50 uIU/mL 13.91 (H) 7.59 (H)  T4,Free(Direct) Latest Ref Range: 0.60 - 1.60 ng/dL 05/04/2019      ASSESSMENT / PLAN / RECOMMENDATIONS:   1. Hypothyroidism:   - Pt is clinically euthyroid  - No local neck symptoms - TSH is improving but its still above goal, pt is reluctant to change  levothyroxine dose, we discussed the importance of euthyroid in her general well being.  - Will increase as below     Medications   Levothyroxine 50 mcg, TWO tablets on SUNDAYS and 1 tablet the res of the week.     F/u in 3 months   Signed electronically by: 1.75, MD  [facility] Endocrinology  [facility] [address] [place] [place], [place] Phone: [phone] FAX: [phone]      CC: [phone], FNP [address] [place] Phone: [phone]  Fax: [phone]   Return to  Endocrinology clinic as below: Future Appointments  Date Time Provider Department Center  08/01/2019  1:00 PM Olive Bass, FNP [facility] None  08/12/2019 10:30 AM Maxine Fredman, Konrad Dolores, MD [facility] None

## 2019-05-13 MED ORDER — LEVOTHYROXINE SODIUM 50 MCG PO TABS: 50.0000 ug | ORAL_TABLET | ORAL | 4 refills | Status: DC

## 2019-06-07 DIAGNOSIS — M545 Low back pain: Secondary | ICD-10-CM | POA: Diagnosis not present

## 2019-06-08 ENCOUNTER — Other Ambulatory Visit: Payer: Self-pay | Admitting: Physical Medicine and Rehabilitation

## 2019-06-08 DIAGNOSIS — H2513 Age-related nuclear cataract, bilateral: Secondary | ICD-10-CM | POA: Diagnosis not present

## 2019-06-08 DIAGNOSIS — E119 Type 2 diabetes mellitus without complications: Secondary | ICD-10-CM | POA: Diagnosis not present

## 2019-06-08 DIAGNOSIS — M545 Low back pain, unspecified: Secondary | ICD-10-CM

## 2019-06-08 DIAGNOSIS — H43813 Vitreous degeneration, bilateral: Secondary | ICD-10-CM | POA: Diagnosis not present

## 2019-06-08 LAB — HM DIABETES EYE EXAM

## 2019-06-14 DIAGNOSIS — H5213 Myopia, bilateral: Secondary | ICD-10-CM | POA: Diagnosis not present

## 2019-06-28 DIAGNOSIS — N3020 Other chronic cystitis without hematuria: Secondary | ICD-10-CM | POA: Diagnosis not present

## 2019-07-01 ENCOUNTER — Other Ambulatory Visit: Payer: Self-pay | Admitting: Family

## 2019-07-01 ENCOUNTER — Telehealth: Payer: Self-pay

## 2019-07-01 MED ORDER — CEFUROXIME AXETIL 250 MG PO TABS: 250.0000 mg | ORAL_TABLET | Freq: Two times a day (BID) | ORAL | 0 refills | Status: DC

## 2019-07-01 NOTE — Telephone Encounter (Signed)
Sent in Ceftin for patient; she has been notified and understands she can cancel her appointment on Monday if feeling better.

## 2019-07-01 NOTE — Telephone Encounter (Signed)
F/u  The patient following up on her previous messages aware the CMA are in clinic .

## 2019-07-01 NOTE — Telephone Encounter (Signed)
The patient calling   C/o UTI  The patient is advised she will need to make an appt voiced she unable to come in the office today, I offer Monday appt patient declined.   The patient is aware a message will be sent around CMA asking for a call back to discuss.

## 2019-07-04 ENCOUNTER — Ambulatory Visit: Payer: Medicare Other | Admitting: Family

## 2019-07-07 ENCOUNTER — Ambulatory Visit
Admission: RE | Admit: 2019-07-07 | Discharge: 2019-07-07 | Disposition: A | Discharge status: Home / Self Care | Payer: Medicare Other | Source: Ambulatory Visit | Attending: Physical Medicine and Rehabilitation | Admitting: Physical Medicine and Rehabilitation

## 2019-07-07 DIAGNOSIS — M48061 Spinal stenosis, lumbar region without neurogenic claudication: Secondary | ICD-10-CM | POA: Diagnosis not present

## 2019-07-07 DIAGNOSIS — M545 Low back pain, unspecified: Secondary | ICD-10-CM

## 2019-07-15 ENCOUNTER — Telehealth: Payer: Self-pay | Admitting: Family

## 2019-07-15 NOTE — Telephone Encounter (Signed)
    Patient calling to report she is itching all over.  She would like medication called to pharmacy, states she has been given something in the past for itching in the past but did not know the name

## 2019-07-15 NOTE — Telephone Encounter (Signed)
Pt called back & informed that Dr Okey Dupre would recommend to first try benadryl.  Offered a visit  for Saturday virtual clinic to assess cause especially since she recently was given an antibiotic course for possible UTI. Also offered a visit to Vernona Rieger for her next available.  Pt declines clinic visit & would like to speak to Vernona Rieger when she is available.  Pt states she has been off the antibiotic for a while now even though her last dose supposed to be today.  Pt encouraged again to try Benadryl (that it might help her sleep as well).  Pt verb understanding.

## 2019-07-15 NOTE — Telephone Encounter (Signed)
LVM for patient to return call re: Dr Frutoso Chase recommendations.

## 2019-07-15 NOTE — Telephone Encounter (Signed)
Arms, legs, thighs, & some places on her face are itching that have been itching since Weds, but it was "real bad last night & I didn't get any sleep".  Denies taking an OTC antihistamine & states that Vernona Rieger typically prescribes her something when she itches.

## 2019-07-15 NOTE — Telephone Encounter (Signed)
I would recommend to first try benadryl and offer visit either at clinic today if available or for Saturday clinic to assess cause especially since she recently was given an antibiotic course for possible UTI.

## 2019-07-15 NOTE — Telephone Encounter (Signed)
LVM asking pt to return phone call for more details on itching.

## 2019-07-23 ENCOUNTER — Emergency Department (HOSPITAL_COMMUNITY)
Admission: EM | Admit: 2019-07-23 | Discharge: 2019-07-23 | Disposition: A | Discharge status: Home / Self Care | Payer: Medicare Other | Attending: Emergency Medicine | Admitting: Emergency Medicine

## 2019-07-23 ENCOUNTER — Encounter (HOSPITAL_COMMUNITY): Payer: Self-pay

## 2019-07-23 ENCOUNTER — Other Ambulatory Visit: Payer: Self-pay

## 2019-07-23 ENCOUNTER — Emergency Department (HOSPITAL_COMMUNITY): Payer: Medicare Other

## 2019-07-23 DIAGNOSIS — Z7982 Long term (current) use of aspirin: Secondary | ICD-10-CM | POA: Diagnosis not present

## 2019-07-23 DIAGNOSIS — J449 Chronic obstructive pulmonary disease, unspecified: Secondary | ICD-10-CM | POA: Insufficient documentation

## 2019-07-23 DIAGNOSIS — L03114 Cellulitis of left upper limb: Secondary | ICD-10-CM

## 2019-07-23 DIAGNOSIS — Z7984 Long term (current) use of oral hypoglycemic drugs: Secondary | ICD-10-CM | POA: Insufficient documentation

## 2019-07-23 DIAGNOSIS — Z87891 Personal history of nicotine dependence: Secondary | ICD-10-CM | POA: Diagnosis not present

## 2019-07-23 DIAGNOSIS — M79642 Pain in left hand: Secondary | ICD-10-CM | POA: Diagnosis not present

## 2019-07-23 DIAGNOSIS — I10 Essential (primary) hypertension: Secondary | ICD-10-CM | POA: Diagnosis not present

## 2019-07-23 DIAGNOSIS — E119 Type 2 diabetes mellitus without complications: Secondary | ICD-10-CM | POA: Insufficient documentation

## 2019-07-23 DIAGNOSIS — Z79899 Other long term (current) drug therapy: Secondary | ICD-10-CM | POA: Insufficient documentation

## 2019-07-23 MED ORDER — ACETAMINOPHEN 325 MG PO TABS
650.0000 mg | ORAL_TABLET | Freq: Once | ORAL | Status: DC
  Filled 2019-07-23: qty 2

## 2019-07-23 MED ORDER — AMOXICILLIN-POT CLAVULANATE 875-125 MG PO TABS: 1.0000 | ORAL_TABLET | Freq: Two times a day (BID) | ORAL | 0 refills | Status: DC

## 2019-07-23 MED ORDER — AMOXICILLIN-POT CLAVULANATE 875-125 MG PO TABS
1.0000 | ORAL_TABLET | Freq: Once | ORAL | Status: AC
  Administered 2019-07-23: 1 via ORAL
  Filled 2019-07-23: qty 1

## 2019-07-23 NOTE — ED Provider Notes (Signed)
[facility]-EMERGENCY DEPT Provider Note   CSN: [id number] Arrival date & time: 07/23/19  0920     History Chief Complaint  Patient presents with  . Hand Problem    Lindsay [name] is a 74 y.o. female.  Pt c/o left hand soreness and swelling for the past 3 days. Symptoms acute onset, dorsum left hand, slowly worse, moderate, dull, non radiating, persistent. Denies hx same. Denies trauma, fall or repetitive use injury. No associated numbness/weakness. No fever or chills, does not feel sick or ill. No other skin lesions or rash.  Denies known bite or sting to area. Denies RA or gout hx.   The history is provided by the patient.       Past Medical History:  Diagnosis Date  . Acid reflux   . Asthma   . COPD (chronic obstructive pulmonary disease) ([facility])   . Hyperglycemia   . Hypokalemia   . Obesity   . Spinal stenosis   . Urinary tract infection, chronic     Patient Active Problem List   Diagnosis Date Noted  . Hypothyroidism 01/12/2019  . Ganglion of flexor tendon sheath of right middle finger 07/15/2018  . Type 2 diabetes mellitus without complication, without long-term current use of insulin ([facility]) 02/11/2018  . Essential hypertension 02/11/2018  . Hyperlipidemia 02/11/2018  . Cough variant asthma 11/03/2017  . Chest pain 05/17/2016  . Obesity 05/17/2016  . COPD (chronic obstructive pulmonary disease) ([facility])   . Acid reflux   . Urinary tract infection, chronic   . Hyperglycemia   . Hypokalemia   . COPD exacerbation ([facility]) 01/24/2013    Past Surgical History:  Procedure Laterality Date  . ABDOMINAL HYSTERECTOMY    . ANKLE FUSION Left   . CHOLECYSTECTOMY       OB History   No obstetric history on file.     Family History  Problem Relation Age of Onset  . Microcephaly Maternal Grandmother   . Heart disease Maternal Grandmother        Not clear details, patient just knows she took "heart pills"  . Asthma Daughter     Social History     Tobacco Use  . Smoking status: Former Smoker    Packs/day: 0.50    Years: 20.00    Pack years: 10.00    Types: Cigarettes    Quit date: 01/06/1997    Years since quitting: 22.5  . Smokeless tobacco: Never Used  Vaping Use  . Vaping Use: Never used  Substance Use Topics  . Alcohol use: No  . Drug use: No    Home Medications Prior to Admission medications   Medication Sig Start Date End Date Taking? Authorizing Provider  aspirin EC 81 MG tablet Take 81 mg by mouth daily.    [provider]  budesonide-formoterol (SYMBICORT) 80-4.5 MCG/ACT inhaler Inhale 2 puffs into the lungs 2 (two) times daily. 12/15/17   Nyoka Cowden, MD  cefUROXime (CEFTIN) 250 MG tablet Take 1 tablet (250 mg total) by mouth 2 (two) times daily with a meal. 07/01/19   Olive Bass, FNP  cetirizine (ZYRTEC) 10 MG tablet Take 10 mg by mouth daily.    [provider]  Cholecalciferol (VITAMIN D) 50 MCG (2000 UT) tablet Take 1 tablet (2,000 Units total) by mouth daily. 04/28/18   Plotnikov, Georgina Quint, MD  Cholecalciferol (VITAMIN D3) 50 MCG (2000 UT) capsule Take 1 capsule (2,000 Units total) by mouth daily. Must keep scheduled follow=-up appt for  future refills 04/14/19   Olive Bass, FNP  cyclobenzaprine (FLEXERIL) 10 MG tablet Take 1 tablet (10 mg total) by mouth 2 (two) times daily. 05/02/19   Olive Bass, FNP  hydrOXYzine (ATARAX/VISTARIL) 10 MG tablet Take 1 tablet (10 mg total) by mouth 3 (three) times daily as needed. 09/14/18   Olive Bass, FNP  levothyroxine (SYNTHROID) 50 MCG tablet Take 1 tablet (50 mcg total) by mouth as directed. 2 tablets on Sundays, 1 tablet rest of the week 05/13/19   Shamleffer, Konrad Dolores, MD  losartan (COZAAR) 50 MG tablet Take 1 tablet (50 mg total) by mouth daily. Must keep scheduled follow=-up appt for future refills 04/14/19   Olive Bass, FNP  metFORMIN (GLUCOPHAGE) 500 MG tablet TAKE 1 TABLET(500 MG) BY MOUTH  DAILY. 04/15/19   Olive Bass, FNP  montelukast (SINGULAIR) 10 MG tablet Take 1 tablet (10 mg total) by mouth daily as needed (congestion). 10/25/18   Olive Bass, FNP  oxybutynin (DITROPAN) 5 MG tablet TK 1 T PO BID 04/11/18   [provider]  pantoprazole (PROTONIX) 20 MG tablet Take 1 tablet (20 mg total) by mouth daily. 04/15/19   Olive Bass, FNP  polyvinyl alcohol (LIQUIFILM TEARS) 1.4 % ophthalmic solution Place 1 drop into both eyes as needed for dry eyes (red eyes).    [provider]  simvastatin (ZOCOR) 40 MG tablet Take 1 tablet (40 mg total) by mouth daily at 6 PM. 04/15/19   Olive Bass, FNP  traMADol (ULTRAM) 50 MG tablet Take 1 tablet (50 mg total) by mouth 2 (two) times daily as needed. 04/14/19   Pincus Sanes, MD  triamcinolone cream (KENALOG) 0.1 % Apply 1 application topically 2 (two) times daily. 06/16/18   Olive Bass, FNP  trimethoprim (TRIMPEX) 100 MG tablet Take 100 mg by mouth at bedtime. 10/12/17   [provider]  VITAMIN D PO Take 1 capsule by mouth daily.    [provider]    Allergies    Codeine, Lisinopril, and Morphine and related  Review of Systems   Review of Systems  Constitutional: Negative for chills, diaphoresis and fever.  HENT: Negative for sore throat.   Respiratory: Negative for cough.   Gastrointestinal: Negative for nausea and vomiting.  Genitourinary: Negative for dysuria.  Skin: Negative for rash and wound.  Neurological: Negative for numbness.  Hematological: Does not bruise/bleed easily.    Physical Exam Updated Vital Signs BP 134/82 (BP Location: Right Arm)   Pulse 75   Temp 98.7 F (37.1 C) (Oral)   Resp 18   SpO2 100%   Physical Exam Vitals and nursing note reviewed.  Constitutional:      Appearance: Normal appearance. She is well-developed.  HENT:     Head: Atraumatic.     Nose: Nose normal.     Mouth/Throat:     Mouth: Mucous membranes are  moist.  Eyes:     General: No scleral icterus.    Conjunctiva/sclera: Conjunctivae normal.  Neck:     Trachea: No tracheal deviation.  Cardiovascular:     Rate and Rhythm: Normal rate.     Pulses: Normal pulses.  Pulmonary:     Effort: Pulmonary effort is normal. No respiratory distress.  Abdominal:     General: There is no distension.  Genitourinary:    Comments: No cva tenderness.  Musculoskeletal:     Cervical back: Neck supple. No rigidity. No muscular tenderness.  Comments: Mild-mod sts to dorsum left hand. Area is mildly warm and erythematous. No crepitus. No pain w rom at wrist or with rom digits. No tenosynovitis. Radial pulse 2+. Normal cap refill distally in finger tips. No bite/sting marks. No LUE l/a at elbow or axilla. No left arm swelling.   Skin:    General: Skin is warm and dry.     Findings: No rash.  Neurological:     Mental Status: She is alert.     Comments: Alert, speech normal.   Psychiatric:        Mood and Affect: Mood normal.     ED Results / Procedures / Treatments   Labs (all labs ordered are listed, but only abnormal results are displayed) Labs Reviewed - No data to display  EKG None  Radiology DG Hand Complete Left  Result Date: 07/23/2019 CLINICAL DATA:  Nontraumatic left hand pain and swelling. EXAM: LEFT HAND - COMPLETE 3+ VIEW COMPARISON:  None. FINDINGS: Negative for acute fracture or dislocation in left hand. Irregularity of the distal radius and ulna are suggestive for old injuries. Mild degenerative changes in the carpal bones. Overall alignment of the left hand is within normal limits. IMPRESSION: No acute abnormality to left hand. Electronically Signed   By: Richarda Overlie M.D.   On: 07/23/2019 10:34    Procedures Procedures (including critical care time)  Medications Ordered in ED Medications  amoxicillin-clavulanate (AUGMENTIN) 875-125 MG per tablet 1 tablet (has no administration in time range)  acetaminophen (TYLENOL) tablet  650 mg (has no administration in time range)    ED Course  I have reviewed the triage vital signs and the nursing notes.  Pertinent labs & imaging results that were available during my care of the patient were reviewed by me and considered in my medical decision making (see chart for details).    MDM Rules/Calculators/A&P                          Confirmed no antibiotic allergies w pt.   Reviewed nursing notes and prior charts for additional history.   Augmentin po, acetaminophen po.  Pt appears stable for d/c.   Return precautions provided.    Final Clinical Impression(s) / ED Diagnoses Final diagnoses:  None    Rx / DC Orders ED Discharge Orders    None       Cathren Laine, MD 07/23/19 1452

## 2019-07-23 NOTE — ED Triage Notes (Signed)
She c/o non-traumatic pain, swelling and erythema at post thumb web area of left hand. She is in no distress. She states it has been sore "for about three days.

## 2019-07-23 NOTE — Discharge Instructions (Addendum)
It was our pleasure to provide your ER care today - we hope that you feel better.  Take antibiotic as prescribed. Take acetaminophen or ibuprofen as need.   Follow up with primary care doctor in the coming week if symptoms fail to improve/resolve.  Return to ER if worse, new symptoms, fevers, spreading redness, worsening or severe pain or swelling, or other concern.

## 2019-07-24 ENCOUNTER — Encounter (HOSPITAL_COMMUNITY): Payer: Self-pay

## 2019-07-24 ENCOUNTER — Other Ambulatory Visit: Payer: Self-pay

## 2019-07-24 ENCOUNTER — Emergency Department (HOSPITAL_COMMUNITY): Payer: Medicare Other

## 2019-07-24 DIAGNOSIS — M1711 Unilateral primary osteoarthritis, right knee: Secondary | ICD-10-CM | POA: Diagnosis not present

## 2019-07-24 DIAGNOSIS — J449 Chronic obstructive pulmonary disease, unspecified: Secondary | ICD-10-CM | POA: Diagnosis not present

## 2019-07-24 DIAGNOSIS — Z87891 Personal history of nicotine dependence: Secondary | ICD-10-CM | POA: Insufficient documentation

## 2019-07-24 DIAGNOSIS — R52 Pain, unspecified: Secondary | ICD-10-CM | POA: Diagnosis not present

## 2019-07-24 DIAGNOSIS — M25561 Pain in right knee: Secondary | ICD-10-CM | POA: Insufficient documentation

## 2019-07-24 DIAGNOSIS — E119 Type 2 diabetes mellitus without complications: Secondary | ICD-10-CM | POA: Insufficient documentation

## 2019-07-24 DIAGNOSIS — E039 Hypothyroidism, unspecified: Secondary | ICD-10-CM | POA: Diagnosis not present

## 2019-07-24 DIAGNOSIS — Z20822 Contact with and (suspected) exposure to covid-19: Secondary | ICD-10-CM | POA: Diagnosis not present

## 2019-07-24 DIAGNOSIS — L0390 Cellulitis, unspecified: Secondary | ICD-10-CM | POA: Diagnosis not present

## 2019-07-24 DIAGNOSIS — M25461 Effusion, right knee: Secondary | ICD-10-CM | POA: Diagnosis not present

## 2019-07-24 NOTE — ED Triage Notes (Signed)
Patient arrived via [facility] with complaints of right knee pain, reports having arthritis and her knee "gave out" earlier today but did not fall. Reports taking a muscle relaxer and pain medication but pain is still 10/10. Patient also recently diagnosed with Cellulitis in her left wrist.

## 2019-07-25 ENCOUNTER — Emergency Department (HOSPITAL_COMMUNITY)
Admission: EM | Admit: 2019-07-25 | Discharge: 2019-07-27 | Disposition: A | Discharge status: Home / Self Care | Payer: Medicare Other | Attending: Emergency Medicine | Admitting: Emergency Medicine

## 2019-07-25 ENCOUNTER — Emergency Department (HOSPITAL_COMMUNITY): Payer: Medicare Other

## 2019-07-25 DIAGNOSIS — M25561 Pain in right knee: Secondary | ICD-10-CM | POA: Diagnosis not present

## 2019-07-25 DIAGNOSIS — M25461 Effusion, right knee: Secondary | ICD-10-CM | POA: Diagnosis not present

## 2019-07-25 DIAGNOSIS — M1711 Unilateral primary osteoarthritis, right knee: Secondary | ICD-10-CM | POA: Diagnosis not present

## 2019-07-25 LAB — CBC WITH DIFFERENTIAL/PLATELET
Abs Immature Granulocytes: 0.03 10*3/uL (ref 0.00–0.07)
Basophils Absolute: 0 10*3/uL (ref 0.0–0.1)
Basophils Relative: 0 %
Eosinophils Absolute: 0.1 10*3/uL (ref 0.0–0.5)
Eosinophils Relative: 1 %
HCT: 32.2 % — ABNORMAL LOW (ref 36.0–46.0)
Hemoglobin: 10.1 g/dL — ABNORMAL LOW (ref 12.0–15.0)
Immature Granulocytes: 0 %
Lymphocytes Relative: 21 %
Lymphs Abs: 2.3 10*3/uL (ref 0.7–4.0)
MCH: 25.4 pg — ABNORMAL LOW (ref 26.0–34.0)
MCHC: 31.4 g/dL (ref 30.0–36.0)
MCV: 80.9 fL (ref 80.0–100.0)
Monocytes Absolute: 0.9 10*3/uL (ref 0.1–1.0)
Monocytes Relative: 8 %
Neutro Abs: 7.9 10*3/uL — ABNORMAL HIGH (ref 1.7–7.7)
Neutrophils Relative %: 70 %
Platelets: 374 10*3/uL (ref 150–400)
RBC: 3.98 MIL/uL (ref 3.87–5.11)
RDW: 14.9 % (ref 11.5–15.5)
WBC: 11.2 10*3/uL — ABNORMAL HIGH (ref 4.0–10.5)
nRBC: 0 % (ref 0.0–0.2)

## 2019-07-25 LAB — SYNOVIAL CELL COUNT + DIFF, W/ CRYSTALS
Crystals, Fluid: NONE SEEN
Monocyte-Macrophage-Synovial Fluid: 18 % — ABNORMAL LOW (ref 50–90)
Neutrophil, Synovial: 82 % — ABNORMAL HIGH (ref 0–25)
WBC, Synovial: 5280 /mm3 — ABNORMAL HIGH (ref 0–200)

## 2019-07-25 LAB — COMPREHENSIVE METABOLIC PANEL
ALT: 11 U/L (ref 0–44)
AST: 13 U/L — ABNORMAL LOW (ref 15–41)
Albumin: 3.9 g/dL (ref 3.5–5.0)
Alkaline Phosphatase: 56 U/L (ref 38–126)
Anion gap: 11 (ref 5–15)
BUN: 15 mg/dL (ref 8–23)
CO2: 21 mmol/L — ABNORMAL LOW (ref 22–32)
Calcium: 9.7 mg/dL (ref 8.9–10.3)
Chloride: 106 mmol/L (ref 98–111)
Creatinine, Ser: 0.68 mg/dL (ref 0.44–1.00)
GFR calc Af Amer: >60 mL/min (ref >60)
GFR calc non Af Amer: >60 mL/min (ref >60)
Glucose, Bld: 104 mg/dL — ABNORMAL HIGH (ref 70–99)
Potassium: 3.6 mmol/L (ref 3.5–5.1)
Sodium: 138 mmol/L (ref 135–145)
Total Bilirubin: 0.7 mg/dL (ref 0.3–1.2)
Total Protein: 7.8 g/dL (ref 6.5–8.1)

## 2019-07-25 LAB — SARS CORONAVIRUS 2 BY RT PCR (HOSPITAL ORDER, PERFORMED IN ~~LOC~~ HOSPITAL LAB): SARS Coronavirus 2: NEGATIVE

## 2019-07-25 LAB — SEDIMENTATION RATE: Sed Rate: 90 mm/hr — ABNORMAL HIGH (ref 0–22)

## 2019-07-25 MED ORDER — IBUPROFEN 800 MG PO TABS
800.0000 mg | ORAL_TABLET | Freq: Once | ORAL | Status: AC
  Administered 2019-07-25: 800 mg via ORAL
  Filled 2019-07-25: qty 1

## 2019-07-25 MED ORDER — ASPIRIN EC 81 MG PO TBEC
81.0000 mg | DELAYED_RELEASE_TABLET | Freq: Every day | ORAL | Status: DC
  Administered 2019-07-26 – 2019-07-27 (×2): 81 mg via ORAL
  Filled 2019-07-25 (×2): qty 1

## 2019-07-25 MED ORDER — LIDOCAINE-EPINEPHRINE (PF) 2 %-1:200000 IJ SOLN
INTRAMUSCULAR | Status: AC
  Filled 2019-07-25: qty 20

## 2019-07-25 MED ORDER — AMOXICILLIN-POT CLAVULANATE 875-125 MG PO TABS
1.0000 | ORAL_TABLET | Freq: Two times a day (BID) | ORAL | Status: DC
  Administered 2019-07-26 – 2019-07-27 (×4): 1 via ORAL
  Filled 2019-07-25 (×4): qty 1

## 2019-07-25 MED ORDER — FENTANYL CITRATE (PF) 100 MCG/2ML IJ SOLN
50.0000 ug | Freq: Once | INTRAMUSCULAR | Status: AC
  Administered 2019-07-25: 50 ug via INTRAVENOUS
  Filled 2019-07-25: qty 2

## 2019-07-25 MED ORDER — IBUPROFEN 800 MG PO TABS
400.0000 mg | ORAL_TABLET | Freq: Three times a day (TID) | ORAL | 0 refills | Status: DC
Start: 2019-07-25 — End: 2019-08-04

## 2019-07-25 MED ORDER — VITAMIN D3 25 MCG (1000 UNIT) PO TABS
2000.0000 [IU] | ORAL_TABLET | Freq: Every day | ORAL | Status: DC
  Administered 2019-07-26 – 2019-07-27 (×2): 2000 [IU] via ORAL
  Filled 2019-07-25 (×3): qty 2

## 2019-07-25 MED ORDER — OXYCODONE-ACETAMINOPHEN 5-325 MG PO TABS
1.0000 | ORAL_TABLET | Freq: Once | ORAL | Status: AC
  Administered 2019-07-25: 1 via ORAL
  Filled 2019-07-25: qty 1

## 2019-07-25 MED ORDER — MOMETASONE FURO-FORMOTEROL FUM 100-5 MCG/ACT IN AERO
2.0000 | INHALATION_SPRAY | Freq: Two times a day (BID) | RESPIRATORY_TRACT | Status: DC
  Administered 2019-07-27: 2 via RESPIRATORY_TRACT
  Filled 2019-07-25: qty 8.8

## 2019-07-25 MED ORDER — LACTATED RINGERS IV BOLUS
1000.0000 mL | Freq: Once | INTRAVENOUS | Status: AC
  Administered 2019-07-25: 1000 mL via INTRAVENOUS

## 2019-07-25 MED ORDER — LIDOCAINE-EPINEPHRINE 2 %-1:100000 IJ SOLN
20.0000 mL | Freq: Once | INTRAMUSCULAR | Status: AC
  Administered 2019-07-25: 20 mL via INTRADERMAL

## 2019-07-25 MED ORDER — SIMVASTATIN 20 MG PO TABS
40.0000 mg | ORAL_TABLET | Freq: Every day | ORAL | Status: DC
  Administered 2019-07-26: 40 mg via ORAL
  Filled 2019-07-25: qty 2

## 2019-07-25 MED ORDER — LORATADINE 10 MG PO TABS
10.0000 mg | ORAL_TABLET | Freq: Every day | ORAL | Status: DC
  Administered 2019-07-26 – 2019-07-27 (×2): 10 mg via ORAL
  Filled 2019-07-25 (×2): qty 1

## 2019-07-25 MED ORDER — HYDROCODONE-ACETAMINOPHEN 5-325 MG PO TABS
1.0000 | ORAL_TABLET | Freq: Four times a day (QID) | ORAL | Status: DC | PRN
  Filled 2019-07-25: qty 1

## 2019-07-25 MED ORDER — OXYBUTYNIN CHLORIDE 5 MG PO TABS
5.0000 mg | ORAL_TABLET | Freq: Every day | ORAL | Status: DC
  Administered 2019-07-26 – 2019-07-27 (×2): 5 mg via ORAL
  Filled 2019-07-25 (×2): qty 1

## 2019-07-25 MED ORDER — PANTOPRAZOLE SODIUM 20 MG PO TBEC
20.0000 mg | DELAYED_RELEASE_TABLET | Freq: Every day | ORAL | Status: DC
  Administered 2019-07-26 – 2019-07-27 (×2): 20 mg via ORAL
  Filled 2019-07-25: qty 1

## 2019-07-25 MED ORDER — METFORMIN HCL 500 MG PO TABS
500.0000 mg | ORAL_TABLET | Freq: Two times a day (BID) | ORAL | Status: DC
  Administered 2019-07-26: 500 mg via ORAL
  Filled 2019-07-25 (×2): qty 1

## 2019-07-25 MED ORDER — LEVOTHYROXINE SODIUM 50 MCG PO TABS
50.0000 ug | ORAL_TABLET | ORAL | Status: DC
  Filled 2019-07-25: qty 1

## 2019-07-25 NOTE — Progress Notes (Signed)
CSW spoke with pt who was agreeable to placement and provided verbal permission again for FL-2 creation and referrals sent via the hub.  Pt stated she chose [facility] for SNF and CSW counseled her this was an inpatient rehab hosptial and she was not give that recommendation by PT.  Pt voiced understanding of the above and that the [facility] CSW/RN CM team would provide her with options once feedback had been provided by there hub on options.  Pt as cautioned against false hope that placement was guaranteed but that every effort would be given to provide pt with timely options for SNF placement.  Pt stated she was not interested in LTC.  Pt was counseled [facility] Medicare was her primary ins and would have to be utilized first for placement.  Pt voiced understanding and Pt was appreciative and thanked the CSW.  CSW will continue to follow for D/C needs.  Dorothe Pea. Carnel Stegman  MSW, LCSW, LCAS, CCS Transitions of Care Clinical Social Worker Care Coordination Department Ph: [phone]

## 2019-07-25 NOTE — Progress Notes (Signed)
Assessment completed.  FL-2 created/signed by EDP who agrees to initiate rapid COVID test.  Referrals sent out per pt's request via the hub to the [facility] are with pt's provided verbal permission.  CSW will continue to follow for D/C needs.  Dorothe Pea. Shan Padgett  MSW, LCSW, LCAS, CCS Transitions of Care Clinical Social Worker Care Coordination Department Ph: [phone]

## 2019-07-25 NOTE — TOC Initial Note (Signed)
Transition of Care [facility]) - Initial/Assessment Note    Patient Details  Name: Lindsay [name] MRN: [id number] Date of Birth: 12-13-1945  [facility] of [facility]) CM/SW Contact:    Mercy Riding, LCSW Phone Number: 07/25/2019, 5:58 PM  Clinical Narrative:      CSW spoke with pt who was agreeable to placement and provided verbal permission again for FL-2 creation and referrals sent via the hub.  Pt stated she chose [facility] for SNF and CSW counseled her this was an inpatient rehab hosptial and she was not give that recommendation by PT.  Pt voiced understanding of the above and that the [facility] CSW/RN CM team would provide her with options once feedback had been provided by there hub on options.  Pt as cautioned against false hope that placement was guaranteed but that every effort would be given to provide pt with timely options for SNF placement.  Pt stated she was not interested in LTC.  Pt was counseled [facility] Medicare was her primary ins and would have to be utilized first for placement.  Pt voiced understanding and Pt was appreciative and thanked the CSW.             Expected Discharge Plan: Skilled Nursing Facility Barriers to Discharge: Insurance Authorization   Patient Goals and CMS Choice Patient states their goals for this hospitalization and ongoing recovery are:: To regain strength and return home.      Expected Discharge Plan and Services Expected Discharge Plan: Skilled Nursing Facility                                              Prior Living Arrangements/Services   Lives with:: Self   Do you feel safe going back to the place where you live?: No      Need for Family Participation in Patient Care: Yes (Comment) Care giver support system in place?: Yes (comment)      Activities of Daily Living      Permission Sought/Granted Permission sought to share information with : Oceanographer granted to share  information with : Yes, Verbal Permission Granted              Emotional Assessment     Affect (typically observed): Stoic, Pleasant, Calm Orientation: : Oriented to Self, Oriented to Place, Oriented to  Time, Oriented to Situation   Psych Involvement: No (comment)  Admission diagnosis:  Right Knee Pain  Patient Active Problem List   Diagnosis Date Noted  . Hypothyroidism 01/12/2019  . Ganglion of flexor tendon sheath of right middle finger 07/15/2018  . Type 2 diabetes mellitus without complication, without long-term current use of insulin ([facility]) 02/11/2018  . Essential hypertension 02/11/2018  . Hyperlipidemia 02/11/2018  . Cough variant asthma 11/03/2017  . Chest pain 05/17/2016  . Obesity 05/17/2016  . COPD (chronic obstructive pulmonary disease) ([facility])   . Acid reflux   . Urinary tract infection, chronic   . Hyperglycemia   . Hypokalemia   . COPD exacerbation ([facility]) 01/24/2013   PCP:  Olive Bass, FNP Pharmacy:   [facility] [id number] [place], [place] - [phone] AT [facility] [address] & [address] [place] [address] Phone: [phone] Fax: [phone]     Social Determinants of Health (SDOH) Interventions    Readmission Risk Interventions No flowsheet data found.  CSW will continue to follow for D/C needs.  Dorothe Pea. Arlanda Shiplett  MSW, LCSW, LCAS, CCS Transitions of Care Clinical Social Worker Care Coordination Department Ph: [phone]

## 2019-07-25 NOTE — Social Work (Signed)
TOC CSW has received consult. CSW attempting to follow up at present time. °  °CSW will continue to follow for dc needs. ° °Alonna Bartling Tarpley-Carter, MSW, LCSW-A °[place] [facility] °Transitions of Care Clinical Social Worker °[facility] °[phone] °

## 2019-07-25 NOTE — Evaluation (Signed)
Physical Therapy Evaluation Patient Details Name: Lindsay [name] MRN: [id number] DOB: 01-18-1945 Today's Date: 07/25/2019   History of Present Illness  74 yo female admitted with L wrist pain/swelling, R knee pain affecting ability to ambulate. Hx of obesity, COPD, DM.  Clinical Impression  On eval in ED, pt required Mod assist for mobility. She was eventually able to stand and perform heel-toe sliding/scooting of feet in order to move laterally along bedside. She used a RW but has some difficulty WBing on L UE due to L wrist swelling/pain. Increased time and effort to complete all tasks this morning. Mobility is limited by pain. Pt lives at home alone. Currently, she would not be able to safely manage at home. Recommendation is for ST SNF.     Follow Up Recommendations SNF    Equipment Recommendations  None recommended by PT    Recommendations for Other Services       Precautions / Restrictions Precautions Precautions: Fall Restrictions Weight Bearing Restrictions: No      Mobility  Bed Mobility Overal bed mobility: Needs Assistance Bed Mobility: Supine to Sit;Sit to Supine     Supine to sit: Mod assist;HOB elevated Sit to supine: Mod assist;HOB elevated   General bed mobility comments: Increased time. Cues for technique. Assist for trunk and R LE. Pt relied heavily on rails.  Transfers Overall transfer level: Needs assistance Equipment used: Rolling walker (2 wheeled) Transfers: Sit to/from Stand Sit to Stand: Min assist;From elevated surface         General transfer comment: Assist to power up, stabilize, control descent. Increased time. Cues for safety, technique, hand/LE placement.  Ambulation/Gait Ambulation/Gait assistance: Min assist;+2 safety/equipment   Assistive device: Rolling walker (2 wheeled)       General Gait Details: Pt was able to scoot (heel-toe) on both LEs, in order to move closer to head of bed, with use of a RW. Pt had difficulty WBing  on L UE and R LE. Increased time.  Stairs            Wheelchair Mobility    Modified Rankin (Stroke Patients Only)       Balance Overall balance assessment: Needs assistance         Standing balance support: Bilateral upper extremity supported Standing balance-Leahy Scale: Poor                               Pertinent Vitals/Pain Pain Assessment: 0-10 Pain Score: 9  Pain Location: R knee Pain Descriptors / Indicators: Aching;Tender;Sore;Grimacing;Guarding Pain Intervention(s): Limited activity within patient's tolerance;Monitored during session;Repositioned    Home Living Family/patient expects to be discharged to:: Private residence Living Arrangements: Alone   Type of Home: Apartment Home Access: Level entry     Home Layout: One level Home Equipment: Environmental consultant - 4 wheels;Cane - single point      Prior Function Level of Independence: Independent with assistive device(s)         Comments: uses "walking stick" for ambulation     Hand Dominance        Extremity/Trunk Assessment   Upper Extremity Assessment Upper Extremity Assessment: Generalized weakness    Lower Extremity Assessment Lower Extremity Assessment: Generalized weakness (unable to fully assess 2* pain.)    Cervical / Trunk Assessment Cervical / Trunk Assessment: Normal  Communication   Communication: No difficulties  Cognition Arousal/Alertness: Awake/alert Behavior During Therapy: WFL for tasks assessed/performed Overall Cognitive Status: Within Functional Limits  for tasks assessed                                        General Comments      Exercises     Assessment/Plan    PT Assessment Patient needs continued PT services  PT Problem List Decreased strength;Decreased mobility;Obesity;Decreased range of motion;Decreased activity tolerance;Decreased balance;Decreased knowledge of use of DME;Pain       PT Treatment Interventions DME  instruction;Gait training;Therapeutic activities;Therapeutic exercise;Patient/family education;Balance training;Functional mobility training    PT Goals (Current goals can be found in the Care Plan section)  Acute Rehab PT Goals Patient Stated Goal: less pain. regain independence PT Goal Formulation: With patient Time For Goal Achievement: 08/08/19 Potential to Achieve Goals: Good    Frequency Min 2X/week   Barriers to discharge        Co-evaluation               AM-PAC PT "6 Clicks" Mobility  Outcome Measure Help needed turning from your back to your side while in a flat bed without using bedrails?: A Little Help needed moving from lying on your back to sitting on the side of a flat bed without using bedrails?: A Lot Help needed moving to and from a bed to a chair (including a wheelchair)?: A Lot Help needed standing up from a chair using your arms (e.g., wheelchair or bedside chair)?: A Lot Help needed to walk in hospital room?: A Lot Help needed climbing 3-5 steps with a railing? : Total 6 Click Score: 12    End of Session Equipment Utilized During Treatment: Gait belt Activity Tolerance: Patient limited by pain;Patient limited by fatigue Patient left: in bed;with call bell/phone within reach   PT Visit Diagnosis: Pain;Muscle weakness (generalized) (M62.81);Difficulty in walking, not elsewhere classified (R26.2) Pain - Right/Left: Right Pain - part of body: Knee    Time: 1497-0263 PT Time Calculation (min) (ACUTE ONLY): 46 min   Charges:   PT Evaluation $PT Eval Low Complexity: 1 Low PT Treatments $Gait Training: 8-22 mins $Therapeutic Activity: 8-22 mins           Faye Ramsay, PT Acute Rehabilitation  Office: [phone] Pager: [phone]

## 2019-07-25 NOTE — ED Notes (Signed)
Lindsay [name] (daughter) [phone]. Please update if patient admitted. Is also patient's ride home.

## 2019-07-25 NOTE — ED Provider Notes (Addendum)
Emergency Department Provider Note  I have reviewed the triage vital signs and the nursing notes.  HISTORY  Chief Complaint Knee Pain   HPI Lindsay [name] is a 74 y.o. female with multiple medical problems as documented below who presents to the emergency department today with right knee pain.  Patient states that she was walking and all of a sudden had sudden pain in her right knee and it buckled and she fell down.  Pain started before she actually fell.  Since that time she had difficulty walking secondary to pain in her knee.  She also relates having been diagnosed with cellulitis of her right hand and recent hospital stay and being on antibiotics for approximately 1 day.  Patient that she has history of arthritis and some spinal stenosis but she never had a problem with this with her knee that she knows of.  First, nausea, vomiting or systemic symptoms.  No other joints are hurting.   No other associated or modifying symptoms.    Past Medical History:  Diagnosis Date  . Acid reflux   . Asthma   . COPD (chronic obstructive pulmonary disease) ([place])   . Hyperglycemia   . Hypokalemia   . Obesity   . Spinal stenosis   . Urinary tract infection, chronic     Patient Active Problem List   Diagnosis Date Noted  . Hypothyroidism 01/12/2019  . Ganglion of flexor tendon sheath of right middle finger 07/15/2018  . Type 2 diabetes mellitus without complication, without long-term current use of insulin ([place]) 02/11/2018  . Essential hypertension 02/11/2018  . Hyperlipidemia 02/11/2018  . Cough variant asthma 11/03/2017  . Chest pain 05/17/2016  . Obesity 05/17/2016  . COPD (chronic obstructive pulmonary disease) ([place])   . Acid reflux   . Urinary tract infection, chronic   . Hyperglycemia   . Hypokalemia   . COPD exacerbation ([place]) 01/24/2013    Past Surgical History:  Procedure Laterality Date  . ABDOMINAL HYSTERECTOMY    . ANKLE FUSION Left   . CHOLECYSTECTOMY       Current Outpatient Rx  . Order #: [id number] Class: Normal  . Order #: [id number] Class: Historical Med  . Order #: [id number] Class: Sample  . Order #: [id number] Class: Normal  . Order #: [id number] Class: Historical Med  . Order #: [id number] Class: Normal  . Order #: [id number] Class: Normal  . Order #: [id number] Class: Normal  . Order #: [id number] Class: Normal  . Order #: [id number] Class: Print  . Order #: [id number] Class: Normal  . Order #: [id number] Class: Normal  . Order #: [id number] Class: Normal  . Order #: [id number] Class: Normal  . Order #: [id number] Class: Historical Med  . Order #: [id number] Class: Normal  . Order #: [id number] Class: Historical Med  . Order #: [id number] Class: Normal  . Order #: [id number] Class: Normal  . Order #: [id number] Class: Normal  . Order #: [id number] Class: Historical Med  . Order #: [id number] Class: Historical Med    Allergies Codeine, Lisinopril, and Morphine and related  Family History  Problem Relation Age of Onset  . Microcephaly Maternal Grandmother   . Heart disease Maternal Grandmother        Not clear details, patient just knows she took "heart pills"  . Asthma Daughter     Social History Social History   Tobacco Use  . Smoking status: Former Smoker    Packs/day: 0.50    Years: 20.00    Pack years: 10.00    Types: Cigarettes    Quit date: 01/06/1997    Years  since quitting: 22.5  . Smokeless tobacco: Never Used  Vaping Use  . Vaping Use: Never used  Substance Use Topics  . Alcohol use: No  . Drug use: No    Review of Systems  All other systems negative except as documented in the HPI. All pertinent positives and negatives as reviewed in the HPI. ____________________________________________  PHYSICAL EXAM:  VITAL SIGNS: ED Triage Vitals  Enc Vitals Group     BP 07/24/19 2250 129/74     Pulse Rate 07/24/19 2250 94     Resp 07/24/19 2250 16     Temp 07/24/19 2250 99.4 F (37.4 C)     Temp Source 07/24/19 2250 Oral     SpO2  07/24/19 2250 96 %     Weight --      Height --      Head Circumference --      Peak Flow --      Pain Score 07/24/19 2235 10     Pain Loc --      Pain Edu? --      Excl. in [place]? --     Constitutional: Alert and oriented. Well appearing and in no acute distress. Eyes: Conjunctivae are normal. PERRL. EOMI. Head: Atraumatic. Nose: No congestion/rhinnorhea. Mouth/Throat: Mucous membranes are moist.  Oropharynx non-erythematous. Neck: No stridor.  No meningeal signs.   Cardiovascular: Normal rate, regular rhythm. Good peripheral circulation. Grossly normal heart sounds.   Respiratory: Normal respiratory effort.  No retractions. Lungs CTAB. Gastrointestinal: Soft and nontender. No distention.  Musculoskeletal: Pain with range of motion of her right knee, joint effusion, warm to touch, mildly erythematous but not significantly so. Neurologic:  Normal speech and language. No gross focal neurologic deficits are appreciated.  Skin:  Skin is warm, dry and intact. No rash noted.  ____________________________________________   LABS (all labs ordered are listed, but only abnormal results are displayed)  Labs Reviewed  CBC WITH DIFFERENTIAL/PLATELET - Abnormal; Notable for the following components:      Result Value   WBC 11.2 (*)    Hemoglobin 10.1 (*)    HCT 32.2 (*)    MCH 25.4 (*)    Neutro Abs 7.9 (*)    All other components within normal limits  COMPREHENSIVE METABOLIC PANEL - Abnormal; Notable for the following components:   CO2 21 (*)    Glucose, Bld 104 (*)    AST 13 (*)    All other components within normal limits  SEDIMENTATION RATE - Abnormal; Notable for the following components:   Sed Rate 90 (*)    All other components within normal limits  SYNOVIAL CELL COUNT + DIFF, W/ CRYSTALS - Abnormal; Notable for the following components:   Appearance-Synovial TURBID (*)    WBC, Synovial 5,280 (*)    Neutrophil, Synovial 82 (*)    Monocyte-Macrophage-Synovial Fluid 18 (*)     All other components within normal limits  BODY FLUID CULTURE  GRAM STAIN  GLUCOSE, BODY FLUID OTHER  PROTEIN, BODY FLUID (OTHER)  C-REACTIVE PROTEIN   ____________________________________________  RADIOLOGY  DG Knee Complete 4 Views Right  Result Date: 07/25/2019 CLINICAL DATA:  Pain. EXAM: RIGHT KNEE - COMPLETE 4+ VIEW COMPARISON:  10/13/2013 FINDINGS: There are advanced tricompartmental degenerative changes of the right knee, greatest within the lateral compartment. This has significantly progressed since 2015. There is a large suprapatellar joint effusion. There is no definite acute displaced fracture or dislocation. IMPRESSION: 1. No definite acute displaced fracture or dislocation. 2. Large suprapatellar  joint effusion. 3. 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 Electronically Signed   By: Constance Holster M.D.   On: 07/25/2019 02:20   ____________________________________________  PROCEDURES  Procedure(s) performed:   .Joint Aspiration/Arthrocentesis  Date/Time: 07/25/2019 7:18 AM Performed by: Merrily Pew, MD Authorized by: Merrily Pew, MD   Consent:    Consent obtained:  Verbal   Consent given by:  Patient   Risks discussed:  Bleeding, infection, incomplete drainage, nerve damage, pain and poor cosmetic result   Alternatives discussed:  No treatment Location:    Location:  Knee   Knee:  R knee Anesthesia (see MAR for exact dosages):    Anesthesia method:  Local infiltration   Local anesthetic:  Lidocaine 1% WITH epi Procedure details:    Preparation: Patient was prepped and draped in usual sterile fashion     Needle gauge:  18 G   Approach:  Lateral   Aspirate amount:  30cc   Aspirate characteristics:  Serous and cloudy   Steroid injected: no     Specimen collected: yes   Post-procedure details:    Dressing:  Adhesive bandage   Patient tolerance of procedure:  Tolerated well, no  immediate complications   ____________________________________________  INITIAL IMPRESSION / ASSESSMENT AND PLAN / ED COURSE   This patient presents to the ED for concern of right knee pain, this involves an extensive number of treatment options, and is a complaint that carries with it a high risk of complications and morbidity.  The differential diagnosis includes gouty arthritis, pseudogout, septic arthritis, osteoarthritis, traumatic arthritis.     Lab Tests:   I Ordered, reviewed, and interpreted labs, which included C, CMP, ESR, CRP and arthrocentesis labs that showed no e/o crystals or elevated WBC to suspect septic arthritis.   Medicines ordered:   I ordered medication fentanyl/lr bolus which seemed to help.    Imaging Studies ordered:   I independently visualized and interpreted imaging R knee xray which showed no obvious abnormalities  Additional history obtained:   Additional history obtained from noone  Previous records obtained and reviewed in epic  Consultations Obtained:   I consulted noone  and discussed lab and imaging findings  Reevaluation:  After the interventions stated above, I reevaluated the patient and found improved symptoms. Will give ace wrap, crutches, start NSAID's, ask to follow up with her ortho.  A medical screening exam was performed and I feel the patient has had an appropriate workup for their chief complaint at this time and likelihood of emergent condition existing is low. They have been counseled on decision, discharge, follow up and which symptoms necessitate immediate return to the emergency department. They or their family verbally stated understanding and agreement with plan and discharged in stable condition.   ____________________________________________  FINAL CLINICAL IMPRESSION(S) / ED DIAGNOSES  Final diagnoses:  Acute pain of right knee    MEDICATIONS GIVEN DURING THIS VISIT:  Medications  lactated ringers bolus  1,000 mL (1,000 mLs Intravenous New Bag/Given 07/25/19 0449)  lidocaine-EPINEPHrine (XYLOCAINE W/EPI) 2 %-1:100000 (with pres) injection 20 mL (20 mLs Intradermal Given by Other 07/25/19 0448)  lidocaine-EPINEPHrine (XYLOCAINE W/EPI) 2 %-1:200000 (PF) injection (  Given by Other 07/25/19 0448)  fentaNYL (SUBLIMAZE) injection 50 mcg (50 mcg Intravenous Given 07/25/19 0449)    NEW OUTPATIENT MEDICATIONS STARTED DURING THIS VISIT:  New Prescriptions   IBUPROFEN (ADVIL) 800 MG TABLET    Take 0.5 tablets (400 mg total) by mouth 3 (three)  times daily.    Note:  This note was prepared with assistance of Dragon voice recognition software. Occasional wrong-word or sound-a-like substitutions may have occurred due to the inherent limitations of voice recognition software.   Jerrit Horen, Corene Cornea, MD 07/25/19 0720  Apparently the patient had difficulty ambulating with her crutches.  She lives by herself and it does not seem that she is safe to be discharged at this time.  Will contact PT/OT to see if they can help her get on her crutches and kind of what her ambulatory status is secondary to this pain.  She is able to do that then I think she is safe to go home however if not then may need placement based on physical therapy recommendations.    Darcelle Herrada, Corene Cornea, MD 07/25/19 [phone]

## 2019-07-25 NOTE — Social Work (Addendum)
TOC CSW consulted with pt about needs.  Pt wants to go to a SNF.  CSW presented pt with Medicare list of nursing home facilities.    @2 :00pm CSW spoke to pt. Pt provided verbal permission to create FL2 and send referrals out to [facility] via hub for placement.  @3 :15pm  CSW obtained PASSR # [facility].  CSW will continue to follow for dc needs.  Anniebell Bedore Tarpley-Carter, MSW, LCSW-A ED Transitions of [phone] Health [phone]

## 2019-07-25 NOTE — ED Notes (Signed)
Provider made aware that patient can not ambulate at this time; plan of care reviewed with patient; will attempt PT evaluation and if patient can not ambulate will attempt rehab or home health. Will call and inform patient's daughter per request.

## 2019-07-25 NOTE — [place] FL2 (Signed)
[place] MEDICAID FL2 LEVEL OF CARE SCREENING TOOL     IDENTIFICATION  Patient Name: Lindsay [name] Birthdate: 1945/03/31 Sex: female Admission Date (Current Location): 07/25/2019  [place] and [place] Number:  [address] Bast [id number] N Facility and Address:  [facility],  [address], [place] [address]      Provider Number: [id number]  Attending Physician Name and Address:  Default, Provider, MD  Relative Name and Phone Number:  Lesley, Atkin   [phone]    Current Level of Care: Hospital Recommended Level of Care: Skilled Nursing Facility Prior Approval Number:    Date Approved/Denied: 07/25/19 PASRR Number: [id number]  Discharge Plan: SNF    Current Diagnoses: Patient Active Problem List   Diagnosis Date Noted  . Hypothyroidism 01/12/2019  . Ganglion of flexor tendon sheath of right middle finger 07/15/2018  . Type 2 diabetes mellitus without complication, without long-term current use of insulin ([facility]) 02/11/2018  . Essential hypertension 02/11/2018  . Hyperlipidemia 02/11/2018  . Cough variant asthma 11/03/2017  . Chest pain 05/17/2016  . Obesity 05/17/2016  . COPD (chronic obstructive pulmonary disease) ([facility])   . Acid reflux   . Urinary tract infection, chronic   . Hyperglycemia   . Hypokalemia   . COPD exacerbation ([facility]) 01/24/2013    Orientation RESPIRATION BLADDER Height & Weight     Self, Time, Situation, Place  Normal Incontinent Weight:   Height:     BEHAVIORAL SYMPTOMS/MOOD NEUROLOGICAL BOWEL NUTRITION STATUS      Incontinent Diet (Carb-modified)  AMBULATORY STATUS COMMUNICATION OF NEEDS Skin   Limited Assist Verbally Normal                       Personal Care Assistance Level of Assistance  Bathing, Feeding, Dressing Bathing Assistance: Limited assistance Feeding assistance: Independent Dressing Assistance: Limited assistance     Functional Limitations Info             SPECIAL CARE FACTORS FREQUENCY  PT (By  licensed PT), OT (By licensed OT)     PT Frequency: 5 OT Frequency: 5            Contractures Contractures Info: Not present    Additional Factors Info  Code Status, Allergies Code Status Info: FULL CODE Allergies Info: Codeine, Lisinopril, Morphine And Related           Current Medications (07/25/2019):  This is the current hospital active medication list No current facility-administered medications for this encounter.   Current Outpatient Medications  Medication Sig Dispense Refill  . amoxicillin-clavulanate (AUGMENTIN) 875-125 MG tablet Take 1 tablet by mouth 2 (two) times daily. 13 tablet 0  . aspirin EC 81 MG tablet Take 81 mg by mouth daily.    . budesonide-formoterol (SYMBICORT) 80-4.5 MCG/ACT inhaler Inhale 2 puffs into the lungs 2 (two) times daily. 1 Inhaler 0  . cefUROXime (CEFTIN) 250 MG tablet Take 1 tablet (250 mg total) by mouth 2 (two) times daily with a meal. 14 tablet 0  . cetirizine (ZYRTEC) 10 MG tablet Take 10 mg by mouth daily.    . Cholecalciferol (VITAMIN D) 50 MCG (2000 UT) tablet Take 1 tablet (2,000 Units total) by mouth daily. 90 tablet 1  . Cholecalciferol (VITAMIN D3) 50 MCG (2000 UT) capsule Take 1 capsule (2,000 Units total) by mouth daily. Must keep scheduled follow=-up appt for future refills 30 capsule 0  . cyclobenzaprine (FLEXERIL) 10 MG tablet Take 1 tablet (10 mg total) by mouth 2 (two) times daily. 60  tablet 1  . hydrOXYzine (ATARAX/VISTARIL) 10 MG tablet Take 1 tablet (10 mg total) by mouth 3 (three) times daily as needed. 30 tablet 0  . ibuprofen (ADVIL) 800 MG tablet Take 0.5 tablets (400 mg total) by mouth 3 (three) times daily. 21 tablet 0  . levothyroxine (SYNTHROID) 50 MCG tablet Take 1 tablet (50 mcg total) by mouth as directed. 2 tablets on Sundays, 1 tablet rest of the week 102 tablet 4  . losartan (COZAAR) 50 MG tablet Take 1 tablet (50 mg total) by mouth daily. Must keep scheduled follow=-up appt for future refills 30 tablet 0   . metFORMIN (GLUCOPHAGE) 500 MG tablet TAKE 1 TABLET(500 MG) BY MOUTH DAILY. 90 tablet 1  . montelukast (SINGULAIR) 10 MG tablet Take 1 tablet (10 mg total) by mouth daily as needed (congestion). 90 tablet 3  . oxybutynin (DITROPAN) 5 MG tablet TK 1 T PO BID    . pantoprazole (PROTONIX) 20 MG tablet Take 1 tablet (20 mg total) by mouth daily. 90 tablet 3  . polyvinyl alcohol (LIQUIFILM TEARS) 1.4 % ophthalmic solution Place 1 drop into both eyes as needed for dry eyes (red eyes).    . simvastatin (ZOCOR) 40 MG tablet Take 1 tablet (40 mg total) by mouth daily at 6 PM. 90 tablet 3  . traMADol (ULTRAM) 50 MG tablet Take 1 tablet (50 mg total) by mouth 2 (two) times daily as needed. 30 tablet 0  . triamcinolone cream (KENALOG) 0.1 % Apply 1 application topically 2 (two) times daily. 30 g 0  . trimethoprim (TRIMPEX) 100 MG tablet Take 100 mg by mouth at bedtime.  3  . VITAMIN D PO Take 1 capsule by mouth daily.       Discharge Medications: Please see discharge summary for a list of discharge medications.  Relevant Imaging Results:  Relevant Lab Results:   Additional Information [id number] Pt is diabetic (Type II)  Dorothe Pea Antoino Westhoff, LCSW

## 2019-07-25 NOTE — Progress Notes (Signed)
2nd shift ED CSW received a handoff from the 1st shift [facility] ED CSW.   Per chart review and per handoff pt provided verbal permission for [facility] to create an FL-2 and send referrals out to the community on pt's behalf for placement reasons.  Per EDP note from today, "Fall, knee pain, suspect inflammatory process, appropriate for discharge however patient is having ambulatory issues, will consult PT.  May need case management if she cannot ambulate safely.  Evaluated by PT, continued issues with ambulation.  Case management evaluating for possible home health support.  Signed out to default provider, will hold in the ED."  *End of EDP note*  CSW spoke to the EDP who states EDP will speak to the pt but per EDP's chart review EDP feels placement is appropriate at this time.  CSW will continue to follow for D/C needs.  Dorothe Pea. Jackie Russman  MSW, LCSW, LCAS, CCS Transitions of Care Clinical Social Worker Care Coordination Department Ph: [phone]

## 2019-07-25 NOTE — ED Provider Notes (Signed)
  Provider Note MRN:  [id number]  Arrival date & time: 07/25/19    ED Course and Medical Decision Making  Assumed care from Dr. Clayborne Dana at shift change.  Fall, knee pain, suspect inflammatory process, appropriate for discharge however patient is having ambulatory issues, will consult PT.  May need case management if she cannot ambulate safely.  Evaluated by PT, continued issues with ambulation.  Case management evaluating for possible home health support.  Signed out to default provider, will hold in the ED.  Procedures  Final Clinical Impressions(s) / ED Diagnoses     ICD-10-CM   1. Acute pain of right knee  M25.561     ED Discharge Orders         Ordered    ibuprofen (ADVIL) 800 MG tablet  3 times daily     Discontinue  Reprint     07/25/19 0702          Discharge Instructions   None     Elmer Sow. Pilar Plate, MD [facility] Emergency Medicine [facility] [email or web address]    Sabas Sous, MD 07/25/19 1210

## 2019-07-26 DIAGNOSIS — M25561 Pain in right knee: Secondary | ICD-10-CM | POA: Diagnosis not present

## 2019-07-26 LAB — GLUCOSE, BODY FLUID OTHER: Glucose, Body Fluid Other: 79 mg/dL

## 2019-07-26 LAB — PROTEIN, BODY FLUID (OTHER): Total Protein, Body Fluid Other: 3.3 g/dL

## 2019-07-26 MED ORDER — IBUPROFEN 200 MG PO TABS
600.0000 mg | ORAL_TABLET | Freq: Three times a day (TID) | ORAL | Status: DC | PRN
  Administered 2019-07-26 – 2019-07-27 (×2): 600 mg via ORAL
  Filled 2019-07-26 (×2): qty 3

## 2019-07-26 MED ORDER — METFORMIN HCL 500 MG PO TABS
500.0000 mg | ORAL_TABLET | Freq: Every day | ORAL | Status: DC
  Administered 2019-07-27: 500 mg via ORAL
  Filled 2019-07-26: qty 1

## 2019-07-26 MED ORDER — IBUPROFEN 800 MG PO TABS
800.0000 mg | ORAL_TABLET | Freq: Once | ORAL | Status: AC
  Administered 2019-07-26: 800 mg via ORAL
  Filled 2019-07-26: qty 1

## 2019-07-26 NOTE — Progress Notes (Signed)
CSW received a call from Reola Calkins # [id number]  Ref # [id number]  [facility] for [facility] days starting:  July 20th, 2021  Nexct Review Date: July 22nd.  Case Mgr: Caryl Ada  Fax#: [phone]  CSW will continue to follow for D/C needs.  Lindsay [name]. Lindsay [name]  MSW, LCSW, LCAS, CCS Transitions of Care Clinical Social Worker Care Coordination Department Ph: [phone]

## 2019-07-26 NOTE — Progress Notes (Addendum)
TOC CM spoke to pt at bedside. She has declined to go to [facility]. Contacted [facility] to make aware. She wants to go to [facility]. Contacted [facility], spoke to [place], Admission Coordinator and they do have available bed. Will give TOC CM a call to confirm. Isidoro Donning RN CCM, [facility] ED TOC CM [phone]  4:12 pm Received confirmation from [facility] that they can accept pt on tomorrow for rehab. Contacted [facility] to provided new facility.  Isidoro Donning RN CCM, [facility] ED TOC CM [phone]

## 2019-07-26 NOTE — ED Notes (Signed)
Pt declined Norco and requested Ibuprofen 800mg . Dr. notified.

## 2019-07-26 NOTE — ED Notes (Signed)
Pt repositioned for comfort and an ice pack was placed behind R knee. Case manager speaking with pt right now about placement as she did not want to go to [facility], or [facility] and she was accepted at [facility].

## 2019-07-26 NOTE — Social Work (Signed)
CSW called for auth update from [name].  Nothing to report at this time.  [facility] is patients choice.  CSW will continue to follow for dc needs.  [name], MSW, LCSW-A [facility] ED Transitions of Care Clinical Social Worker [facility] [phone]

## 2019-07-26 NOTE — Progress Notes (Signed)
CSW received a call from [place] at [facility] stating the patient has been offered a bed and has been accepted and that the pt can arrive today on 07/26/19.  The room number will be 113-P.  The number for report is [phone].  CSW will continue to follow for D/C needs.  Dorothe Pea. Demetri Kerman  MSW, LCSW, LCAS, CCS Transitions of Care Clinical Social Worker Care Coordination Department Ph: [phone]

## 2019-07-26 NOTE — Progress Notes (Signed)
CSW spoke to [place] at [facility] who asked that the CSWcall Hassel Neth at [facility] at ph: [phone] with the [facility] auth info.  CSW spoke to [place].  CSW will continue to follow for D/C needs.  Dorothe Pea. Larraine Argo  MSW, LCSW, LCAS, CCS Transitions of Care Clinical Social Worker Care Coordination Department Ph: [phone]

## 2019-07-26 NOTE — ED Provider Notes (Signed)
PT accepted to skilled nursing facility. [facility] transport contacted.   Makayla Lanter, Ambrose Finland, MD 07/26/19 [phone]

## 2019-07-26 NOTE — Progress Notes (Signed)
TOC CM spoke to [facility], approval remains for SNF. Pt will receive new reference number and auth number. Will contact [facility] tomorrow for new reference number and auth. ED provider updated that pt will dc 7/21 to [facility]. Isidoro Donning RN CCM, [facility] ED TOC CM [phone]

## 2019-07-26 NOTE — Social Work (Signed)
CSW has obtained [place] from [place] [phone]).  Pt will be going to [facility], room 123 B, and call report to [phone].  Please reconsult if new social work issues arise, CSW signing off.  Melody Cirrincione Tarpley-Carter, MSW, LCSW-A [facility] ED Transitions of Education administrator Health [phone]

## 2019-07-26 NOTE — Social Work (Signed)
CSW completed authorization process with [facility].  Faxed information to [phone] reference # [id number].  CSW will continue to follow for dc needs.  Alik Mawson Tarpley-Carter, MSW, LCSW-A [facility] ED Transitions of Education administrator Health [phone]

## 2019-07-26 NOTE — Progress Notes (Signed)
CSW received a call from [place] providing new auth ins info:  Ref#: [id number]  Auth#: Will be provided once it is generated which should be soon" and the Care Coordinator will have it when Fransico Him is called back.  Care Coordinator: Arist Dagout  3 days were approved/Start 07/26/19.  Fransico Him states that once admission into the SNF is confirmed then the start dare can be changed via the Care Coordinator.  Fax#: [phone]  CSW will continue to follow for D/C needs.  Dorothe Pea. Shashank Kwasnik  MSW, LCSW, LCAS, CCS Transitions of Care Clinical Social Worker Care Coordination Department Ph: [phone]

## 2019-07-27 ENCOUNTER — Encounter: Payer: Self-pay | Admitting: Family

## 2019-07-27 DIAGNOSIS — Z4789 Encounter for other orthopedic aftercare: Secondary | ICD-10-CM | POA: Diagnosis not present

## 2019-07-27 DIAGNOSIS — D649 Anemia, unspecified: Secondary | ICD-10-CM | POA: Diagnosis not present

## 2019-07-27 DIAGNOSIS — K219 Gastro-esophageal reflux disease without esophagitis: Secondary | ICD-10-CM | POA: Diagnosis not present

## 2019-07-27 DIAGNOSIS — I10 Essential (primary) hypertension: Secondary | ICD-10-CM | POA: Diagnosis not present

## 2019-07-27 DIAGNOSIS — M67441 Ganglion, right hand: Secondary | ICD-10-CM | POA: Diagnosis not present

## 2019-07-27 DIAGNOSIS — M4800 Spinal stenosis, site unspecified: Secondary | ICD-10-CM | POA: Diagnosis not present

## 2019-07-27 DIAGNOSIS — J449 Chronic obstructive pulmonary disease, unspecified: Secondary | ICD-10-CM | POA: Diagnosis not present

## 2019-07-27 DIAGNOSIS — M2550 Pain in unspecified joint: Secondary | ICD-10-CM | POA: Diagnosis not present

## 2019-07-27 DIAGNOSIS — Z7401 Bed confinement status: Secondary | ICD-10-CM | POA: Diagnosis not present

## 2019-07-27 DIAGNOSIS — Z87891 Personal history of nicotine dependence: Secondary | ICD-10-CM | POA: Diagnosis not present

## 2019-07-27 DIAGNOSIS — Z20822 Contact with and (suspected) exposure to covid-19: Secondary | ICD-10-CM | POA: Diagnosis not present

## 2019-07-27 DIAGNOSIS — Z743 Need for continuous supervision: Secondary | ICD-10-CM | POA: Diagnosis not present

## 2019-07-27 DIAGNOSIS — L0390 Cellulitis, unspecified: Secondary | ICD-10-CM | POA: Diagnosis not present

## 2019-07-27 DIAGNOSIS — Z9181 History of falling: Secondary | ICD-10-CM | POA: Diagnosis not present

## 2019-07-27 DIAGNOSIS — E785 Hyperlipidemia, unspecified: Secondary | ICD-10-CM | POA: Diagnosis not present

## 2019-07-27 DIAGNOSIS — E119 Type 2 diabetes mellitus without complications: Secondary | ICD-10-CM | POA: Diagnosis not present

## 2019-07-27 DIAGNOSIS — E039 Hypothyroidism, unspecified: Secondary | ICD-10-CM | POA: Diagnosis not present

## 2019-07-27 DIAGNOSIS — J45991 Cough variant asthma: Secondary | ICD-10-CM | POA: Diagnosis not present

## 2019-07-27 DIAGNOSIS — M25561 Pain in right knee: Secondary | ICD-10-CM | POA: Diagnosis not present

## 2019-07-27 DIAGNOSIS — M6281 Muscle weakness (generalized): Secondary | ICD-10-CM | POA: Diagnosis not present

## 2019-07-27 MED ORDER — LEVOTHYROXINE SODIUM 50 MCG PO TABS
50.0000 ug | ORAL_TABLET | Freq: Every day | ORAL | Status: DC
  Administered 2019-07-27: 50 ug via ORAL
  Filled 2019-07-27: qty 1

## 2019-07-27 MED ORDER — LIP MEDEX EX OINT
TOPICAL_OINTMENT | CUTANEOUS | Status: DC | PRN
  Filled 2019-07-27: qty 7

## 2019-07-27 NOTE — Social Work (Signed)
TOC CSW spoke with ED RN call report, faxing AVS, and calling [facility].  ED RN agreed with CSW that pt was ready for dc to [facility].  We have begun dc process.  Kitty at [facility] has confirmed receipt of AVS.    Please reconsult if new social work issues arise, CSW signing off.  Audris Speaker Tarpley-Carter, MSW, LCSW-A [facility] ED Transitions of Education administrator Health [phone]

## 2019-07-27 NOTE — Progress Notes (Signed)
Outside notes received. Information abstracted. Notes sent to scan.  

## 2019-07-27 NOTE — Social Work (Signed)
TOC CSW has spoken with Georgeann Oppenheim at [facility] [phone].  Sonny Dandy is waiting on AVS.  AVS has been requested from [place] by CSW.    [facility] rm #:  [id number]  Call report to [facility] RN:  [phone]  Dearies Meikle Tarpley-Carter, MSW, LCSW-A [facility] ED Transitions of Education administrator Health [phone]

## 2019-07-27 NOTE — ED Notes (Signed)
Called report to [facility] and faxed over paperwork.

## 2019-07-27 NOTE — Social Work (Signed)
@  8:46am  TOC CSW received auth# [id number] from [facility].  @8 :51am TOC CSW left HIPPA compliant message with my contact information for Tonya at [facility] [phone].  @8 :56am TOC CSW spoke with [phone] at [place] about pt.  CSW updated Tonya with auth# and review contact and their information (Artist Dagout [email or web address]), review date of 07/28/2019, 3 day approval for current auth#, and fax # [phone]).   CSW is currently awaiting Tonya at [facility] to call back with room # and call report information.  CSW will continue to follow for dc needs.  Gaige Fussner Tarpley-Carter, MSW, LCSW-A [phone] ED Transitions of [facility] [phone]

## 2019-07-27 NOTE — Progress Notes (Signed)
TOC CM spoke to ED RN and AVS was faxed to [facility]. [facility] called for transport. Isidoro Donning RN CCM, [facility] ED TOC CM [phone]

## 2019-07-28 ENCOUNTER — Encounter: Payer: Self-pay | Admitting: Internal Medicine

## 2019-07-28 ENCOUNTER — Non-Acute Institutional Stay (SKILLED_NURSING_FACILITY): Payer: Medicare Other | Admitting: Internal Medicine

## 2019-07-28 DIAGNOSIS — E119 Type 2 diabetes mellitus without complications: Secondary | ICD-10-CM

## 2019-07-28 DIAGNOSIS — D649 Anemia, unspecified: Secondary | ICD-10-CM | POA: Diagnosis not present

## 2019-07-28 DIAGNOSIS — M25561 Pain in right knee: Secondary | ICD-10-CM | POA: Diagnosis not present

## 2019-07-28 DIAGNOSIS — E039 Hypothyroidism, unspecified: Secondary | ICD-10-CM

## 2019-07-28 LAB — BODY FLUID CULTURE: Culture: NO GROWTH

## 2019-07-28 NOTE — Assessment & Plan Note (Signed)
Stable anemia H/H 10.1/32.2 with minimal hypochromia with an MCH of 25.4. No bleeding dyscrasias reported.  Past medical history of GERD.

## 2019-07-28 NOTE — Assessment & Plan Note (Addendum)
Varus deformity present clinically. Orthopedic follow-up with Dr. Regino Schultze. Short course of oral anti-inflammatory agents as well as topical Voltaren gel.

## 2019-07-28 NOTE — Patient Instructions (Signed)
See assessment and plan under each diagnosis in the problem list and acutely for this visit 

## 2019-07-28 NOTE — Progress Notes (Signed)
NURSING HOME LOCATION:  [facility] ROOM NUMBER:  112/A  CODE STATUS: Full Code   TKW:[id number], Allyne Gee, FNP    This is a comprehensive admission note to [facility] performed on this date less than 30 days from date of admission. Included are preadmission medical/surgical history; reconciled medication list; family history; social history and comprehensive review of systems.  Corrections and additions to the records were documented. Comprehensive physical exam was also performed. Additionally a clinical summary was entered for each active diagnosis pertinent to this admission in the Problem List to enhance continuity of care.  HPI: Patient was in the ED 7/19-7/21/2021 with acute right knee pain.  She was ambulating and had acute pain in the right knee with buckling resulting in a fall.  She stated she the pain started before she actually fell to the ground.  Since that time she has had difficulty walking because of the pain.The knee effusion was tapped;C&S of effusion revealed NG @ 3 days. Clinically she was felt to have an inflammatory process but placement in the SNF for PT/OT was recommended because of ambulatory issues. The above scenario is in the context of being started on oral Augmentin for clinical cellulitis of the left hand in the ED 7/17.  Imaging was negative.  Labs in the ED revealed a stable anemia H/H 10.1/32.2 with minimal hypochromia with an MCH of 25.4.  Sed rate was 90. Comprehensive metabolic profile was normal except for a glucose of 104.  The most recent A1c was 6.4% on 4/26.  Also on that date her TSH was mildly -moderately elevated at 7.59.  Prior value for TSH had been 13.91 indicating improved hypothyroidism.   Past medical and surgical history: Includes history of spinal stenosis, hypothyroidism, GERD, type 2 diabetes, essential hypertension, diabetes, dyslipidemia, COPD, and history of asthma. Surgeries and procedures include abdominal  hysterectomy and cholecystectomy.  Social history: Nondrinker; former smoker( history of 10 pack years)  Family history: Reviewed, noncontributory due to age.   Review of systems: She does state that the dorsum of the left hand was swollen and slightly red.  It has improved with the oral antibiotics.  She has seen Dr. Regino Schultze, Orthopedics for back pain related to spinal stenosis.  She has not been treated for any knee issues in the past.  She has chronic eczema which is stable at this time.  She also has intermittent constipation.  She denies persistent anxiety depression but stated that she had "some crying before I left the hospital".  Constitutional: No fever, significant weight change, fatigue  Eyes: No redness, discharge, pain, vision change ENT/mouth: No nasal congestion, purulent discharge, earache, change in hearing, sore throat  Cardiovascular: No chest pain, palpitations, paroxysmal nocturnal dyspnea, claudication, edema  Respiratory: No cough, sputum production, hemoptysis, DOE, significant snoring, apnea  Gastrointestinal: No heartburn, dysphagia, abdominal pain, nausea /vomiting, rectal bleeding, melena Genitourinary: No dysuria, hematuria, pyuria, incontinence, nocturia Dermatologic: No rash, pruritus,new change in appearance of skin Neurologic: No dizziness, headache, syncope, seizures, numbness, tingling Psychiatric: No significant insomnia, anorexia Endocrine: No change in hair/skin/nails, excessive thirst, excessive hunger, excessive urination  Hematologic/lymphatic: No significant bruising, lymphadenopathy, abnormal bleeding Allergy/immunology: No itchy/watery eyes, significant sneezing, urticaria, angioedema  Physical exam:  Pertinent or positive findings: The maxilla is edentulous.  She has few mandibular teeth.  A grade 1/2 systolic murmur is present at the left sternal border.  She has varus deformities in the knee.  There is visible and palpable effusion of the right knee.  Pes planus is present.  General appearance: Adequately nourished; no acute distress, increased work of breathing is present.   Lymphatic: No lymphadenopathy about the head, neck, axilla. Eyes: No conjunctival inflammation or lid edema is present. There is no scleral icterus. Ears:  External ear exam shows no significant lesions or deformities.   Nose:  External nasal examination shows no deformity or inflammation. Nasal mucosa are pink and moist without lesions, exudates Oral exam: Lips and gums are healthy appearing.There is no oropharyngeal erythema or exudate. Neck:  No thyromegaly, masses, tenderness noted.    Heart:  Normal rate and regular rhythm. S1 and S2 normal without gallop, click, rub.  Lungs: Chest clear to auscultation without wheezes, rhonchi, rales, rubs. Abdomen: Bowel sounds are normal.  Abdomen is soft and nontender with no organomegaly, hernias, masses. GU: Deferred  Extremities:  No cyanosis, clubbing, edema. Neurologic exam:  Balance, Rhomberg, finger to nose testing could not be completed due to clinical state Skin: Warm & dry w/o tenting. No significant lesions or rash.  See clinical summary under each active problem in the Problem List with associated updated therapeutic plan

## 2019-07-28 NOTE — Assessment & Plan Note (Signed)
05/02/2019 TSH mild-moderately elevated at 7.59.  This was markedly improved from the prior value of 13.91.  PCP can update her TSH post discharge from SNF.

## 2019-07-28 NOTE — Assessment & Plan Note (Signed)
In the ED glucose was 104.  Most recent A1c was 6.4% on 4/26, prediabetic value.  PCP can update A1c.

## 2019-08-01 ENCOUNTER — Ambulatory Visit: Payer: Medicare Other | Admitting: Family

## 2019-08-04 ENCOUNTER — Non-Acute Institutional Stay (SKILLED_NURSING_FACILITY): Payer: Medicare Other | Admitting: Adult Health

## 2019-08-04 ENCOUNTER — Encounter: Payer: Self-pay | Admitting: Adult Health

## 2019-08-04 DIAGNOSIS — N3281 Overactive bladder: Secondary | ICD-10-CM

## 2019-08-04 DIAGNOSIS — M25561 Pain in right knee: Secondary | ICD-10-CM | POA: Diagnosis not present

## 2019-08-04 DIAGNOSIS — I10 Essential (primary) hypertension: Secondary | ICD-10-CM

## 2019-08-04 DIAGNOSIS — E039 Hypothyroidism, unspecified: Secondary | ICD-10-CM

## 2019-08-04 DIAGNOSIS — J449 Chronic obstructive pulmonary disease, unspecified: Secondary | ICD-10-CM | POA: Diagnosis not present

## 2019-08-04 DIAGNOSIS — E119 Type 2 diabetes mellitus without complications: Secondary | ICD-10-CM | POA: Diagnosis not present

## 2019-08-04 DIAGNOSIS — K5901 Slow transit constipation: Secondary | ICD-10-CM

## 2019-08-04 NOTE — Progress Notes (Signed)
Location:  [facility] Room Number: 112-A Place of Service:  SNF (31) Provider:  Kenard GowerMedina-Vargas, Cesar Alf, DNP, FNP-BC  Patient Care Team: Olive BassMurray, Laura Woodruff, FNP as PCP - General (Internal Medicine)  Extended Emergency Contact Information Primary Emergency Contact: Idelia SalmNowlan,Ketha  [place] Mobile Phone: [phone] Relation: Daughter  Code Status:  Full Code  Goals of care: Advanced Directive information Advanced Directives 07/28/2019  Does Patient Have a Medical Advance Directive? Yes  Type of Advance Directive (No Data)  Does patient want to make changes to medical advance directive? No - Patient declined  Would patient like information on creating a medical advance directive? -     Chief Complaint  Patient presents with  . Medical Management of Chronic Issues    Routine short-term rehabilitation visit    HPI:  Pt is a 74 y.o. female seen today for medical management of chronic diseases.  She has a PMH of acid reflux, asthma, COPD, hypokalemia, obesity and spinal stenosis.  She was seen in her room today.  She complained of constipation.  [facility]19 post [place] ED stay 07/25/19 to 07/27/19 S/P fall and started having pain on her right knee. She reported walking and all of a sudden had a sudden pain in her right knee and it buckled and she fell down. Since that time, she had difficulty walking secondary to pain in her knee.  Imaging showed large suprapatellar joint effusion.  Joint aspiration/arthrocentesis was done on 07/25/2019 and culture showed no growth. After the joint aspiration, her symptoms improved. Evaluated by PT, had continued issues with ambulation so [facility] was [facility] for short-term rehabilitation.   Past Medical History:  Diagnosis Date  . Acid reflux   . Asthma   . COPD (chronic obstructive pulmonary disease) (HCC)   . Hyperglycemia    . Hypokalemia   . Obesity   . Spinal stenosis   . Urinary tract infection, chronic    Past Surgical History:  Procedure Laterality Date  . ABDOMINAL HYSTERECTOMY    . ANKLE FUSION Left   . CHOLECYSTECTOMY      Allergies  Allergen Reactions  . Codeine Nausea Only  . Lisinopril     cough  . Morphine And Related Itching  . Other Other (See Comments)    Seafood : itching    Outpatient Encounter Medications as of 08/04/2019  Medication Sig  . aspirin EC 81 MG tablet Take 81 mg by mouth daily.  . budesonide-formoterol (SYMBICORT) 80-4.5 MCG/ACT inhaler Inhale 2 puffs into the lungs 2 (two) times daily.  . cetirizine (ZYRTEC) 10 MG tablet Take 10 mg by mouth daily. FOR ASTHMA  . Cholecalciferol (VITAMIN D3) 50 MCG (2000 UT) capsule Take 1 capsule (2,000 Units total) by mouth daily. Must keep scheduled follow=-up appt for future refills  . cyclobenzaprine (FLEXERIL) 10 MG tablet Take 1 tablet (10 mg total) by mouth 2 (two) times daily.  . diclofenac Sodium (VOLTAREN) 1 % GEL Apply 4 g topically in the morning and at bedtime. Apply to right knee  . docusate sodium (COLACE) 100 MG capsule Take 100 mg by mouth 2 (two) times daily.  . hydrOXYzine (ATARAX/VISTARIL) 10 MG tablet Take 1 tablet (10 mg total) by mouth 3 (three) times daily as needed.  Marland Kitchen. levothyroxine (SYNTHROID) 50 MCG tablet Take 50 mcg by mouth See admin instructions. Take daily except for Sundays  . losartan (COZAAR) 50 MG tablet  Take 1 tablet (50 mg total) by mouth daily. Must keep scheduled follow=-up appt for future refills  . meloxicam (MOBIC) 7.5 MG tablet Take 7.5 mg by mouth daily.  . metFORMIN (GLUCOPHAGE) 500 MG tablet TAKE 1 TABLET(500 MG) BY MOUTH DAILY.  . NON FORMULARY DIET- REGULAR CCD  . oxybutynin (DITROPAN) 5 MG tablet Take 5 mg by mouth 2 (two) times daily.   . pantoprazole (PROTONIX) 20 MG tablet Take 1 tablet (20 mg total) by mouth daily.  . polyvinyl alcohol (LIQUIFILM TEARS) 1.4 % ophthalmic solution  Place 1 drop into both eyes as needed for dry eyes.   . simvastatin (ZOCOR) 40 MG tablet Take 1 tablet (40 mg total) by mouth daily at 6 PM.  . traMADol (ULTRAM) 50 MG tablet Take 1 tablet (50 mg total) by mouth 2 (two) times daily as needed.  . trimethoprim (TRIMPEX) 100 MG tablet Take 100 mg by mouth daily.   . [DISCONTINUED] ibuprofen (ADVIL) 800 MG tablet Take 0.5 tablets (400 mg total) by mouth 3 (three) times daily.  . [DISCONTINUED] levothyroxine (SYNTHROID) 100 MCG tablet Take 100 mcg by mouth every Sunday. FOR HYPOTHYROIDISM   No facility-administered encounter medications on file as of 08/04/2019.    Review of Systems  GENERAL: No change in appetite, no fatigue, no weight changes, no fever, chills or weakness MOUTH and THROAT: Denies oral discomfort, gingival pain or bleeding RESPIRATORY: no cough, SOB, DOE, wheezing, hemoptysis CARDIAC: No chest pain or palpitations GI: +constipation GU: Denies dysuria, frequency, hematuria, incontinence, or discharge NEUROLOGICAL: Denies dizziness, syncope, numbness, or headache PSYCHIATRIC: Denies feelings of depression or anxiety. No report of hallucinations, insomnia, paranoia, or agitation   Immunization History  Administered Date(s) Administered  . Fluad Quad(high Dose 65+) 09/09/2018  . Influenza, High Dose Seasonal PF 03/11/2018  . Moderna SARS-COVID-2 Vaccination 02/18/2019, 03/18/2019  . Pneumococcal Polysaccharide-23 11/04/2016  . Zoster Recombinat (Shingrix) 11/03/2016   Pertinent  Health Maintenance Due  Topic Date Due  . PNA vac Low Risk Adult (2 of 2 - PCV13) 11/04/2017  . OPHTHALMOLOGY EXAM  06/08/2019  . INFLUENZA VACCINE  08/07/2019  . FOOT EXAM  09/14/2019  . HEMOGLOBIN A1C  11/01/2019  . MAMMOGRAM  11/23/2020  . DEXA SCAN  Completed   Fall Risk  02/11/2018  Falls in the past year? 0  Number falls in past yr: 0  Injury with Fall? 0     Vitals:   08/04/19 1145  BP: (!) 136/73  Pulse: 74  Resp: 18  Temp:  98.3 F (36.8 C)  TempSrc: Oral  Weight: 195 lb (88.5 kg)  Height: 5\' 3"  (1.6 m)   Body mass index is 34.54 kg/m.  Physical Exam  GENERAL APPEARANCE: Well nourished. In no acute distress.  Obese  SKIN:  Skin is warm and dry.  MOUTH and THROAT: Lips are without lesions. Oral mucosa is moist and without lesions. Tongue is normal in shape, size, and color and without lesions RESPIRATORY: Breathing is even & unlabored, BS CTAB CARDIAC: RRR, no murmur,no extra heart sounds, right foot 1+ edema GI: Abdomen soft, normal BS, no masses, no tenderness EXTREMITIES: Able to move x4 extremities NEUROLOGICAL: There is no tremor. Speech is clear.  Alert and oriented X 3. PSYCHIATRIC: Affect and behavior are appropriate  Labs reviewed: Recent Labs    12/14/18 1636 05/02/19 1327 07/25/19 0221  NA 138 139 138  K 4.1 3.7 3.6  CL 104 105 106  CO2 27 28 21*  GLUCOSE 94 87  104*  BUN 21 18 15   CREATININE 0.90 0.85 0.68  CALCIUM 10.3 10.0 9.7  MG 1.9  --   --    Recent Labs    12/14/18 1636 05/02/19 1327 07/25/19 0221  AST 11 13 13*  ALT 10 9 11   ALKPHOS 60 60 56  BILITOT 0.3 0.3 0.7  PROT 7.5 7.7 7.8  ALBUMIN 4.3 4.2 3.9   Recent Labs    12/14/18 1636 12/14/18 1636 01/11/19 1055 05/02/19 1327 07/25/19 0221  WBC 10.4   < > 8.3 9.1 11.2*  NEUTROABS 6.1  --   --  5.4 7.9*  HGB 10.9*   < > 10.5* 10.8* 10.1*  HCT 34.3*   < > 32.2* 34.0* 32.2*  MCV 82.0   < > 81.4 80.6 80.9  PLT 384.0   < > 372.0 386.0 374   < > = values in this interval not displayed.   Lab Results  Component Value Date   TSH 7.59 (H) 05/02/2019   Lab Results  Component Value Date   HGBA1C 6.4 05/02/2019   Lab Results  Component Value Date   CHOL 171 02/11/2018   HDL 45.40 02/11/2018   LDLCALC 111 (H) 02/11/2018   TRIG 72.0 02/11/2018   CHOLHDL 4 02/11/2018    Significant Diagnostic Results in last 30 days:  MR LUMBAR SPINE WO CONTRAST  Result Date: 07/08/2019 CLINICAL DATA:  Low back pain  without sciatica EXAM: MRI LUMBAR SPINE WITHOUT CONTRAST TECHNIQUE: Multiplanar, multisequence MR imaging of the lumbar spine was performed. No intravenous contrast was administered. COMPARISON:  03/15/2012 FINDINGS: Segmentation: Standard lumbar numbering based on the available coverage Alignment: Dextroscoliosis. Grade 1 anterolisthesis at L4-5 and L5-S1. Vertebrae:  Mild discogenic endplate edema at 09/08/2019, interval Conus medullaris and cauda equina: Conus extends to the L1 level. Conus and cauda equina appear normal except for the cauda equina at levels of degenerative compression. Paraspinal and other soft tissues: Negative Disc levels: T12- L1: A superiorly migrating right paracentral extrusion has regressed. No neural compression L1-L2: Downward pointing left paracentral extrusion effacing the subarticular recess, chronic and potentially symptomatic. There is asymmetric left facet spurring and left foraminal impingement. L2-L3: Progressive disc narrowing with bulging and endplate ridging. Degenerative facet spurring. Moderate to advanced spinal stenosis. Moderate left foraminal impingement L3-L4: Advanced disc narrowing with bulging and ridging. Degenerative facet spurring on both sides. High-grade spinal stenosis when dorsal epidural fat expansion is combined with degenerative factors. Left more than right foraminal impingement. L4-L5: Facet osteoarthritis with bulky hypertrophy. Mild anterolisthesis. Disc narrowing and bulging with endplate spurring. Spinal stenosis is severe. Moderate bilateral foraminal narrowing L5-S1:Facet osteoarthritis with spurring on the right more than left. Anterolisthesis and disc bulging. Bilateral subarticular recess narrowing that could affect either S1 nerve root. Moderate right foraminal narrowing. IMPRESSION: 1. Advanced and diffuse degenerative disease with dextroscoliosis and generalized progression from 2014. 2. 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 Left foraminal impingement. 3. L2-3 moderate to advanced spinal stenosis with left foraminal impingement. 4. L3-4 advanced spinal stenosis and left more than right foraminal impingement 5. L4-5 severe spinal stenosis. Electronically Signed   By: 05/15/2012 M.D.   On: 07/08/2019 08:10   DG Knee Complete 4 Views Right  Result Date: 07/25/2019 CLINICAL DATA:  Pain. EXAM: RIGHT KNEE - COMPLETE 4+ VIEW COMPARISON:  10/13/2013 FINDINGS: There are advanced tricompartmental degenerative changes of the right knee, greatest within the lateral compartment. This has significantly progressed since 2015. There is a large  suprapatellar joint effusion. There is no definite acute displaced fracture or dislocation. IMPRESSION: 1. No definite acute displaced fracture or dislocation. 2. Large suprapatellar joint effusion. 3. 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 Electronically Signed   By: Katherine Mantle M.D.   On: 07/25/2019 02:20   DG Hand Complete Left  Result Date: 07/23/2019 CLINICAL DATA:  Nontraumatic left hand pain and swelling. EXAM: LEFT HAND - COMPLETE 3+ VIEW COMPARISON:  None. FINDINGS: Negative for acute fracture or dislocation in left hand. Irregularity of the distal radius and ulna are suggestive for old injuries. Mild degenerative changes in the carpal bones. Overall alignment of the left hand is within normal limits. IMPRESSION: No acute abnormality to left hand. Electronically Signed   By: Richarda Overlie M.D.   On: 07/23/2019 10:34    Assessment/Plan  1. Acute pain of right knee -  S/P joint aspiration/arthrocentesis on 07/25/2019 -Completed ibuprofen, diclofenac gel and meloxicam x1 week,  - continue PRN tramadol, PRN cyclobenzaprine -  For PT and OT, for therapeutic extremity exercises, fall precautions  2. Essential hypertension -Stable, continue losartan  3. Chronic obstructive pulmonary disease,  unspecified COPD type (HCC) -No SOB nor wheezing -Continue budesonide-formoterol and cetirizine  4. Hypothyroidism, unspecified type Lab Results  Component Value Date   TSH 7.59 (H) 05/02/2019   -Continue levothyroxine  5. Type 2 diabetes mellitus without complication, without long-term current use of insulin (HCC) Lab Results  Component Value Date   HGBA1C 6.4 05/02/2019   -Continue metformin and CBG checks  6. OAB (overactive bladder) -Stable, continue oxybutynin  7. Slow transit constipation -We will start Colace 100 mg twice a day      Family/ staff Communication: Discussed plan of care with resident and charge nurse.  Labs/tests ordered: None  Goals of care:   Short-term care   Kenard Gower, DNP, FNP-BC [facility] [phone] (Monday-Friday 8:00 a.m. - 5:00 p.m.) [phone] (after hours)

## 2019-08-05 ENCOUNTER — Non-Acute Institutional Stay (SKILLED_NURSING_FACILITY): Payer: Medicare Other | Admitting: Adult Health

## 2019-08-05 ENCOUNTER — Encounter: Payer: Self-pay | Admitting: Adult Health

## 2019-08-05 DIAGNOSIS — J449 Chronic obstructive pulmonary disease, unspecified: Secondary | ICD-10-CM | POA: Diagnosis not present

## 2019-08-05 DIAGNOSIS — N390 Urinary tract infection, site not specified: Secondary | ICD-10-CM

## 2019-08-05 DIAGNOSIS — M25561 Pain in right knee: Secondary | ICD-10-CM

## 2019-08-05 DIAGNOSIS — L299 Pruritus, unspecified: Secondary | ICD-10-CM

## 2019-08-05 DIAGNOSIS — E039 Hypothyroidism, unspecified: Secondary | ICD-10-CM | POA: Diagnosis not present

## 2019-08-05 DIAGNOSIS — I10 Essential (primary) hypertension: Secondary | ICD-10-CM | POA: Diagnosis not present

## 2019-08-05 DIAGNOSIS — E119 Type 2 diabetes mellitus without complications: Secondary | ICD-10-CM

## 2019-08-05 DIAGNOSIS — N3281 Overactive bladder: Secondary | ICD-10-CM

## 2019-08-05 DIAGNOSIS — E785 Hyperlipidemia, unspecified: Secondary | ICD-10-CM

## 2019-08-05 DIAGNOSIS — K5901 Slow transit constipation: Secondary | ICD-10-CM

## 2019-08-05 NOTE — Progress Notes (Addendum)
Location:  [facility] Room Number: 112-A Place of Service:  SNF (31) Provider:  Kenard Gower, DNP, FNP-BC  Patient Care Team: Olive Bass, FNP as PCP - General (Internal Medicine)  Extended Emergency Contact Information Primary Emergency Contact: Idelia Salm [place] Mobile Phone: [phone] Relation: Daughter  Code Status:  FULL CODE  Goals of care: Advanced Directive information Advanced Directives 07/28/2019  Does Patient Have a Medical Advance Directive? Yes  Type of Advance Directive (No Data)  Does patient want to make changes to medical advance directive? No - Patient declined  Would patient like information on creating a medical advance directive? -     Chief Complaint  Patient presents with  . Discharge Note    Patient is seen for discharge home on 08/09/19.     HPI:  Pt is a 74 y.o. female seen today for discharge. She is to discharge home on 08/09/19 with Home health PT and OT.  She was admitted to [facility] on 07/27/2019 post [facility] ED stay 07/25/2019 to 07/27/2019 S/P fall then she started having pain on her right knee.  She reported walking and all of a sudden had a sudden pain in her right knee then it buckled causing her to fall down.  Since that time, she has difficulty walking secondary to pain on her knee.  Imaging showed large suprapatellar joint effusion.  Joint aspiration/arthrocentesis was done on 07/25/2019 and culture showed no growth.  After the joint aspiration, her symptoms improved.  She has a PMH of acid reflux, asthma, COPD, hypokalemia, obesity and spinal stenosis.  Patient was admitted to this facility for short-term rehabilitation after the patient's recent hospitalization.  Patient has completed SNF rehabilitation and therapy has cleared the patient for discharge.   Past Medical History:  Diagnosis Date  . Acid reflux   . Asthma   . COPD (chronic  obstructive pulmonary disease) (HCC)   . Hyperglycemia   . Hypokalemia   . Obesity   . Spinal stenosis   . Urinary tract infection, chronic    Past Surgical History:  Procedure Laterality Date  . ABDOMINAL HYSTERECTOMY    . ANKLE FUSION Left   . CHOLECYSTECTOMY      Allergies  Allergen Reactions  . Codeine Nausea Only  . Lisinopril     cough  . Morphine And Related Itching  . Other Other (See Comments)    Seafood : itching    Outpatient Encounter Medications as of 08/05/2019  Medication Sig  . aspirin EC 81 MG tablet Take 81 mg by mouth daily.  . budesonide-formoterol (SYMBICORT) 80-4.5 MCG/ACT inhaler Inhale 2 puffs into the lungs 2 (two) times daily.  . cetirizine (ZYRTEC) 10 MG tablet Take 10 mg by mouth daily. FOR ASTHMA  . Cholecalciferol (VITAMIN D3) 50 MCG (2000 UT) capsule Take 1 capsule (2,000 Units total) by mouth daily. Must keep scheduled follow=-up appt for future refills  . cyclobenzaprine (FLEXERIL) 10 MG tablet Take 1 tablet (10 mg total) by mouth 2 (two) times daily.  . diclofenac Sodium (VOLTAREN) 1 % GEL Apply 4 g topically in the morning and at bedtime. Apply to right knee  . docusate sodium (COLACE) 100 MG capsule Take 100 mg by mouth 2 (two) times daily.  . hydrOXYzine (ATARAX/VISTARIL) 10 MG tablet Take 1 tablet (10 mg total) by mouth 3 (three) times daily as needed.  Marland Kitchen levothyroxine (SYNTHROID) 50 MCG tablet Take 50 mcg by mouth See admin  instructions. Take daily except for Sundays  . losartan (COZAAR) 50 MG tablet Take 1 tablet (50 mg total) by mouth daily. Must keep scheduled follow=-up appt for future refills  . meloxicam (MOBIC) 7.5 MG tablet Take 7.5 mg by mouth daily.  . metFORMIN (GLUCOPHAGE) 500 MG tablet TAKE 1 TABLET(500 MG) BY MOUTH DAILY.  . NON FORMULARY DIET- REGULAR CCD  . oxybutynin (DITROPAN) 5 MG tablet Take 5 mg by mouth 2 (two) times daily.   . pantoprazole (PROTONIX) 20 MG tablet Take 1 tablet (20 mg total) by mouth daily.  .  polyvinyl alcohol (LIQUIFILM TEARS) 1.4 % ophthalmic solution Place 1 drop into both eyes as needed for dry eyes.   . simvastatin (ZOCOR) 40 MG tablet Take 1 tablet (40 mg total) by mouth daily at 6 PM.  . traMADol (ULTRAM) 50 MG tablet Take 1 tablet (50 mg total) by mouth 2 (two) times daily as needed.  . trimethoprim (TRIMPEX) 100 MG tablet Take 100 mg by mouth daily.    No facility-administered encounter medications on file as of 08/05/2019.    Review of Systems  GENERAL: No change in appetite, no fatigue, no weight changes, no fever, chills or weakness MOUTH and THROAT: Denies oral discomfort, gingival pain or bleeding RESPIRATORY: no cough, SOB, DOE, wheezing, hemoptysis CARDIAC: No chest pain, edema or palpitations GI: No abdominal pain, diarrhea, constipation, heart burn, nausea or vomiting GU: Denies dysuria, frequency, hematuria, incontinence, or discharge NEUROLOGICAL: Denies dizziness, syncope, numbness, or headache PSYCHIATRIC: Denies feelings of depression or anxiety. No report of hallucinations, insomnia, paranoia, or agitation   Immunization History  Administered Date(s) Administered  . Fluad Quad(high Dose 65+) 09/09/2018  . Influenza, High Dose Seasonal PF 03/11/2018  . Moderna SARS-COVID-2 Vaccination 02/18/2019, 03/18/2019  . Pneumococcal Polysaccharide-23 11/04/2016  . Zoster Recombinat (Shingrix) 11/03/2016   Pertinent  Health Maintenance Due  Topic Date Due  . PNA vac Low Risk Adult (2 of 2 - PCV13) 11/04/2017  . OPHTHALMOLOGY EXAM  06/08/2019  . INFLUENZA VACCINE  08/07/2019  . FOOT EXAM  09/14/2019  . HEMOGLOBIN A1C  11/01/2019  . MAMMOGRAM  11/23/2020  . DEXA SCAN  Completed   Fall Risk  02/11/2018  Falls in the past year? 0  Number falls in past yr: 0  Injury with Fall? 0     Vitals:   08/05/19 1257  BP: (!) 136/73  Pulse: 74  Resp: 18  Temp: 98.3 F (36.8 C)  TempSrc: Oral  Weight: 195 lb (88.5 kg)  Height: 5\' 3"  (1.6 m)   Body mass  index is 34.54 kg/m.  Physical Exam  GENERAL APPEARANCE: Well nourished. In no acute distress. Obese SKIN:  Skin is warm and dry.  MOUTH and THROAT: Lips are without lesions. Oral mucosa is moist and without lesions. Tongue is normal in shape, size, and color and without lesions RESPIRATORY: Breathing is even & unlabored, BS CTAB CARDIAC: RRR, no murmur,no extra heart sounds, no edema GI: Abdomen soft, normal BS, no masses, no tenderness EXTREMITIES: Able to move x4 extremities NEUROLOGICAL: There is no tremor. Speech is clear. Alert and oriented X 3. PSYCHIATRIC:  Affect and behavior are appropriate  Labs reviewed: Recent Labs    12/14/18 1636 05/02/19 1327 07/25/19 0221  NA 138 139 138  K 4.1 3.7 3.6  CL 104 105 106  CO2 27 28 21*  GLUCOSE 94 87 104*  BUN 21 18 15   CREATININE 0.90 0.85 0.68  CALCIUM 10.3 10.0 9.7  MG  1.9  --   --    Recent Labs    12/14/18 1636 05/02/19 1327 07/25/19 0221  AST 11 13 13*  ALT 10 9 11   ALKPHOS 60 60 56  BILITOT 0.3 0.3 0.7  PROT 7.5 7.7 7.8  ALBUMIN 4.3 4.2 3.9   Recent Labs    12/14/18 1636 12/14/18 1636 01/11/19 1055 05/02/19 1327 07/25/19 0221  WBC 10.4   < > 8.3 9.1 11.2*  NEUTROABS 6.1  --   --  5.4 7.9*  HGB 10.9*   < > 10.5* 10.8* 10.1*  HCT 34.3*   < > 32.2* 34.0* 32.2*  MCV 82.0   < > 81.4 80.6 80.9  PLT 384.0   < > 372.0 386.0 374   < > = values in this interval not displayed.   Lab Results  Component Value Date   TSH 7.59 (H) 05/02/2019   Lab Results  Component Value Date   HGBA1C 6.4 05/02/2019   Lab Results  Component Value Date   CHOL 171 02/11/2018   HDL 45.40 02/11/2018   LDLCALC 111 (H) 02/11/2018   TRIG 72.0 02/11/2018   CHOLHDL 4 02/11/2018    Significant Diagnostic Results in last 30 days:  MR LUMBAR SPINE WO CONTRAST  Result Date: 07/08/2019 CLINICAL DATA:  Low back pain without sciatica EXAM: MRI LUMBAR SPINE WITHOUT CONTRAST TECHNIQUE: Multiplanar, multisequence MR imaging of the  lumbar spine was performed. No intravenous contrast was administered. COMPARISON:  03/15/2012 FINDINGS: Segmentation: Standard lumbar numbering based on the available coverage Alignment: Dextroscoliosis. Grade 1 anterolisthesis at L4-5 and L5-S1. Vertebrae:  Mild discogenic endplate edema at 05/15/2012, interval Conus medullaris and cauda equina: Conus extends to the L1 level. Conus and cauda equina appear normal except for the cauda equina at levels of degenerative compression. Paraspinal and other soft tissues: Negative Disc levels: T12- L1: A superiorly migrating right paracentral extrusion has regressed. No neural compression L1-L2: Downward pointing left paracentral extrusion effacing the subarticular recess, chronic and potentially symptomatic. There is asymmetric left facet spurring and left foraminal impingement. L2-L3: Progressive disc narrowing with bulging and endplate ridging. Degenerative facet spurring. Moderate to advanced spinal stenosis. Moderate left foraminal impingement L3-L4: Advanced disc narrowing with bulging and ridging. Degenerative facet spurring on both sides. High-grade spinal stenosis when dorsal epidural fat expansion is combined with degenerative factors. Left more than right foraminal impingement. L4-L5: Facet osteoarthritis with bulky hypertrophy. Mild anterolisthesis. Disc narrowing and bulging with endplate spurring. Spinal stenosis is severe. Moderate bilateral foraminal narrowing L5-S1:Facet osteoarthritis with spurring on the right more than left. Anterolisthesis and disc bulging. Bilateral subarticular recess narrowing that could affect either S1 nerve root. Moderate right foraminal narrowing. IMPRESSION: 1. Advanced and diffuse degenerative disease with dextroscoliosis and generalized progression from 2014. 2. 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 Left foraminal impingement. 3. L2-3 moderate to advanced spinal stenosis with left foraminal  impingement. 4. L3-4 advanced spinal stenosis and left more than right foraminal impingement 5. L4-5 severe spinal stenosis. Electronically Signed   By: C9-4 M.D.   On: 07/08/2019 08:10   DG Knee Complete 4 Views Right  Result Date: 07/25/2019 CLINICAL DATA:  Pain. EXAM: RIGHT KNEE - COMPLETE 4+ VIEW COMPARISON:  10/13/2013 FINDINGS: There are advanced tricompartmental degenerative changes of the right knee, greatest within the lateral compartment. This has significantly progressed since 2015. There is a large suprapatellar joint effusion. There is no definite acute displaced fracture or dislocation. IMPRESSION: 1. No definite acute displaced  fracture or dislocation. 2. Large suprapatellar joint effusion. 3. 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 Electronically Signed   By: Katherine Mantle M.D.   On: 07/25/2019 02:20   DG Hand Complete Left  Result Date: 07/23/2019 CLINICAL DATA:  Nontraumatic left hand pain and swelling. EXAM: LEFT HAND - COMPLETE 3+ VIEW COMPARISON:  None. FINDINGS: Negative for acute fracture or dislocation in left hand. Irregularity of the distal radius and ulna are suggestive for old injuries. Mild degenerative changes in the carpal bones. Overall alignment of the left hand is within normal limits. IMPRESSION: No acute abnormality to left hand. Electronically Signed   By: Richarda Overlie M.D.   On: 07/23/2019 10:34    Assessment/Plan  1. Acute pain of right knee - Cholecalciferol (VITAMIN D3) 50 MCG (2000 UT) capsule; Take 1 capsule (2,000 Units total) by mouth daily. Must keep scheduled follow=-up appt for future refills  Dispense: 30 capsule; Refill: 0 - cyclobenzaprine (FLEXERIL) 10 MG tablet; Take 1 tablet (10 mg total) by mouth 2 (two) times daily.  Dispense: 60 tablet; Refill: 0 - traMADol (ULTRAM) 50 MG tablet; Take 1 tablet (50 mg total) by mouth 2 (two) times daily as needed.  Dispense: 15  tablet; Refill: 0  2. Type 2 diabetes mellitus without complication, without long-term current use of insulin (HCC) Lab Results  Component Value Date   HGBA1C 6.4 05/02/2019   - metFORMIN (GLUCOPHAGE) 500 MG tablet; TAKE 1 TABLET(500 MG) BY MOUTH DAILY.  Dispense: 30 tablet; Refill: 0  3. Hypothyroidism, unspecified type Lab Results  Component Value Date   TSH 7.59 (H) 05/02/2019   - levothyroxine (SYNTHROID) 50 MCG tablet; Take 1 tablet (50 mcg total) by mouth See admin instructions. Take daily except for Sundays  Dispense: 26 tablet; Refill: 0  4. Chronic obstructive pulmonary disease, unspecified COPD type (HCC) - budesonide-formoterol (SYMBICORT) 80-4.5 MCG/ACT inhaler; Inhale 2 puffs into the lungs 2 (two) times daily.  Dispense: 6.9 g; Refill: 0 - cetirizine (ZYRTEC) 10 MG tablet; Take 1 tablet (10 mg total) by mouth daily. FOR ASTHMA  Dispense: 30 tablet; Refill: 0  5. Essential hypertension - losartan (COZAAR) 50 MG tablet; Take 1 tablet (50 mg total) by mouth daily. Must keep scheduled follow=-up appt for future refills  Dispense: 30 tablet; Refill: 0  6. OAB (overactive bladder) - oxybutynin (DITROPAN) 5 MG tablet; Take 1 tablet (5 mg total) by mouth 2 (two) times daily.  Dispense: 60 tablet; Refill: 0  7. Slow transit constipation -   Continue Colace 100 mg 1 capsule twice a day  8. Pruritus - hydrOXYzine (ATARAX/VISTARIL) 10 MG tablet; Take 1 tablet (10 mg total) by mouth 3 (three) times daily as needed.  Dispense: 30 tablet; Refill: 0  9. Hyperlipidemia, unspecified hyperlipidemia type Lab Results  Component Value Date   CHOL 171 02/11/2018   HDL 45.40 02/11/2018   LDLCALC 111 (H) 02/11/2018   TRIG 72.0 02/11/2018   CHOLHDL 4 02/11/2018   - simvastatin (ZOCOR) 40 MG tablet; Take 1 tablet (40 mg total) by mouth daily at 6 PM.  Dispense: 30 tablet; Refill: 0  10. Recurrent UTI - trimethoprim (TRIMPEX) 100 MG tablet; Take 1 tablet (100 mg total) by mouth daily.   Dispense: 30 tablet; Refill: 0     I have filled out patient's discharge paperwork and e-prescribed medications.  Patient will have home health PT and OT.  DME provided:  Wheelchair, Educational psychologist -  patient suffers from right knee pain due to large suprapatellar joint effusion S/P joint aspiration/arthrocentesis which impairs her ability to perform daily activities like toileting, feeding, dressing, grooming and bathing in the home.  A cane or walker will not resolve issue with performing activities of daily living.  A wheelchair will allow patient to safely perform daily activities.  Patient can safely propel the wheelchair in the home and has a caregiver who can provide assistance.   Total discharge time: Greater than 30 minutes Greater than 50% was spent in counseling and coordination of care.  Discharge time involved coordination of the discharge process with social worker, nursing staff and therapy department. Medical justification for home health services/DME verified.   Kenard GowerMonina Medina-Vargas, DNP, MSN, FNP-BC [facility] [phone] (Monday-Friday 8:00 a.m. - 5:00 p.m.) [phone] (after hours)

## 2019-08-06 MED ORDER — HYDROXYZINE HCL 10 MG PO TABS: 10.0000 mg | ORAL_TABLET | Freq: Three times a day (TID) | ORAL | 0 refills | Status: DC | PRN

## 2019-08-06 MED ORDER — SIMVASTATIN 40 MG PO TABS: 40.0000 mg | ORAL_TABLET | Freq: Every day | ORAL | 0 refills | Status: DC

## 2019-08-06 MED ORDER — CYCLOBENZAPRINE HCL 10 MG PO TABS: 10.0000 mg | ORAL_TABLET | Freq: Two times a day (BID) | ORAL | 0 refills | Status: DC

## 2019-08-06 MED ORDER — METFORMIN HCL 500 MG PO TABS: ORAL_TABLET | ORAL | 0 refills | Status: DC

## 2019-08-06 MED ORDER — LEVOTHYROXINE SODIUM 50 MCG PO TABS: 50.0000 ug | ORAL_TABLET | ORAL | 0 refills | Status: DC

## 2019-08-06 MED ORDER — POLYVINYL ALCOHOL 1.4 % OP SOLN: 1.0000 [drp] | OPHTHALMIC | 0 refills | Status: DC | PRN

## 2019-08-06 MED ORDER — TRIMETHOPRIM 100 MG PO TABS: 100.0000 mg | ORAL_TABLET | Freq: Every day | ORAL | 0 refills | Status: DC

## 2019-08-06 MED ORDER — CETIRIZINE HCL 10 MG PO TABS: 10.0000 mg | ORAL_TABLET | Freq: Every day | ORAL | 0 refills | Status: DC

## 2019-08-06 MED ORDER — VITAMIN D3 50 MCG (2000 UT) PO CAPS: 2000.0000 [IU] | ORAL_CAPSULE | Freq: Every day | ORAL | 0 refills | Status: AC

## 2019-08-06 MED ORDER — PANTOPRAZOLE SODIUM 20 MG PO TBEC: 20.0000 mg | DELAYED_RELEASE_TABLET | Freq: Every day | ORAL | 0 refills | Status: DC

## 2019-08-06 MED ORDER — OXYBUTYNIN CHLORIDE 5 MG PO TABS: 5.0000 mg | ORAL_TABLET | Freq: Two times a day (BID) | ORAL | 0 refills | Status: DC

## 2019-08-06 MED ORDER — BUDESONIDE-FORMOTEROL FUMARATE 80-4.5 MCG/ACT IN AERO: 2.0000 | INHALATION_SPRAY | Freq: Two times a day (BID) | RESPIRATORY_TRACT | 0 refills | Status: DC

## 2019-08-06 MED ORDER — TRAMADOL HCL 50 MG PO TABS: 50.0000 mg | ORAL_TABLET | Freq: Two times a day (BID) | ORAL | 0 refills | Status: DC | PRN

## 2019-08-06 MED ORDER — LOSARTAN POTASSIUM 50 MG PO TABS: 50.0000 mg | ORAL_TABLET | Freq: Every day | ORAL | 0 refills | Status: DC

## 2019-08-08 ENCOUNTER — Other Ambulatory Visit: Payer: Self-pay | Admitting: Adult Health

## 2019-08-08 DIAGNOSIS — N3281 Overactive bladder: Secondary | ICD-10-CM

## 2019-08-08 DIAGNOSIS — E119 Type 2 diabetes mellitus without complications: Secondary | ICD-10-CM

## 2019-08-08 DIAGNOSIS — E785 Hyperlipidemia, unspecified: Secondary | ICD-10-CM

## 2019-08-09 ENCOUNTER — Other Ambulatory Visit: Payer: Self-pay | Admitting: Adult Health

## 2019-08-09 MED ORDER — MELOXICAM 7.5 MG PO TABS: 7.5000 mg | ORAL_TABLET | Freq: Every day | ORAL | 0 refills | Status: AC

## 2019-08-10 ENCOUNTER — Telehealth: Payer: Self-pay | Admitting: *Deleted

## 2019-08-10 NOTE — Telephone Encounter (Signed)
Called pt to completed TCM call there was no answer LMOM RTC...Raechel Chute

## 2019-08-10 NOTE — Telephone Encounter (Signed)
Pt return call back she states that facility made the appt for her, but her knee is not all the way heal, and she is very skeptical walking on it, she has no  transporation bcz she can not drive. She states once she feel better she will call back to reschedule.Marland KitchenRaechel Chute

## 2019-08-10 NOTE — Telephone Encounter (Signed)
Rec'd msg from Patient Lindsay [name] on 07/27/19 pt was admitted to [facility]. On 08/09/19 @ 12:30 PM she was discharged from [facility] to home. Will call pt to f/u and make hosp f/u if need.Marland KitchenRaechel Chute

## 2019-08-12 ENCOUNTER — Ambulatory Visit (INDEPENDENT_AMBULATORY_CARE_PROVIDER_SITE_OTHER): Payer: Medicare Other | Admitting: Internal Medicine

## 2019-08-12 ENCOUNTER — Other Ambulatory Visit: Payer: Self-pay

## 2019-08-12 ENCOUNTER — Encounter: Payer: Self-pay | Admitting: Internal Medicine

## 2019-08-12 VITALS — BP 136/72 | HR 76 | Ht 63.0 in | Wt 199.4 lb

## 2019-08-12 DIAGNOSIS — J449 Chronic obstructive pulmonary disease, unspecified: Secondary | ICD-10-CM | POA: Diagnosis not present

## 2019-08-12 DIAGNOSIS — E039 Hypothyroidism, unspecified: Secondary | ICD-10-CM

## 2019-08-12 DIAGNOSIS — K219 Gastro-esophageal reflux disease without esophagitis: Secondary | ICD-10-CM | POA: Diagnosis not present

## 2019-08-12 DIAGNOSIS — Z9181 History of falling: Secondary | ICD-10-CM | POA: Diagnosis not present

## 2019-08-12 DIAGNOSIS — Z7982 Long term (current) use of aspirin: Secondary | ICD-10-CM | POA: Diagnosis not present

## 2019-08-12 DIAGNOSIS — N3281 Overactive bladder: Secondary | ICD-10-CM | POA: Diagnosis not present

## 2019-08-12 DIAGNOSIS — H524 Presbyopia: Secondary | ICD-10-CM | POA: Diagnosis not present

## 2019-08-12 DIAGNOSIS — Z7984 Long term (current) use of oral hypoglycemic drugs: Secondary | ICD-10-CM | POA: Diagnosis not present

## 2019-08-12 DIAGNOSIS — M25461 Effusion, right knee: Secondary | ICD-10-CM | POA: Diagnosis not present

## 2019-08-12 DIAGNOSIS — E119 Type 2 diabetes mellitus without complications: Secondary | ICD-10-CM | POA: Diagnosis not present

## 2019-08-12 DIAGNOSIS — G8911 Acute pain due to trauma: Secondary | ICD-10-CM | POA: Diagnosis not present

## 2019-08-12 DIAGNOSIS — Z87440 Personal history of urinary (tract) infections: Secondary | ICD-10-CM | POA: Diagnosis not present

## 2019-08-12 DIAGNOSIS — Z9049 Acquired absence of other specified parts of digestive tract: Secondary | ICD-10-CM | POA: Diagnosis not present

## 2019-08-12 DIAGNOSIS — M48061 Spinal stenosis, lumbar region without neurogenic claudication: Secondary | ICD-10-CM | POA: Diagnosis not present

## 2019-08-12 DIAGNOSIS — K5901 Slow transit constipation: Secondary | ICD-10-CM | POA: Diagnosis not present

## 2019-08-12 DIAGNOSIS — M25561 Pain in right knee: Secondary | ICD-10-CM | POA: Diagnosis not present

## 2019-08-12 DIAGNOSIS — I10 Essential (primary) hypertension: Secondary | ICD-10-CM | POA: Diagnosis not present

## 2019-08-12 DIAGNOSIS — E785 Hyperlipidemia, unspecified: Secondary | ICD-10-CM | POA: Diagnosis not present

## 2019-08-12 DIAGNOSIS — N390 Urinary tract infection, site not specified: Secondary | ICD-10-CM | POA: Diagnosis not present

## 2019-08-12 LAB — T4, FREE: Free T4: 1.11 ng/dL (ref 0.60–1.60)

## 2019-08-12 LAB — TSH: TSH: 10.23 u[IU]/mL — ABNORMAL HIGH (ref 0.35–4.50)

## 2019-08-12 MED ORDER — LEVOTHYROXINE SODIUM 75 MCG PO TABS
75.0000 ug | ORAL_TABLET | Freq: Every day | ORAL | 3 refills | Status: DC
Start: 2019-08-12 — End: 2020-08-03

## 2019-08-12 NOTE — Progress Notes (Signed)
Name: Lindsay [name]  MRN/ DOB: [id number], 12-05-1945    Age/ Sex: 74 y.o., female     PCP: Olive Bass, FNP   Reason for Endocrinology Evaluation: Hypothyroidism     Initial Endocrinology Clinic Visit: 01/11/2019    PATIENT IDENTIFIER: [name] is a 75 y.o., female with a past medical history of HTN, T2DM and Hypothyroidism. She has followed with [facility] since 01/11/2019 for consultative assistance with management of her hypothyroidism.   HISTORICAL SUMMARY: The patient was diagnosed with hypothyroidism in 12/2018 with a TSH 25.71 uIU/mL . Was started on levothyroxine 50 mcg but developed diarrhea that she attributed to LT-4 replacement and stopped it.  She was encouraged to restart it on her initial visit to our clinic  SUBJECTIVE:   Today (08/12/2019):  Lindsay [name] is here for a follow up on hypothyroidism.   Weight has been stable  Energy level is improved  No  hair loss  Constipation  resolved  No local neck symptoms   Right knee is so and so      HISTORY:  Past Medical History:  Past Medical History:  Diagnosis Date  . Acid reflux   . Asthma   . COPD (chronic obstructive pulmonary disease) (HCC)   . Hyperglycemia   . Hypokalemia   . Obesity   . Spinal stenosis   . Urinary tract infection, chronic    Past Surgical History:  Past Surgical History:  Procedure Laterality Date  . ABDOMINAL HYSTERECTOMY    . ANKLE FUSION Left   . CHOLECYSTECTOMY      Social History:  reports that she quit smoking about 22 years ago. Her smoking use included cigarettes. She has a 10.00 pack-year smoking history. She has never used smokeless tobacco. She reports that she does not drink alcohol and does not use drugs. Family History:  Family History  Problem Relation Age of Onset  . Microcephaly Maternal Grandmother   . Heart disease Maternal Grandmother        Not clear details, patient just knows she took "heart pills"  . Asthma  Daughter      HOME MEDICATIONS: Allergies as of 08/12/2019      Reactions   Codeine Nausea Only   Lisinopril    cough   Morphine And Related Itching   Other Other (See Comments)   Seafood : itching      Medication List       Accurate as of August 12, 2019  4:42 PM. If you have any questions, ask your nurse or doctor.        aspirin EC 81 MG tablet Take 81 mg by mouth daily.   budesonide-formoterol 80-4.5 MCG/ACT inhaler Commonly known as: Symbicort Inhale 2 puffs into the lungs 2 (two) times daily.   cetirizine 10 MG tablet Commonly known as: ZYRTEC Take 1 tablet (10 mg total) by mouth daily. FOR ASTHMA   cyclobenzaprine 10 MG tablet Commonly known as: FLEXERIL Take 1 tablet (10 mg total) by mouth 2 (two) times daily.   docusate sodium 100 MG capsule Commonly known as: COLACE Take 100 mg by mouth 2 (two) times daily.   hydrOXYzine 10 MG tablet Commonly known as: ATARAX/VISTARIL Take 1 tablet (10 mg total) by mouth 3 (three) times daily as needed.   levothyroxine 50 MCG tablet Commonly known as: SYNTHROID Take 1 tablet (50 mcg total) by mouth See admin instructions. Take daily except for Sundays   losartan 50 MG tablet Commonly  known as: COZAAR Take 1 tablet (50 mg total) by mouth daily. Must keep scheduled follow=-up appt for future refills   meloxicam 7.5 MG tablet Commonly known as: Mobic Take 1 tablet (7.5 mg total) by mouth daily for 5 days.   metFORMIN 500 MG tablet Commonly known as: GLUCOPHAGE TAKE 1 TABLET(500 MG) BY MOUTH DAILY.   NON FORMULARY DIET- REGULAR CCD   oxybutynin 5 MG tablet Commonly known as: DITROPAN Take 1 tablet (5 mg total) by mouth 2 (two) times daily.   pantoprazole 20 MG tablet Commonly known as: PROTONIX Take 1 tablet (20 mg total) by mouth daily.   polyvinyl alcohol 1.4 % ophthalmic solution Commonly known as: LIQUIFILM TEARS Place 1 drop into both eyes as needed for dry eyes.   simvastatin 40 MG tablet Commonly  known as: ZOCOR Take 1 tablet (40 mg total) by mouth daily at 6 PM.   traMADol 50 MG tablet Commonly known as: ULTRAM Take 1 tablet (50 mg total) by mouth 2 (two) times daily as needed.   trimethoprim 100 MG tablet Commonly known as: TRIMPEX Take 1 tablet (100 mg total) by mouth daily.   Vitamin D3 50 MCG (2000 UT) capsule Take 1 capsule (2,000 Units total) by mouth daily. Must keep scheduled follow=-up appt for future refills         OBJECTIVE:   PHYSICAL EXAM: VS: BP 136/72   Pulse 76   Ht 5\' 3"  (1.6 m)   Wt 199 lb 6.4 oz (90.4 kg)   SpO2 98%   BMI 35.32 kg/m    EXAM: General: Pt appears well and is in NAD  Neck: General: Supple without adenopathy. Thyroid: Thyroid size normal.  No goiter or nodules appreciated. No thyroid bruit.  Lungs: Clear with good BS bilat with no rales, rhonchi, or wheezes  Heart: Auscultation: RRR.  Extremities:  BL LE: No pretibial edema normal ROM and strength.  Mental Status: Judgment, insight: Intact Mood and affect: No depression, anxiety, or agitation     DATA REVIEWED:  Results for Lindsay [name], Lindsay [name] (MRN Athena Masse) as of 08/12/2019 16:42  Ref. Range 05/02/2019 13:27 08/12/2019 10:25  TSH Latest Ref Range: 0.35 - 4.50 uIU/mL 7.59 (H) 10.23 (H)  T4,Free(Direct) Latest Ref Range: 0.60 - 1.60 ng/dL  10/12/2019    ASSESSMENT / PLAN / RECOMMENDATIONS:   1. Hypothyroidism:   - Pt is clinically euthyroid  - No local neck symptoms - TSH continues to be elevated, will adjust the dose as below    Medications   Stop Levothyroxine 50 mcg Start levothyroxine 75 mcg daily       F/u in 6 months  Labs in 8 weeks     Addendum: Left a message on voice mail to pick up new prescription on 08/12/2019  Signed electronically by: 10/12/2019, MD  [facility]  [facility] [address] [place] [place], [place] Phone: [phone] FAX: [phone]      CC: [phone],  FNP [address] [place] Phone: [phone]  Fax: [phone]   Return to Endocrinology clinic as below: Future Appointments  Date Time Provider Department Center  10/07/2019  9:30 AM [facility] LAB [facility] None  02/13/2020  9:30 AM Lucero Auzenne, 04/12/2020, MD [facility] None

## 2019-08-12 NOTE — Patient Instructions (Signed)

## 2019-08-15 ENCOUNTER — Inpatient Hospital Stay: Payer: Medicare Other | Admitting: Family

## 2019-08-16 DIAGNOSIS — K219 Gastro-esophageal reflux disease without esophagitis: Secondary | ICD-10-CM | POA: Diagnosis not present

## 2019-08-16 DIAGNOSIS — Z7982 Long term (current) use of aspirin: Secondary | ICD-10-CM | POA: Diagnosis not present

## 2019-08-16 DIAGNOSIS — Z87440 Personal history of urinary (tract) infections: Secondary | ICD-10-CM | POA: Diagnosis not present

## 2019-08-16 DIAGNOSIS — M25561 Pain in right knee: Secondary | ICD-10-CM | POA: Diagnosis not present

## 2019-08-16 DIAGNOSIS — I10 Essential (primary) hypertension: Secondary | ICD-10-CM | POA: Diagnosis not present

## 2019-08-16 DIAGNOSIS — E039 Hypothyroidism, unspecified: Secondary | ICD-10-CM | POA: Diagnosis not present

## 2019-08-16 DIAGNOSIS — Z9181 History of falling: Secondary | ICD-10-CM | POA: Diagnosis not present

## 2019-08-16 DIAGNOSIS — G8911 Acute pain due to trauma: Secondary | ICD-10-CM | POA: Diagnosis not present

## 2019-08-16 DIAGNOSIS — N3281 Overactive bladder: Secondary | ICD-10-CM | POA: Diagnosis not present

## 2019-08-16 DIAGNOSIS — E785 Hyperlipidemia, unspecified: Secondary | ICD-10-CM | POA: Diagnosis not present

## 2019-08-16 DIAGNOSIS — Z9049 Acquired absence of other specified parts of digestive tract: Secondary | ICD-10-CM | POA: Diagnosis not present

## 2019-08-16 DIAGNOSIS — J449 Chronic obstructive pulmonary disease, unspecified: Secondary | ICD-10-CM | POA: Diagnosis not present

## 2019-08-16 DIAGNOSIS — M48061 Spinal stenosis, lumbar region without neurogenic claudication: Secondary | ICD-10-CM | POA: Diagnosis not present

## 2019-08-16 DIAGNOSIS — M25461 Effusion, right knee: Secondary | ICD-10-CM | POA: Diagnosis not present

## 2019-08-16 DIAGNOSIS — E119 Type 2 diabetes mellitus without complications: Secondary | ICD-10-CM | POA: Diagnosis not present

## 2019-08-16 DIAGNOSIS — K5901 Slow transit constipation: Secondary | ICD-10-CM | POA: Diagnosis not present

## 2019-08-16 DIAGNOSIS — N390 Urinary tract infection, site not specified: Secondary | ICD-10-CM | POA: Diagnosis not present

## 2019-08-16 DIAGNOSIS — Z7984 Long term (current) use of oral hypoglycemic drugs: Secondary | ICD-10-CM | POA: Diagnosis not present

## 2019-08-17 DIAGNOSIS — J449 Chronic obstructive pulmonary disease, unspecified: Secondary | ICD-10-CM | POA: Diagnosis not present

## 2019-08-17 DIAGNOSIS — E119 Type 2 diabetes mellitus without complications: Secondary | ICD-10-CM | POA: Diagnosis not present

## 2019-08-17 DIAGNOSIS — Z87440 Personal history of urinary (tract) infections: Secondary | ICD-10-CM | POA: Diagnosis not present

## 2019-08-17 DIAGNOSIS — Z9181 History of falling: Secondary | ICD-10-CM | POA: Diagnosis not present

## 2019-08-17 DIAGNOSIS — M25561 Pain in right knee: Secondary | ICD-10-CM | POA: Diagnosis not present

## 2019-08-17 DIAGNOSIS — I10 Essential (primary) hypertension: Secondary | ICD-10-CM | POA: Diagnosis not present

## 2019-08-17 DIAGNOSIS — Z9049 Acquired absence of other specified parts of digestive tract: Secondary | ICD-10-CM | POA: Diagnosis not present

## 2019-08-17 DIAGNOSIS — M48061 Spinal stenosis, lumbar region without neurogenic claudication: Secondary | ICD-10-CM | POA: Diagnosis not present

## 2019-08-17 DIAGNOSIS — N3281 Overactive bladder: Secondary | ICD-10-CM | POA: Diagnosis not present

## 2019-08-17 DIAGNOSIS — K5901 Slow transit constipation: Secondary | ICD-10-CM | POA: Diagnosis not present

## 2019-08-17 DIAGNOSIS — M25532 Pain in left wrist: Secondary | ICD-10-CM | POA: Diagnosis not present

## 2019-08-17 DIAGNOSIS — Z7982 Long term (current) use of aspirin: Secondary | ICD-10-CM | POA: Diagnosis not present

## 2019-08-17 DIAGNOSIS — G8911 Acute pain due to trauma: Secondary | ICD-10-CM | POA: Diagnosis not present

## 2019-08-17 DIAGNOSIS — E785 Hyperlipidemia, unspecified: Secondary | ICD-10-CM | POA: Diagnosis not present

## 2019-08-17 DIAGNOSIS — N390 Urinary tract infection, site not specified: Secondary | ICD-10-CM | POA: Diagnosis not present

## 2019-08-17 DIAGNOSIS — M1711 Unilateral primary osteoarthritis, right knee: Secondary | ICD-10-CM | POA: Diagnosis not present

## 2019-08-17 DIAGNOSIS — M25461 Effusion, right knee: Secondary | ICD-10-CM | POA: Diagnosis not present

## 2019-08-17 DIAGNOSIS — Z7984 Long term (current) use of oral hypoglycemic drugs: Secondary | ICD-10-CM | POA: Diagnosis not present

## 2019-08-17 DIAGNOSIS — K219 Gastro-esophageal reflux disease without esophagitis: Secondary | ICD-10-CM | POA: Diagnosis not present

## 2019-08-17 DIAGNOSIS — E039 Hypothyroidism, unspecified: Secondary | ICD-10-CM | POA: Diagnosis not present

## 2019-08-18 DIAGNOSIS — Z7982 Long term (current) use of aspirin: Secondary | ICD-10-CM | POA: Diagnosis not present

## 2019-08-18 DIAGNOSIS — Z7984 Long term (current) use of oral hypoglycemic drugs: Secondary | ICD-10-CM | POA: Diagnosis not present

## 2019-08-18 DIAGNOSIS — Z9049 Acquired absence of other specified parts of digestive tract: Secondary | ICD-10-CM | POA: Diagnosis not present

## 2019-08-18 DIAGNOSIS — N3281 Overactive bladder: Secondary | ICD-10-CM | POA: Diagnosis not present

## 2019-08-18 DIAGNOSIS — E785 Hyperlipidemia, unspecified: Secondary | ICD-10-CM | POA: Diagnosis not present

## 2019-08-18 DIAGNOSIS — K5901 Slow transit constipation: Secondary | ICD-10-CM | POA: Diagnosis not present

## 2019-08-18 DIAGNOSIS — M25461 Effusion, right knee: Secondary | ICD-10-CM | POA: Diagnosis not present

## 2019-08-18 DIAGNOSIS — J449 Chronic obstructive pulmonary disease, unspecified: Secondary | ICD-10-CM | POA: Diagnosis not present

## 2019-08-18 DIAGNOSIS — Z9181 History of falling: Secondary | ICD-10-CM | POA: Diagnosis not present

## 2019-08-18 DIAGNOSIS — N390 Urinary tract infection, site not specified: Secondary | ICD-10-CM | POA: Diagnosis not present

## 2019-08-18 DIAGNOSIS — Z87440 Personal history of urinary (tract) infections: Secondary | ICD-10-CM | POA: Diagnosis not present

## 2019-08-18 DIAGNOSIS — G8911 Acute pain due to trauma: Secondary | ICD-10-CM | POA: Diagnosis not present

## 2019-08-18 DIAGNOSIS — M25561 Pain in right knee: Secondary | ICD-10-CM | POA: Diagnosis not present

## 2019-08-18 DIAGNOSIS — M48061 Spinal stenosis, lumbar region without neurogenic claudication: Secondary | ICD-10-CM | POA: Diagnosis not present

## 2019-08-18 DIAGNOSIS — K219 Gastro-esophageal reflux disease without esophagitis: Secondary | ICD-10-CM | POA: Diagnosis not present

## 2019-08-18 DIAGNOSIS — I10 Essential (primary) hypertension: Secondary | ICD-10-CM | POA: Diagnosis not present

## 2019-08-18 DIAGNOSIS — E119 Type 2 diabetes mellitus without complications: Secondary | ICD-10-CM | POA: Diagnosis not present

## 2019-08-18 DIAGNOSIS — E039 Hypothyroidism, unspecified: Secondary | ICD-10-CM | POA: Diagnosis not present

## 2019-08-19 DIAGNOSIS — E039 Hypothyroidism, unspecified: Secondary | ICD-10-CM

## 2019-08-19 DIAGNOSIS — E785 Hyperlipidemia, unspecified: Secondary | ICD-10-CM

## 2019-08-19 DIAGNOSIS — K219 Gastro-esophageal reflux disease without esophagitis: Secondary | ICD-10-CM

## 2019-08-19 DIAGNOSIS — M25461 Effusion, right knee: Secondary | ICD-10-CM | POA: Diagnosis not present

## 2019-08-19 DIAGNOSIS — E119 Type 2 diabetes mellitus without complications: Secondary | ICD-10-CM

## 2019-08-19 DIAGNOSIS — N3281 Overactive bladder: Secondary | ICD-10-CM

## 2019-08-19 DIAGNOSIS — N390 Urinary tract infection, site not specified: Secondary | ICD-10-CM | POA: Diagnosis not present

## 2019-08-19 DIAGNOSIS — R41841 Cognitive communication deficit: Secondary | ICD-10-CM

## 2019-08-19 DIAGNOSIS — M48061 Spinal stenosis, lumbar region without neurogenic claudication: Secondary | ICD-10-CM | POA: Diagnosis not present

## 2019-08-19 DIAGNOSIS — Z87440 Personal history of urinary (tract) infections: Secondary | ICD-10-CM

## 2019-08-19 DIAGNOSIS — M25561 Pain in right knee: Secondary | ICD-10-CM | POA: Diagnosis not present

## 2019-08-19 DIAGNOSIS — I10 Essential (primary) hypertension: Secondary | ICD-10-CM

## 2019-08-19 DIAGNOSIS — K5901 Slow transit constipation: Secondary | ICD-10-CM

## 2019-08-19 DIAGNOSIS — Z9049 Acquired absence of other specified parts of digestive tract: Secondary | ICD-10-CM

## 2019-08-19 DIAGNOSIS — Z7982 Long term (current) use of aspirin: Secondary | ICD-10-CM

## 2019-08-19 DIAGNOSIS — Z9181 History of falling: Secondary | ICD-10-CM

## 2019-08-19 DIAGNOSIS — G8911 Acute pain due to trauma: Secondary | ICD-10-CM | POA: Diagnosis not present

## 2019-08-19 DIAGNOSIS — E669 Obesity, unspecified: Secondary | ICD-10-CM

## 2019-08-19 DIAGNOSIS — Z7984 Long term (current) use of oral hypoglycemic drugs: Secondary | ICD-10-CM

## 2019-08-19 DIAGNOSIS — J449 Chronic obstructive pulmonary disease, unspecified: Secondary | ICD-10-CM

## 2019-08-22 DIAGNOSIS — N3281 Overactive bladder: Secondary | ICD-10-CM | POA: Diagnosis not present

## 2019-08-22 DIAGNOSIS — M48061 Spinal stenosis, lumbar region without neurogenic claudication: Secondary | ICD-10-CM | POA: Diagnosis not present

## 2019-08-22 DIAGNOSIS — Z7982 Long term (current) use of aspirin: Secondary | ICD-10-CM | POA: Diagnosis not present

## 2019-08-22 DIAGNOSIS — Z9049 Acquired absence of other specified parts of digestive tract: Secondary | ICD-10-CM | POA: Diagnosis not present

## 2019-08-22 DIAGNOSIS — Z7984 Long term (current) use of oral hypoglycemic drugs: Secondary | ICD-10-CM | POA: Diagnosis not present

## 2019-08-22 DIAGNOSIS — E039 Hypothyroidism, unspecified: Secondary | ICD-10-CM | POA: Diagnosis not present

## 2019-08-22 DIAGNOSIS — Z87440 Personal history of urinary (tract) infections: Secondary | ICD-10-CM | POA: Diagnosis not present

## 2019-08-22 DIAGNOSIS — M25461 Effusion, right knee: Secondary | ICD-10-CM | POA: Diagnosis not present

## 2019-08-22 DIAGNOSIS — N390 Urinary tract infection, site not specified: Secondary | ICD-10-CM | POA: Diagnosis not present

## 2019-08-22 DIAGNOSIS — K5901 Slow transit constipation: Secondary | ICD-10-CM | POA: Diagnosis not present

## 2019-08-22 DIAGNOSIS — Z9181 History of falling: Secondary | ICD-10-CM | POA: Diagnosis not present

## 2019-08-22 DIAGNOSIS — J449 Chronic obstructive pulmonary disease, unspecified: Secondary | ICD-10-CM | POA: Diagnosis not present

## 2019-08-22 DIAGNOSIS — K219 Gastro-esophageal reflux disease without esophagitis: Secondary | ICD-10-CM | POA: Diagnosis not present

## 2019-08-22 DIAGNOSIS — M25561 Pain in right knee: Secondary | ICD-10-CM | POA: Diagnosis not present

## 2019-08-22 DIAGNOSIS — G8911 Acute pain due to trauma: Secondary | ICD-10-CM | POA: Diagnosis not present

## 2019-08-22 DIAGNOSIS — E119 Type 2 diabetes mellitus without complications: Secondary | ICD-10-CM | POA: Diagnosis not present

## 2019-08-22 DIAGNOSIS — I10 Essential (primary) hypertension: Secondary | ICD-10-CM | POA: Diagnosis not present

## 2019-08-22 DIAGNOSIS — E785 Hyperlipidemia, unspecified: Secondary | ICD-10-CM | POA: Diagnosis not present

## 2019-08-24 DIAGNOSIS — M25461 Effusion, right knee: Secondary | ICD-10-CM | POA: Diagnosis not present

## 2019-08-24 DIAGNOSIS — J449 Chronic obstructive pulmonary disease, unspecified: Secondary | ICD-10-CM | POA: Diagnosis not present

## 2019-08-24 DIAGNOSIS — E119 Type 2 diabetes mellitus without complications: Secondary | ICD-10-CM | POA: Diagnosis not present

## 2019-08-24 DIAGNOSIS — Z7984 Long term (current) use of oral hypoglycemic drugs: Secondary | ICD-10-CM | POA: Diagnosis not present

## 2019-08-24 DIAGNOSIS — Z9181 History of falling: Secondary | ICD-10-CM | POA: Diagnosis not present

## 2019-08-24 DIAGNOSIS — Z87440 Personal history of urinary (tract) infections: Secondary | ICD-10-CM | POA: Diagnosis not present

## 2019-08-24 DIAGNOSIS — M48061 Spinal stenosis, lumbar region without neurogenic claudication: Secondary | ICD-10-CM | POA: Diagnosis not present

## 2019-08-24 DIAGNOSIS — K5901 Slow transit constipation: Secondary | ICD-10-CM | POA: Diagnosis not present

## 2019-08-24 DIAGNOSIS — Z7982 Long term (current) use of aspirin: Secondary | ICD-10-CM | POA: Diagnosis not present

## 2019-08-24 DIAGNOSIS — E039 Hypothyroidism, unspecified: Secondary | ICD-10-CM | POA: Diagnosis not present

## 2019-08-24 DIAGNOSIS — I10 Essential (primary) hypertension: Secondary | ICD-10-CM | POA: Diagnosis not present

## 2019-08-24 DIAGNOSIS — E785 Hyperlipidemia, unspecified: Secondary | ICD-10-CM | POA: Diagnosis not present

## 2019-08-24 DIAGNOSIS — Z9049 Acquired absence of other specified parts of digestive tract: Secondary | ICD-10-CM | POA: Diagnosis not present

## 2019-08-24 DIAGNOSIS — K219 Gastro-esophageal reflux disease without esophagitis: Secondary | ICD-10-CM | POA: Diagnosis not present

## 2019-08-24 DIAGNOSIS — N390 Urinary tract infection, site not specified: Secondary | ICD-10-CM | POA: Diagnosis not present

## 2019-08-24 DIAGNOSIS — M25561 Pain in right knee: Secondary | ICD-10-CM | POA: Diagnosis not present

## 2019-08-24 DIAGNOSIS — N3281 Overactive bladder: Secondary | ICD-10-CM | POA: Diagnosis not present

## 2019-08-24 DIAGNOSIS — G8911 Acute pain due to trauma: Secondary | ICD-10-CM | POA: Diagnosis not present

## 2019-08-25 DIAGNOSIS — M25561 Pain in right knee: Secondary | ICD-10-CM | POA: Diagnosis not present

## 2019-08-26 DIAGNOSIS — M25561 Pain in right knee: Secondary | ICD-10-CM | POA: Diagnosis not present

## 2019-08-26 DIAGNOSIS — M48061 Spinal stenosis, lumbar region without neurogenic claudication: Secondary | ICD-10-CM | POA: Diagnosis not present

## 2019-08-26 DIAGNOSIS — Z9181 History of falling: Secondary | ICD-10-CM | POA: Diagnosis not present

## 2019-08-26 DIAGNOSIS — Z9049 Acquired absence of other specified parts of digestive tract: Secondary | ICD-10-CM | POA: Diagnosis not present

## 2019-08-26 DIAGNOSIS — E119 Type 2 diabetes mellitus without complications: Secondary | ICD-10-CM | POA: Diagnosis not present

## 2019-08-26 DIAGNOSIS — E785 Hyperlipidemia, unspecified: Secondary | ICD-10-CM | POA: Diagnosis not present

## 2019-08-26 DIAGNOSIS — Z87440 Personal history of urinary (tract) infections: Secondary | ICD-10-CM | POA: Diagnosis not present

## 2019-08-26 DIAGNOSIS — G8911 Acute pain due to trauma: Secondary | ICD-10-CM | POA: Diagnosis not present

## 2019-08-26 DIAGNOSIS — K5901 Slow transit constipation: Secondary | ICD-10-CM | POA: Diagnosis not present

## 2019-08-26 DIAGNOSIS — Z7982 Long term (current) use of aspirin: Secondary | ICD-10-CM | POA: Diagnosis not present

## 2019-08-26 DIAGNOSIS — K219 Gastro-esophageal reflux disease without esophagitis: Secondary | ICD-10-CM | POA: Diagnosis not present

## 2019-08-26 DIAGNOSIS — I10 Essential (primary) hypertension: Secondary | ICD-10-CM | POA: Diagnosis not present

## 2019-08-26 DIAGNOSIS — Z7984 Long term (current) use of oral hypoglycemic drugs: Secondary | ICD-10-CM | POA: Diagnosis not present

## 2019-08-26 DIAGNOSIS — N3281 Overactive bladder: Secondary | ICD-10-CM | POA: Diagnosis not present

## 2019-08-26 DIAGNOSIS — J449 Chronic obstructive pulmonary disease, unspecified: Secondary | ICD-10-CM | POA: Diagnosis not present

## 2019-08-26 DIAGNOSIS — M25461 Effusion, right knee: Secondary | ICD-10-CM | POA: Diagnosis not present

## 2019-08-26 DIAGNOSIS — E039 Hypothyroidism, unspecified: Secondary | ICD-10-CM | POA: Diagnosis not present

## 2019-08-26 DIAGNOSIS — N390 Urinary tract infection, site not specified: Secondary | ICD-10-CM | POA: Diagnosis not present

## 2019-08-29 DIAGNOSIS — K219 Gastro-esophageal reflux disease without esophagitis: Secondary | ICD-10-CM | POA: Diagnosis not present

## 2019-08-29 DIAGNOSIS — Z9181 History of falling: Secondary | ICD-10-CM | POA: Diagnosis not present

## 2019-08-29 DIAGNOSIS — M25561 Pain in right knee: Secondary | ICD-10-CM | POA: Diagnosis not present

## 2019-08-29 DIAGNOSIS — N390 Urinary tract infection, site not specified: Secondary | ICD-10-CM | POA: Diagnosis not present

## 2019-08-29 DIAGNOSIS — K5901 Slow transit constipation: Secondary | ICD-10-CM | POA: Diagnosis not present

## 2019-08-29 DIAGNOSIS — E119 Type 2 diabetes mellitus without complications: Secondary | ICD-10-CM | POA: Diagnosis not present

## 2019-08-29 DIAGNOSIS — I10 Essential (primary) hypertension: Secondary | ICD-10-CM | POA: Diagnosis not present

## 2019-08-29 DIAGNOSIS — E785 Hyperlipidemia, unspecified: Secondary | ICD-10-CM | POA: Diagnosis not present

## 2019-08-29 DIAGNOSIS — Z7984 Long term (current) use of oral hypoglycemic drugs: Secondary | ICD-10-CM | POA: Diagnosis not present

## 2019-08-29 DIAGNOSIS — N3281 Overactive bladder: Secondary | ICD-10-CM | POA: Diagnosis not present

## 2019-08-29 DIAGNOSIS — E039 Hypothyroidism, unspecified: Secondary | ICD-10-CM | POA: Diagnosis not present

## 2019-08-29 DIAGNOSIS — Z9049 Acquired absence of other specified parts of digestive tract: Secondary | ICD-10-CM | POA: Diagnosis not present

## 2019-08-29 DIAGNOSIS — Z7982 Long term (current) use of aspirin: Secondary | ICD-10-CM | POA: Diagnosis not present

## 2019-08-29 DIAGNOSIS — M48061 Spinal stenosis, lumbar region without neurogenic claudication: Secondary | ICD-10-CM | POA: Diagnosis not present

## 2019-08-29 DIAGNOSIS — M25461 Effusion, right knee: Secondary | ICD-10-CM | POA: Diagnosis not present

## 2019-08-29 DIAGNOSIS — Z87440 Personal history of urinary (tract) infections: Secondary | ICD-10-CM | POA: Diagnosis not present

## 2019-08-29 DIAGNOSIS — J449 Chronic obstructive pulmonary disease, unspecified: Secondary | ICD-10-CM | POA: Diagnosis not present

## 2019-08-29 DIAGNOSIS — G8911 Acute pain due to trauma: Secondary | ICD-10-CM | POA: Diagnosis not present

## 2019-08-30 DIAGNOSIS — K219 Gastro-esophageal reflux disease without esophagitis: Secondary | ICD-10-CM | POA: Diagnosis not present

## 2019-08-30 DIAGNOSIS — G8911 Acute pain due to trauma: Secondary | ICD-10-CM | POA: Diagnosis not present

## 2019-08-30 DIAGNOSIS — E039 Hypothyroidism, unspecified: Secondary | ICD-10-CM | POA: Diagnosis not present

## 2019-08-30 DIAGNOSIS — E119 Type 2 diabetes mellitus without complications: Secondary | ICD-10-CM | POA: Diagnosis not present

## 2019-08-30 DIAGNOSIS — Z9181 History of falling: Secondary | ICD-10-CM | POA: Diagnosis not present

## 2019-08-30 DIAGNOSIS — I10 Essential (primary) hypertension: Secondary | ICD-10-CM | POA: Diagnosis not present

## 2019-08-30 DIAGNOSIS — N390 Urinary tract infection, site not specified: Secondary | ICD-10-CM | POA: Diagnosis not present

## 2019-08-30 DIAGNOSIS — E785 Hyperlipidemia, unspecified: Secondary | ICD-10-CM | POA: Diagnosis not present

## 2019-08-30 DIAGNOSIS — Z9049 Acquired absence of other specified parts of digestive tract: Secondary | ICD-10-CM | POA: Diagnosis not present

## 2019-08-30 DIAGNOSIS — N3281 Overactive bladder: Secondary | ICD-10-CM | POA: Diagnosis not present

## 2019-08-30 DIAGNOSIS — M48061 Spinal stenosis, lumbar region without neurogenic claudication: Secondary | ICD-10-CM | POA: Diagnosis not present

## 2019-08-30 DIAGNOSIS — M79604 Pain in right leg: Secondary | ICD-10-CM | POA: Diagnosis not present

## 2019-08-30 DIAGNOSIS — R2241 Localized swelling, mass and lump, right lower limb: Secondary | ICD-10-CM | POA: Diagnosis not present

## 2019-08-30 DIAGNOSIS — K5901 Slow transit constipation: Secondary | ICD-10-CM | POA: Diagnosis not present

## 2019-08-30 DIAGNOSIS — J449 Chronic obstructive pulmonary disease, unspecified: Secondary | ICD-10-CM | POA: Diagnosis not present

## 2019-08-30 DIAGNOSIS — Z7984 Long term (current) use of oral hypoglycemic drugs: Secondary | ICD-10-CM | POA: Diagnosis not present

## 2019-08-30 DIAGNOSIS — Z87440 Personal history of urinary (tract) infections: Secondary | ICD-10-CM | POA: Diagnosis not present

## 2019-08-30 DIAGNOSIS — Z7982 Long term (current) use of aspirin: Secondary | ICD-10-CM | POA: Diagnosis not present

## 2019-08-30 DIAGNOSIS — M25461 Effusion, right knee: Secondary | ICD-10-CM | POA: Diagnosis not present

## 2019-08-30 DIAGNOSIS — M25561 Pain in right knee: Secondary | ICD-10-CM | POA: Diagnosis not present

## 2019-09-01 DIAGNOSIS — G8911 Acute pain due to trauma: Secondary | ICD-10-CM | POA: Diagnosis not present

## 2019-09-01 DIAGNOSIS — I10 Essential (primary) hypertension: Secondary | ICD-10-CM | POA: Diagnosis not present

## 2019-09-01 DIAGNOSIS — Z4789 Encounter for other orthopedic aftercare: Secondary | ICD-10-CM | POA: Diagnosis not present

## 2019-09-01 DIAGNOSIS — N3281 Overactive bladder: Secondary | ICD-10-CM | POA: Diagnosis not present

## 2019-09-01 DIAGNOSIS — M6281 Muscle weakness (generalized): Secondary | ICD-10-CM | POA: Diagnosis not present

## 2019-09-01 DIAGNOSIS — Z9181 History of falling: Secondary | ICD-10-CM | POA: Diagnosis not present

## 2019-09-01 DIAGNOSIS — E119 Type 2 diabetes mellitus without complications: Secondary | ICD-10-CM | POA: Diagnosis not present

## 2019-09-01 DIAGNOSIS — M25461 Effusion, right knee: Secondary | ICD-10-CM | POA: Diagnosis not present

## 2019-09-01 DIAGNOSIS — Z7982 Long term (current) use of aspirin: Secondary | ICD-10-CM | POA: Diagnosis not present

## 2019-09-01 DIAGNOSIS — Z87440 Personal history of urinary (tract) infections: Secondary | ICD-10-CM | POA: Diagnosis not present

## 2019-09-01 DIAGNOSIS — E785 Hyperlipidemia, unspecified: Secondary | ICD-10-CM | POA: Diagnosis not present

## 2019-09-01 DIAGNOSIS — K219 Gastro-esophageal reflux disease without esophagitis: Secondary | ICD-10-CM | POA: Diagnosis not present

## 2019-09-01 DIAGNOSIS — N390 Urinary tract infection, site not specified: Secondary | ICD-10-CM | POA: Diagnosis not present

## 2019-09-01 DIAGNOSIS — J449 Chronic obstructive pulmonary disease, unspecified: Secondary | ICD-10-CM | POA: Diagnosis not present

## 2019-09-01 DIAGNOSIS — K5901 Slow transit constipation: Secondary | ICD-10-CM | POA: Diagnosis not present

## 2019-09-01 DIAGNOSIS — Z9049 Acquired absence of other specified parts of digestive tract: Secondary | ICD-10-CM | POA: Diagnosis not present

## 2019-09-01 DIAGNOSIS — E039 Hypothyroidism, unspecified: Secondary | ICD-10-CM | POA: Diagnosis not present

## 2019-09-01 DIAGNOSIS — M48061 Spinal stenosis, lumbar region without neurogenic claudication: Secondary | ICD-10-CM | POA: Diagnosis not present

## 2019-09-01 DIAGNOSIS — M25561 Pain in right knee: Secondary | ICD-10-CM | POA: Diagnosis not present

## 2019-09-01 DIAGNOSIS — Z7984 Long term (current) use of oral hypoglycemic drugs: Secondary | ICD-10-CM | POA: Diagnosis not present

## 2019-09-02 ENCOUNTER — Ambulatory Visit (INDEPENDENT_AMBULATORY_CARE_PROVIDER_SITE_OTHER): Payer: Medicare Other

## 2019-09-02 ENCOUNTER — Other Ambulatory Visit: Payer: Self-pay

## 2019-09-02 ENCOUNTER — Ambulatory Visit (INDEPENDENT_AMBULATORY_CARE_PROVIDER_SITE_OTHER): Payer: Medicare Other | Admitting: Family

## 2019-09-02 VITALS — BP 126/70 | HR 88 | Temp 98.2°F | Ht 63.0 in | Wt 194.0 lb

## 2019-09-02 DIAGNOSIS — D649 Anemia, unspecified: Secondary | ICD-10-CM | POA: Diagnosis not present

## 2019-09-02 DIAGNOSIS — E119 Type 2 diabetes mellitus without complications: Secondary | ICD-10-CM | POA: Diagnosis not present

## 2019-09-02 DIAGNOSIS — I10 Essential (primary) hypertension: Secondary | ICD-10-CM

## 2019-09-02 DIAGNOSIS — Z0000 Encounter for general adult medical examination without abnormal findings: Secondary | ICD-10-CM

## 2019-09-02 DIAGNOSIS — J449 Chronic obstructive pulmonary disease, unspecified: Secondary | ICD-10-CM

## 2019-09-02 MED ORDER — METFORMIN HCL 500 MG PO TABS: ORAL_TABLET | ORAL | 0 refills | Status: DC

## 2019-09-02 NOTE — Addendum Note (Signed)
Addended by: Merrilyn Puma on: 09/02/2019 02:10 PM   Modules accepted: Orders

## 2019-09-02 NOTE — Patient Instructions (Addendum)
[name] , Thank you for taking time to come for your Medicare Wellness Visit. I appreciate your ongoing commitment to your health goals. Please review the following plan we discussed and let me know if I can assist you in the future.   Screening recommendations/referrals: Colonoscopy: 10/01/2018; due every 3 years (Cologuard) Mammogram: 11/24/2018 Bone Density: never done Recommended yearly ophthalmology/optometry visit for glaucoma screening and checkup Recommended yearly dental visit for hygiene and checkup  Vaccinations: Influenza vaccine: 09/09/2018 Pneumococcal vaccine: Pneumovax23 completed; need Prevnar13 Tdap vaccine: never done Shingles vaccine: 11/03/2016   Covid-19: completed  Advanced directives: Please bring a copy of your health care power of attorney and living will to the office at your convenience.  Conditions/risks identified: Yes. Reviewed health maintenance screenings with patient today and relevant education, vaccines, and/or referrals were provided. Continue doing brain stimulating activities (puzzles, reading, adult coloring books, staying active) to keep memory sharp. Continue to eat heart healthy diet (full of fruits, vegetables, whole grains, lean protein, water--limit salt, fat, and sugar intake) and increase physical activity as tolerated.  Next appointment: Please schedule your next Medicare Wellness Visit with your Nurse Health Advisor in 1 year.   Preventive Care 41 Years and Older, Female Preventive care refers to lifestyle choices and visits with your health care provider that can promote health and wellness. What does preventive care include?  A yearly physical exam. This is also called an annual well check.  Dental exams once or twice a year.  Routine eye exams. Ask your health care provider how often you should have your eyes checked.  Personal lifestyle choices, including:  Daily care of your teeth and gums.  Regular physical  activity.  Eating a healthy diet.  Avoiding tobacco and drug use.  Limiting alcohol use.  Practicing safe sex.  Taking low-dose aspirin every day.  Taking vitamin and mineral supplements as recommended by your health care provider. What happens during an annual well check? The services and screenings done by your health care provider during your annual well check will depend on your age, overall health, lifestyle risk factors, and family history of disease. Counseling  Your health care provider may ask you questions about your:  Alcohol use.  Tobacco use.  Drug use.  Emotional well-being.  Home and relationship well-being.  Sexual activity.  Eating habits.  History of falls.  Memory and ability to understand (cognition).  Work and work Astronomer.  Reproductive health. Screening  You may have the following tests or measurements:  Height, weight, and BMI.  Blood pressure.  Lipid and cholesterol levels. These may be checked every 5 years, or more frequently if you are over 21 years old.  Skin check.  Lung cancer screening. You may have this screening every year starting at age 15 if you have a 30-pack-year history of smoking and currently smoke or have quit within the past 15 years.  Fecal occult blood test (FOBT) of the stool. You may have this test every year starting at age 24.  Flexible sigmoidoscopy or colonoscopy. You may have a sigmoidoscopy every 5 years or a colonoscopy every 10 years starting at age 101.  Hepatitis C blood test.  Hepatitis B blood test.  Sexually transmitted disease (STD) testing.  Diabetes screening. This is done by checking your blood sugar (glucose) after you have not eaten for a while (fasting). You may have this done every 1-3 years.  Bone density scan. This is done to screen for osteoporosis. You may have this done  starting at age 16.  Mammogram. This may be done every 1-2 years. Talk to your health care provider about  how often you should have regular mammograms. Talk with your health care provider about your test results, treatment options, and if necessary, the need for more tests. Vaccines  Your health care provider may recommend certain vaccines, such as:  Influenza vaccine. This is recommended every year.  Tetanus, diphtheria, and acellular pertussis (Tdap, Td) vaccine. You may need a Td booster every 10 years.  Zoster vaccine. You may need this after age 75.  Pneumococcal 13-valent conjugate (PCV13) vaccine. One dose is recommended after age 44.  Pneumococcal polysaccharide (PPSV23) vaccine. One dose is recommended after age 78. Talk to your health care provider about which screenings and vaccines you need and how often you need them. This information is not intended to replace advice given to you by your health care provider. Make sure you discuss any questions you have with your health care provider. Document Released: 01/19/2015 Document Revised: 09/12/2015 Document Reviewed: 10/24/2014 Elsevier Interactive Patient Education  2017 [employer].  Fall Prevention in the Home Falls can cause injuries. They can happen to people of all ages. There are many things you can do to make your home safe and to help prevent falls. What can I do on the outside of my home?  Regularly fix the edges of walkways and driveways and fix any cracks.  Remove anything that might make you trip as you walk through a door, such as a raised step or threshold.  Trim any bushes or trees on the path to your home.  Use bright outdoor lighting.  Clear any walking paths of anything that might make someone trip, such as rocks or tools.  Regularly check to see if handrails are loose or broken. Make sure that both sides of any steps have handrails.  Any raised decks and porches should have guardrails on the edges.  Have any leaves, snow, or ice cleared regularly.  Use sand or salt on walking paths during  winter.  Clean up any spills in your garage right away. This includes oil or grease spills. What can I do in the bathroom?  Use night lights.  Install grab bars by the toilet and in the tub and shower. Do not use towel bars as grab bars.  Use non-skid mats or decals in the tub or shower.  If you need to sit down in the shower, use a plastic, non-slip stool.  Keep the floor dry. Clean up any water that spills on the floor as soon as it happens.  Remove soap buildup in the tub or shower regularly.  Attach bath mats securely with double-sided non-slip rug tape.  Do not have throw rugs and other things on the floor that can make you trip. What can I do in the bedroom?  Use night lights.  Make sure that you have a light by your bed that is easy to reach.  Do not use any sheets or blankets that are too big for your bed. They should not hang down onto the floor.  Have a firm chair that has side arms. You can use this for support while you get dressed.  Do not have throw rugs and other things on the floor that can make you trip. What can I do in the kitchen?  Clean up any spills right away.  Avoid walking on wet floors.  Keep items that you use a lot in easy-to-reach places.  If you need to reach something above you, use a strong step stool that has a grab bar.  Keep electrical cords out of the way.  Do not use floor polish or wax that makes floors slippery. If you must use wax, use non-skid floor wax.  Do not have throw rugs and other things on the floor that can make you trip. What can I do with my stairs?  Do not leave any items on the stairs.  Make sure that there are handrails on both sides of the stairs and use them. Fix handrails that are broken or loose. Make sure that handrails are as long as the stairways.  Check any carpeting to make sure that it is firmly attached to the stairs. Fix any carpet that is loose or worn.  Avoid having throw rugs at the top or  bottom of the stairs. If you do have throw rugs, attach them to the floor with carpet tape.  Make sure that you have a light switch at the top of the stairs and the bottom of the stairs. If you do not have them, ask someone to add them for you. What else can I do to help prevent falls?  Wear shoes that:  Do not have high heels.  Have rubber bottoms.  Are comfortable and fit you well.  Are closed at the toe. Do not wear sandals.  If you use a stepladder:  Make sure that it is fully opened. Do not climb a closed stepladder.  Make sure that both sides of the stepladder are locked into place.  Ask someone to hold it for you, if possible.  Clearly mark and make sure that you can see:  Any grab bars or handrails.  First and last steps.  Where the edge of each step is.  Use tools that help you move around (mobility aids) if they are needed. These include:  Canes.  Walkers.  Scooters.  Crutches.  Turn on the lights when you go into a dark area. Replace any light bulbs as soon as they burn out.  Set up your furniture so you have a clear path. Avoid moving your furniture around.  If any of your floors are uneven, fix them.  If there are any pets around you, be aware of where they are.  Review your medicines with your doctor. Some medicines can make you feel dizzy. This can increase your chance of falling. Ask your doctor what other things that you can do to help prevent falls. This information is not intended to replace advice given to you by your health care provider. Make sure you discuss any questions you have with your health care provider. Document Released: 10/19/2008 Document Revised: 05/31/2015 Document Reviewed: 01/27/2014 Elsevier Interactive Patient Education  2017 [employer].

## 2019-09-02 NOTE — Progress Notes (Addendum)
Subjective:   Lindsay [name] is a 74 y.o. female who presents for Medicare Annual (Subsequent) preventive examination.  Review of Systems    No ROS. Medicare Wellness Visit Cardiac Risk Factors include: advanced age (>27men, >43 women);diabetes mellitus;dyslipidemia;family history of premature cardiovascular disease;hypertension;obesity (BMI >30kg/m2)     Objective:    There were no vitals filed for this visit. There is no height or weight on file to calculate BMI.  Advanced Directives 09/02/2019 07/28/2019 07/26/2019 07/24/2019 07/23/2019 01/22/2018 10/17/2017  Does Patient Have a Medical Advance Directive? No Yes No No No No No  Type of Advance Directive - (No Data) - - - - -  Does patient want to make changes to medical advance directive? - No - Patient declined - - - - -  Would patient like information on creating a medical advance directive? No - Patient declined - - No - Patient declined No - Patient declined No - Patient declined No - Patient declined    Current Medications (verified) Outpatient Encounter Medications as of 09/02/2019  Medication Sig  . aspirin EC 81 MG tablet Take 81 mg by mouth daily.  . budesonide-formoterol (SYMBICORT) 80-4.5 MCG/ACT inhaler Inhale 2 puffs into the lungs 2 (two) times daily.  . cetirizine (ZYRTEC) 10 MG tablet Take 1 tablet (10 mg total) by mouth daily. FOR ASTHMA  . Cholecalciferol (VITAMIN D3) 50 MCG (2000 UT) capsule Take 1 capsule (2,000 Units total) by mouth daily. Must keep scheduled follow=-up appt for future refills  . cyclobenzaprine (FLEXERIL) 10 MG tablet Take 1 tablet (10 mg total) by mouth 2 (two) times daily.  Marland Kitchen docusate sodium (COLACE) 100 MG capsule Take 100 mg by mouth 2 (two) times daily.  . hydrOXYzine (ATARAX/VISTARIL) 10 MG tablet Take 1 tablet (10 mg total) by mouth 3 (three) times daily as needed.  Marland Kitchen levothyroxine (SYNTHROID) 75 MCG tablet Take 1 tablet (75 mcg total) by mouth daily.  Marland Kitchen losartan (COZAAR) 50 MG tablet  Take 1 tablet (50 mg total) by mouth daily. Must keep scheduled follow=-up appt for future refills  . metFORMIN (GLUCOPHAGE) 500 MG tablet TAKE 1 TABLET(500 MG) BY MOUTH DAILY.  . NON FORMULARY DIET- REGULAR CCD  . oxybutynin (DITROPAN) 5 MG tablet Take 1 tablet (5 mg total) by mouth 2 (two) times daily.  . pantoprazole (PROTONIX) 20 MG tablet Take 1 tablet (20 mg total) by mouth daily.  . polyvinyl alcohol (LIQUIFILM TEARS) 1.4 % ophthalmic solution Place 1 drop into both eyes as needed for dry eyes.  . simvastatin (ZOCOR) 40 MG tablet Take 1 tablet (40 mg total) by mouth daily at 6 PM.  . traMADol (ULTRAM) 50 MG tablet Take 1 tablet (50 mg total) by mouth 2 (two) times daily as needed.  . trimethoprim (TRIMPEX) 100 MG tablet Take 1 tablet (100 mg total) by mouth daily.   No facility-administered encounter medications on file as of 09/02/2019.    Allergies (verified) Codeine, Lisinopril, Morphine and related, and Other   History: Past Medical History:  Diagnosis Date  . Acid reflux   . Asthma   . COPD (chronic obstructive pulmonary disease) (HCC)   . Hyperglycemia   . Hypokalemia   . Obesity   . Spinal stenosis   . Urinary tract infection, chronic    Past Surgical History:  Procedure Laterality Date  . ABDOMINAL HYSTERECTOMY    . ANKLE FUSION Left   . CHOLECYSTECTOMY     Family History  Problem Relation Age of Onset  .  Microcephaly Maternal Grandmother   . Heart disease Maternal Grandmother        Not clear details, patient just knows she took "heart pills"  . Asthma Daughter    Social History   Socioeconomic History  . Marital status: Single    Spouse name: Not on file  . Number of children: Not on file  . Years of education: Not on file  . Highest education level: Not on file  Occupational History  . Not on file  Tobacco Use  . Smoking status: Former Smoker    Packs/day: 0.50    Years: 20.00    Pack years: 10.00    Types: Cigarettes    Quit date: 01/06/1997     Years since quitting: 22.6  . Smokeless tobacco: Never Used  Vaping Use  . Vaping Use: Never used  Substance and Sexual Activity  . Alcohol use: No  . Drug use: No  . Sexual activity: Not on file  Other Topics Concern  . Not on file  Social History Narrative  . Not on file   Social Determinants of Health   Financial Resource Strain: Low Risk   . Difficulty of Paying Living Expenses: Not hard at all  Food Insecurity: No Food Insecurity  . Worried About Programme researcher, broadcasting/film/videounning Out of Food in the Last Year: Never true  . Ran Out of Food in the Last Year: Never true  Transportation Needs: No Transportation Needs  . Lack of Transportation (Medical): No  . Lack of Transportation (Non-Medical): No  Physical Activity: Sufficiently Active  . Days of Exercise per Week: 3 days  . Minutes of Exercise per Session: 60 min  Stress: No Stress Concern Present  . Feeling of Stress : Not at all  Social Connections: Unknown  . Frequency of Communication with Friends and Family: More than three times a week  . Frequency of Social Gatherings with Friends and Family: Once a week  . Attends Religious Services: Never  . Active Member of Clubs or Organizations: No  . Attends BankerClub or Organization Meetings: Never  . Marital Status: Patient refused    Tobacco Counseling Counseling given: Not Answered   Clinical Intake:  Pre-visit preparation completed: Yes  Pain : No/denies pain     Nutritional Risks: None Diabetes: Yes CBG done?: No Did pt. bring in CBG monitor from home?: No  How often do you need to have someone help you when you read instructions, pamphlets, or other written materials from your doctor or pharmacy?: 1 - Never What is the last grade level you completed in school?: HSG  Diabetic? yes  Interpreter Needed?: No  Information entered by :: Susie CassetteShenika Bayyinah Dukeman, LPN   Activities of Daily Living In your present state of health, do you have any difficulty performing the following  activities: 09/02/2019  Hearing? N  Vision? N  Difficulty concentrating or making decisions? N  Walking or climbing stairs? Y  Dressing or bathing? N  Doing errands, shopping? Y  Preparing Food and eating ? N  Using the Toilet? N  In the past six months, have you accidently leaked urine? Y  Comment wear Depends for protection  Do you have problems with loss of bowel control? N  Managing your Medications? N  Managing your Finances? N  Housekeeping or managing your Housekeeping? N  Some recent data might be hidden    Patient Care Team: Olive BassMurray, Laura Woodruff, FNP as PCP - General (Internal Medicine)  Indicate any recent Medical Services you may have  received from other than [facility] providers in the past year (date may be approximate).     Assessment:   This is a routine wellness examination for Lindsay [name].  Hearing/Vision screen No exam data present  Dietary issues and exercise activities discussed: Current Exercise Habits: Home exercise routine (in physical therapy), Type of exercise: stretching;Other - see comments (phyical therapy exercises), Time (Minutes): 60, Frequency (Times/Week): 2, Weekly Exercise (Minutes/Week): 120, Intensity: Mild, Exercise limited by: orthopedic condition(s)  Goals   None    Depression Screen PHQ 2/9 Scores 09/02/2019 09/02/2019 05/02/2019 02/11/2018  PHQ - 2 Score 0 0 0 0    Fall Risk Fall Risk  09/02/2019 02/11/2018  Falls in the past year? 0 0  Number falls in past yr: 0 0  Injury with Fall? 0 0  Risk for fall due to : Orthopedic patient;Impaired balance/gait -  Follow up Falls evaluation completed -    Any stairs in or around the home? No  If so, are there any without handrails? No  Home free of loose throw rugs in walkways, pet beds, electrical cords, etc? Yes  Adequate lighting in your home to reduce risk of falls? Yes   ASSISTIVE DEVICES UTILIZED TO PREVENT FALLS:  Life alert? Yes  Use of a cane, walker or w/c? Yes  Grab bars in the  bathroom? Yes  Shower chair or bench in shower? Yes  Elevated toilet seat or a handicapped toilet? Yes   TIMED UP AND GO:  Was the test performed? No .  Length of time to ambulate 10 feet: 0 sec.   Gait slow and steady with assistive device  Cognitive Function: not indicated; patient is cogitatively intact.        Immunizations Immunization History  Administered Date(s) Administered  . Fluad Quad(high Dose 65+) 09/09/2018  . Influenza, High Dose Seasonal PF 03/11/2018  . Moderna SARS-COVID-2 Vaccination 02/18/2019, 03/18/2019  . Pneumococcal Polysaccharide-23 11/04/2016  . Zoster Recombinat (Shingrix) 11/03/2016    TDAP status: Due, Education has been provided regarding the importance of this vaccine. Advised may receive this vaccine at local pharmacy or Health Dept. Aware to provide a copy of the vaccination record if obtained from local pharmacy or Health Dept. Verbalized acceptance and understanding. Flu Vaccine status: Up to date Pneumococcal vaccine status: Up to date Covid-19 vaccine status: Completed vaccines  Qualifies for Shingles Vaccine? Yes   Zostavax completed No   Shingrix Completed?: No.    Education has been provided regarding the importance of this vaccine. Patient has been advised to call insurance company to determine out of pocket expense if they have not yet received this vaccine. Advised may also receive vaccine at local pharmacy or Health Dept. Verbalized acceptance and understanding.  Screening Tests Health Maintenance  Topic Date Due  . PNA vac Low Risk Adult (2 of 2 - PCV13) 11/04/2017  . INFLUENZA VACCINE  08/07/2019  . TETANUS/TDAP  01/09/2020 (Originally 06/28/1964)  . FOOT EXAM  09/14/2019  . HEMOGLOBIN A1C  11/01/2019  . OPHTHALMOLOGY EXAM  06/07/2020  . MAMMOGRAM  11/23/2020  . Fecal DNA (Cologuard)  09/24/2021  . DEXA SCAN  Completed  . COVID-19 Vaccine  Completed  . Hepatitis C Screening  Completed    Health Maintenance  Health  Maintenance Due  Topic Date Due  . PNA vac Low Risk Adult (2 of 2 - PCV13) 11/04/2017  . INFLUENZA VACCINE  08/07/2019    Colorectal cancer screening: Completed 10/01/2018. Repeat every 3 years Mammogram status: Completed 11/24/2018.  Repeat every year  Lung Cancer Screening: (Low Dose CT Chest recommended if Age 40-80 years, 30 pack-year currently smoking OR have quit w/in 15years.) does not qualify.   Lung Cancer Screening Referral: no  Additional Screening:  Hepatitis C Screening: does qualify; Completed yes  Vision Screening: Recommended annual ophthalmology exams for early detection of glaucoma and other disorders of the eye. Is the patient up to date with their annual eye exam?  Yes  Who is the provider or what is the name of the office in which the patient attends annual eye exams? [facility] If pt is not established with a provider, would they like to be referred to a provider to establish care? No .   Dental Screening: Recommended annual dental exams for proper oral hygiene  Community Resource Referral / Chronic Care Management: CRR required this visit?  No   CCM required this visit?  No      Plan:     I have personally reviewed and noted the following in the patient's chart:   . Medical and social history . Use of alcohol, tobacco or illicit drugs  . Current medications and supplements . Functional ability and status . Nutritional status . Physical activity . Advanced directives . List of other physicians . Hospitalizations, surgeries, and ER visits in previous 12 months . Vitals . Screenings to include cognitive, depression, and falls . Referrals and appointments  In addition, I have reviewed and discussed with patient certain preventive protocols, quality metrics, and best practice recommendations. A written personalized care plan for preventive services as well as general preventive health recommendations were provided to patient.     Mickeal Needy, LPN   [id number]   Nurse Notes:  Patient is cogitatively intact. There were no vitals filed for this visit. There is no height or weight on file to calculate BMI. Patient uses assistive devices such as a cane and walker to assist with unsteady gait and balance.  Medical screening examination/treatment/procedure(s) were performed by non-physician practitioner and as supervising provider I was immediately available for consultation/collaboration.  I agree with above. Olive Bass, FNP

## 2019-09-02 NOTE — Progress Notes (Signed)
Lindsay [name] is a 74 y.o. female with the following history as recorded in Epic[facility]:  Patient Active Problem List   Diagnosis Date Noted  . Anemia, unspecified 07/28/2019  . Right knee pain 07/28/2019  . Hypothyroidism 01/12/2019  . Ganglion of flexor tendon sheath of right middle finger 07/15/2018  . Type 2 diabetes mellitus without complication, without long-term current use of insulin ([place]) 02/11/2018  . Essential hypertension 02/11/2018  . Hyperlipidemia 02/11/2018  . Cough variant asthma 11/03/2017  . Chest pain 05/17/2016  . Obesity 05/17/2016  . COPD (chronic obstructive pulmonary disease) ([place])   . Acid reflux   . Urinary tract infection, chronic   . Hypokalemia   . COPD exacerbation ([place]) 01/24/2013    Current Outpatient Medications  Medication Sig Dispense Refill  . aspirin EC 81 MG tablet Take 81 mg by mouth daily.    . budesonide-formoterol (SYMBICORT) 80-4.5 MCG/ACT inhaler Inhale 2 puffs into the lungs 2 (two) times daily. 6.9 g 0  . cetirizine (ZYRTEC) 10 MG tablet Take 1 tablet (10 mg total) by mouth daily. FOR ASTHMA 30 tablet 0  . Cholecalciferol (VITAMIN D3) 50 MCG (2000 UT) capsule Take 1 capsule (2,000 Units total) by mouth daily. Must keep scheduled follow=-up appt for future refills 30 capsule 0  . cyclobenzaprine (FLEXERIL) 10 MG tablet Take 1 tablet (10 mg total) by mouth 2 (two) times daily. 60 tablet 0  . docusate sodium (COLACE) 100 MG capsule Take 100 mg by mouth 2 (two) times daily.    . hydrOXYzine (ATARAX/VISTARIL) 10 MG tablet Take 1 tablet (10 mg total) by mouth 3 (three) times daily as needed. 30 tablet 0  . levothyroxine (SYNTHROID) 75 MCG tablet Take 1 tablet (75 mcg total) by mouth daily. 90 tablet 3  . losartan (COZAAR) 50 MG tablet Take 1 tablet (50 mg total) by mouth daily. Must keep scheduled follow=-up appt for future refills 30 tablet 0  . metFORMIN (GLUCOPHAGE) 500 MG tablet TAKE 1 TABLET(500 MG) BY MOUTH DAILY. 90 tablet 0  . NON  FORMULARY DIET- REGULAR CCD    . oxybutynin (DITROPAN) 5 MG tablet Take 1 tablet (5 mg total) by mouth 2 (two) times daily. 60 tablet 0  . pantoprazole (PROTONIX) 20 MG tablet Take 1 tablet (20 mg total) by mouth daily. 30 tablet 0  . polyvinyl alcohol (LIQUIFILM TEARS) 1.4 % ophthalmic solution Place 1 drop into both eyes as needed for dry eyes. 15 mL 0  . simvastatin (ZOCOR) 40 MG tablet Take 1 tablet (40 mg total) by mouth daily at 6 PM. 30 tablet 0  . traMADol (ULTRAM) 50 MG tablet Take 1 tablet (50 mg total) by mouth 2 (two) times daily as needed. 15 tablet 0  . trimethoprim (TRIMPEX) 100 MG tablet Take 1 tablet (100 mg total) by mouth daily. 30 tablet 0   No current facility-administered medications for this visit.    Allergies: Codeine, Lisinopril, Morphine and related, and Other  Past Medical History:  Diagnosis Date  . Acid reflux   . Asthma   . COPD (chronic obstructive pulmonary disease) ([place])   . Hyperglycemia   . Hypokalemia   . Obesity   . Spinal stenosis   . Urinary tract infection, chronic     Past Surgical History:  Procedure Laterality Date  . ABDOMINAL HYSTERECTOMY    . ANKLE FUSION Left   . CHOLECYSTECTOMY      Family History  Problem Relation Age of Onset  . Microcephaly Maternal  Grandmother   . Heart disease Maternal Grandmother        Not clear details, patient just knows she took "heart pills"  . Asthma Daughter     Social History   Tobacco Use  . Smoking status: Former Smoker    Packs/day: 0.50    Years: 20.00    Pack years: 10.00    Types: Cigarettes    Quit date: 01/06/1997    Years since quitting: 22.6  . Smokeless tobacco: Never Used  Substance Use Topics  . Alcohol use: No    Subjective:  Patient will be having knee surgery ( right knee) and her orthopedist is requesting updated Hgba1c to schedule for knee replacement;  Continuing to work with endocrinologist for hypothyroidism;  Denies any chest pain, shortness of breath, blurred vision  or headache    Objective:  Vitals:   09/02/19 1337  BP: 126/70  Pulse: 88  Temp: 98.2 F (36.8 C)  TempSrc: Oral  SpO2: 97%  Weight: 194 lb (88 kg)  Height: 5' 3"  (1.6 m)    General: Well developed, well nourished, in no acute distress  Skin : Warm and dry.  Head: Normocephalic and atraumatic  Lungs: Respirations unlabored; clear to auscultation bilaterally without wheeze, rales, rhonchi  CVS exam: normal rate and regular rhythm.  Musculoskeletal: No deformities; no active joint inflammation  Extremities: No edema, cyanosis, clubbing  Vessels: Symmetric bilaterally  Neurologic: Alert and oriented; speech intact; face symmetrical; uses cane   Assessment:  1. Type 2 diabetes mellitus without complication, without long-term current use of insulin ([name])   2. Essential hypertension   3. Chronic obstructive pulmonary disease, unspecified COPD type ([name])   4. Low hemoglobin     Plan:  1. Check Hgba1c today; 2. Stable; refill updated; 3. Planning to see pulmonology next week in preparation for upcoming orthopedic surgery; 4. Check ferritin level today; if still anemic, will need to consider further evaluation.   This visit occurred during the SARS-CoV-2 public health emergency.  Safety protocols were in place, including screening questions prior to the visit, additional usage of staff PPE, and extensive cleaning of exam room while observing appropriate contact time as indicated for disinfecting solutions.     No follow-ups on file.  Orders Placed This Encounter  Procedures  . CBC with Differential/Platelet    Standing Status:   Future    Standing Expiration Date:   09/01/2020  . Comp Met (CMET)    Standing Status:   Future    Standing Expiration Date:   09/01/2020  . Hemoglobin A1c    Standing Status:   Future    Standing Expiration Date:   09/01/2020  . Ferritin    Standing Status:   Future    Standing Expiration Date:   09/01/2020    Requested Prescriptions   Signed  Prescriptions Disp Refills  . metFORMIN (GLUCOPHAGE) 500 MG tablet 90 tablet 0    Sig: TAKE 1 TABLET(500 MG) BY MOUTH DAILY.

## 2019-09-03 LAB — HEMOGLOBIN A1C
Hgb A1c MFr Bld: 5.9 % of total Hgb — ABNORMAL HIGH (ref <5.7)
Mean Plasma Glucose: 123 (calc)
eAG (mmol/L): 6.8 (calc)

## 2019-09-03 LAB — CBC WITH DIFFERENTIAL/PLATELET
Absolute Monocytes: 630 cells/uL (ref 200–950)
Basophils Absolute: 54 cells/uL (ref 0–200)
Basophils Relative: 0.6 %
Eosinophils Absolute: 432 cells/uL (ref 15–500)
Eosinophils Relative: 4.8 %
HCT: 32.8 % — ABNORMAL LOW (ref 35.0–45.0)
Hemoglobin: 10.5 g/dL — ABNORMAL LOW (ref 11.7–15.5)
Lymphs Abs: 2997 cells/uL (ref 850–3900)
MCH: 26.4 pg — ABNORMAL LOW (ref 27.0–33.0)
MCHC: 32 g/dL (ref 32.0–36.0)
MCV: 82.4 fL (ref 80.0–100.0)
MPV: 10.1 fL (ref 7.5–12.5)
Monocytes Relative: 7 %
Neutro Abs: 4887 cells/uL (ref 1500–7800)
Neutrophils Relative %: 54.3 %
Platelets: 396 10*3/uL (ref 140–400)
RBC: 3.98 10*6/uL (ref 3.80–5.10)
RDW: 14.2 % (ref 11.0–15.0)
Total Lymphocyte: 33.3 %
WBC: 9 10*3/uL (ref 3.8–10.8)

## 2019-09-03 LAB — COMPREHENSIVE METABOLIC PANEL
AG Ratio: 1.6 (calc) (ref 1.0–2.5)
ALT: 7 U/L (ref 6–29)
AST: 12 U/L (ref 10–35)
Albumin: 4.3 g/dL (ref 3.6–5.1)
Alkaline phosphatase (APISO): 57 U/L (ref 37–153)
BUN: 22 mg/dL (ref 7–25)
CO2: 24 mmol/L (ref 20–32)
Calcium: 10.1 mg/dL (ref 8.6–10.4)
Chloride: 105 mmol/L (ref 98–110)
Creat: 0.9 mg/dL (ref 0.60–0.93)
Globulin: 2.7 g/dL (calc) (ref 1.9–3.7)
Glucose, Bld: 89 mg/dL (ref 65–99)
Potassium: 4.3 mmol/L (ref 3.5–5.3)
Sodium: 139 mmol/L (ref 135–146)
Total Bilirubin: 0.2 mg/dL (ref 0.2–1.2)
Total Protein: 7 g/dL (ref 6.1–8.1)

## 2019-09-03 LAB — FERRITIN: Ferritin: 92 ng/mL (ref 16–288)

## 2019-09-05 ENCOUNTER — Other Ambulatory Visit: Payer: Self-pay

## 2019-09-05 ENCOUNTER — Ambulatory Visit (INDEPENDENT_AMBULATORY_CARE_PROVIDER_SITE_OTHER): Payer: Medicare Other

## 2019-09-05 ENCOUNTER — Encounter: Payer: Self-pay | Admitting: Primary Care

## 2019-09-05 ENCOUNTER — Ambulatory Visit (INDEPENDENT_AMBULATORY_CARE_PROVIDER_SITE_OTHER): Payer: Medicare Other | Admitting: Primary Care

## 2019-09-05 VITALS — BP 136/80 | HR 84 | Temp 97.7°F | Ht 63.0 in | Wt 195.0 lb

## 2019-09-05 DIAGNOSIS — Z01811 Encounter for preprocedural respiratory examination: Secondary | ICD-10-CM | POA: Diagnosis not present

## 2019-09-05 DIAGNOSIS — J45991 Cough variant asthma: Secondary | ICD-10-CM

## 2019-09-05 DIAGNOSIS — Z0000 Encounter for general adult medical examination without abnormal findings: Secondary | ICD-10-CM | POA: Insufficient documentation

## 2019-09-05 DIAGNOSIS — R05 Cough: Secondary | ICD-10-CM | POA: Diagnosis not present

## 2019-09-05 DIAGNOSIS — J449 Chronic obstructive pulmonary disease, unspecified: Secondary | ICD-10-CM

## 2019-09-05 DIAGNOSIS — J45909 Unspecified asthma, uncomplicated: Secondary | ICD-10-CM | POA: Diagnosis not present

## 2019-09-05 MED ORDER — ALBUTEROL SULFATE HFA 108 (90 BASE) MCG/ACT IN AERS: 2.0000 | INHALATION_SPRAY | Freq: Four times a day (QID) | RESPIRATORY_TRACT | 5 refills | Status: DC | PRN

## 2019-09-05 MED ORDER — CETIRIZINE HCL 10 MG PO TABS: 10.0000 mg | ORAL_TABLET | Freq: Every day | ORAL | 5 refills | Status: DC

## 2019-09-05 MED ORDER — MONTELUKAST SODIUM 10 MG PO TABS: 10.0000 mg | ORAL_TABLET | Freq: Every day | ORAL | 11 refills | Status: DC

## 2019-09-05 MED ORDER — BUDESONIDE-FORMOTEROL FUMARATE 80-4.5 MCG/ACT IN AERO: 2.0000 | INHALATION_SPRAY | Freq: Two times a day (BID) | RESPIRATORY_TRACT | 5 refills | Status: DC

## 2019-09-05 NOTE — Progress Notes (Signed)
Please let patient know pre-op CXR was normal.

## 2019-09-05 NOTE — Progress Notes (Signed)
@Patient  ID: , female    DOB: [birth year], 74 y.o.   MRN: [id number]  Chief Complaint  Patient presents with  . Follow-up    reports feeling "okay"     Referring provider: [id number],*  HPI: 74 year old female, former smoker quit 1999.  Past medical history significant for cough variant asthma.  Patient of Dr. 2000, last seen on 12/15/2017.  Maintained on Symbicort 80 two puffs twice daily, as needed albuterol HFA.  09/05/2019 Patient presents today for regular follow-up/surgical clearance. She is planning right total knee replacement with Dr. 09/07/2019, date to be determined. Her breathing has stable. Occasionally experiences cough and/or chest congested but states that it is nothing major. She is compliant with Symbicort hfa 80 two puffs twice daily and montelukast. She uses her Albuterol rescue inhaler only as needed, approx twice a week. No recent exacerbations or hospitalizations for asthma. Never previous intubations. She is not on oxygen. She is independent. Ambulating with cane.    Pulmonary function testing:  Spirometry 11/03/2017  FEV1 2.6 (92%)  Ratio 78 p neb > 4 h prior  Allergies  Allergen Reactions  . Codeine Nausea Only  . Lisinopril     cough  . Morphine And Related Itching  . Other Other (See Comments)    Seafood : itching    Immunization History  Administered Date(s) Administered  . DTaP 02/10/2015  . Fluad Quad(high Dose 65+) 09/09/2018  . Influenza, High Dose Seasonal PF 03/11/2018  . Moderna SARS-COVID-2 Vaccination 02/18/2019, 03/18/2019  . Pneumococcal Conjugate-13 10/12/2014  . Pneumococcal Polysaccharide-23 11/04/2016  . Zoster Recombinat (Shingrix) 08/21/2016, 11/03/2016    Past Medical History:  Diagnosis Date  . Acid reflux   . Asthma   . COPD (chronic obstructive pulmonary disease) ([facility])   . Hyperglycemia   . Hypokalemia   . Obesity   . Spinal stenosis   . Urinary tract infection, chronic     Tobacco  History: Social History   Tobacco Use  Smoking Status Former Smoker  . Packs/day: 0.50  . Years: 20.00  . Pack years: 10.00  . Types: Cigarettes  . Quit date: 01/06/1997  . Years since quitting: 22.6  Smokeless Tobacco Never Used   Counseling given: Not Answered   Outpatient Medications Prior to Visit  Medication Sig Dispense Refill  . aspirin EC 81 MG tablet Take 81 mg by mouth daily.    . Cholecalciferol (VITAMIN D3) 50 MCG (2000 UT) capsule Take 1 capsule (2,000 Units total) by mouth daily. Must keep scheduled follow=-up appt for future refills 30 capsule 0  . cyclobenzaprine (FLEXERIL) 10 MG tablet Take 1 tablet (10 mg total) by mouth 2 (two) times daily. 60 tablet 0  . docusate sodium (COLACE) 100 MG capsule Take 100 mg by mouth 2 (two) times daily.    . hydrOXYzine (ATARAX/VISTARIL) 10 MG tablet Take 1 tablet (10 mg total) by mouth 3 (three) times daily as needed. 30 tablet 0  . levothyroxine (SYNTHROID) 75 MCG tablet Take 1 tablet (75 mcg total) by mouth daily. 90 tablet 3  . losartan (COZAAR) 50 MG tablet Take 1 tablet (50 mg total) by mouth daily. Must keep scheduled follow=-up appt for future refills 30 tablet 0  . metFORMIN (GLUCOPHAGE) 500 MG tablet TAKE 1 TABLET(500 MG) BY MOUTH DAILY. 90 tablet 0  . NON FORMULARY DIET- REGULAR CCD    . oxybutynin (DITROPAN) 5 MG tablet Take 1 tablet (5 mg total) by mouth 2 (two) times daily. 60  tablet 0  . pantoprazole (PROTONIX) 20 MG tablet Take 1 tablet (20 mg total) by mouth daily. 30 tablet 0  . polyvinyl alcohol (LIQUIFILM TEARS) 1.4 % ophthalmic solution Place 1 drop into both eyes as needed for dry eyes. 15 mL 0  . simvastatin (ZOCOR) 40 MG tablet Take 1 tablet (40 mg total) by mouth daily at 6 PM. 30 tablet 0  . traMADol (ULTRAM) 50 MG tablet Take 1 tablet (50 mg total) by mouth 2 (two) times daily as needed. 15 tablet 0  . trimethoprim (TRIMPEX) 100 MG tablet Take 1 tablet (100 mg total) by mouth daily. 30 tablet 0  .  budesonide-formoterol (SYMBICORT) 80-4.5 MCG/ACT inhaler Inhale 2 puffs into the lungs 2 (two) times daily. 6.9 g 0  . cetirizine (ZYRTEC) 10 MG tablet Take 1 tablet (10 mg total) by mouth daily. FOR ASTHMA 30 tablet 0   No facility-administered medications prior to visit.    Review of Systems  Review of Systems  Constitutional: Negative.   Respiratory: Negative for cough, chest tightness, shortness of breath and wheezing.   Cardiovascular: Negative.    Physical Exam  BP 136/80 (BP Location: Right Arm)   Pulse 84   Temp 97.7 F (36.5 C)   Ht 5\' 3"  (1.6 m)   Wt 195 lb (88.5 kg)   SpO2 98%   BMI 34.54 kg/m  Physical Exam Constitutional:      General: She is not in acute distress.    Appearance: Normal appearance. She is not ill-appearing.  HENT:     Head: Normocephalic and atraumatic.     Mouth/Throat:     Mouth: Mucous membranes are moist.     Pharynx: Oropharynx is clear.  Cardiovascular:     Rate and Rhythm: Normal rate and regular rhythm.  Pulmonary:     Effort: Pulmonary effort is normal.     Breath sounds: Normal breath sounds. No wheezing or rhonchi.  Neurological:     General: No focal deficit present.     Mental Status: She is alert and oriented to person, place, and time. Mental status is at baseline.  Psychiatric:        Mood and Affect: Mood normal.        Behavior: Behavior normal.        Thought Content: Thought content normal.        Judgment: Judgment normal.      Lab Results:  CBC    Component Value Date/Time   WBC 9.0 09/02/2019 1410   RBC 3.98 09/02/2019 1410   HGB 10.5 (L) 09/02/2019 1410   HCT 32.8 (L) 09/02/2019 1410   PLT 396 09/02/2019 1410   MCV 82.4 09/02/2019 1410   MCH 26.4 (L) 09/02/2019 1410   MCHC 32.0 09/02/2019 1410   RDW 14.2 09/02/2019 1410   LYMPHSABS 2,997 09/02/2019 1410   MONOABS 0.9 07/25/2019 0221   EOSABS 432 09/02/2019 1410   BASOSABS 54 09/02/2019 1410    BMET    Component Value Date/Time   NA 139  09/02/2019 1410   K 4.3 09/02/2019 1410   CL 105 09/02/2019 1410   CO2 24 09/02/2019 1410   GLUCOSE 89 09/02/2019 1410   BUN 22 09/02/2019 1410   CREATININE 0.90 09/02/2019 1410   CALCIUM 10.1 09/02/2019 1410   GFRNONAA >60 07/25/2019 0221   GFRAA >60 07/25/2019 0221    BNP No results found for: BNP  ProBNP No results found for: PROBNP  Imaging: No results found.  Assessment & Plan:   Cough variant asthma - Spirometry 12/15/2017 FEV1 1.6L (94%), ratio 75 - Continue to use Symbicort 80 two puffs twice daily - Continue Singulair 10mg  at bedtime - Use albuterol rescue inhaler 2 puffs every 6 hours as needed for breakthrough shortness of breath or wheezing    Pre-operative respiratory examination -  Cleared from pulmonary standpoint for right total knee replacement with Dr. . She is low risk for pulmonary complications d/t risk stratification. Checking CXR today. She is to use prescribed inhalers day of surgery and bring them with her. Patient to notify office if she develops any acute exacerbation symptoms between now and time of her surgery. Encourage early ambulation and use of IS.   Recommend 1. Short duration of surgery as much as possible and avoid paralytic if possible 2. Recovery in step down or ICU with Pulmonary consultation 3. DVT prophylaxis 4. Aggressive pulmonary toilet with o2, bronchodilatation, and incentive spirometry and early ambulation      1) RISK FOR PROLONGED MECHANICAL VENTILAION - > 48h  1A) Arozullah - Prolonged mech ventilation risk Arozullah Postperative Pulmonary Risk Score - for mech ventilation dependence >48h Luiz Blare, Ann Surg 2000, major non-cardiac surgery) Comment Score  Type of surgery - abd ao aneurysm (27), thoracic (21), neurosurgery / upper abdominal / vascular (21), neck (11) Knee surgery  0  Emergency Surgery - (11)  0  ALbumin < 3 or poor nutritional state - (9)  0  BUN > 30 -  (8)  0  Partial or completely  dependent functional status - (7)  0  COPD -  (6)  6  Age - 60 to 69 (4), > 70  (6)  6  TOTAL  12  Risk Stratifcation scores  - < 10 (0.5%), 11-19 (1.8%), 20-27 (4.2%), 28-40 (10.1%), >40 (26.6%)  1.8% risk for mech ventilation      1B) GUPTA - Prolonged Mech Vent Risk Score source Risk  Guptal post op prolonged mech ventilation > 48h or reintubation < 30 days - ACS 2007-2008 dataset - 02-09-1973 0.3 % Risk of mechanical ventilation for >48 hrs after surgery, or unplanned intubation ?30 days of surgery    2) RISK FOR POST OP PNEUMONIA Score source Risk  [email or web address] - Post Op Pnemounia risk  Chales Abrahams 0.5 % Risk of postoperative pneumonia     R3) ISK FOR ANY POST-OP PULMONARY COMPLICATION Score source Risk  CANET/ARISCAT Score - risk for ANY/ALl pulmonary complications - > risk of in-hospital post-op pulmonary complications (composite including respiratory failure, respiratory infection, pleural effusion, atelectasis, pneumothorax, bronchospasm, aspiration pneumonitis) [email or web address] - based on age, anemia, pulse ox, resp infection prior 30d, incision site, duration of surgery, and emergency v elective surgery Low risk 1.6% risk of in-hospital post-op pulmonary complications (composite including respiratory failure, respiratory infection, pleural effusion, atelectasis, pneumothorax, bronchospasm, aspiration pneumonitis)      [email or web address], NP 09/05/2019

## 2019-09-05 NOTE — Patient Instructions (Signed)
Cough variant asthma: - Continue to use Symbicort two puffs twice daily - Continue Singulair 10mg  at bedtime - Use albuterol rescue inhaler 2 puffs every 6 hours as needed for breakthrough shortness of breath or wheezing   - Refills sent to pharmacy   Pre-op recommendations:  - CXR today for pre-op clearance - Use inhalers day of surgery - After surgery make sure to get out of bed as soon as they allow you, use incentive spirometer 10 breaths an hour   Follow-up: - 6 months with Dr. 

## 2019-09-05 NOTE — Assessment & Plan Note (Signed)
-   Spirometry 12/15/2017 FEV1 1.6L (94%), ratio 75 - Continue to use Symbicort 80 two puffs twice daily - Continue Singulair 10mg  at bedtime - Use albuterol rescue inhaler 2 puffs every 6 hours as needed for breakthrough shortness of breath or wheezing

## 2019-09-05 NOTE — Assessment & Plan Note (Addendum)
-    Cleared from pulmonary standpoint for right total knee replacement with Dr. Luiz Blare. She is low risk for pulmonary complications d/t risk stratification. Checking CXR today. She is to use prescribed inhalers day of surgery and bring them with her. Patient to notify office if she develops any acute exacerbation symptoms between now and time of her surgery. Encourage early ambulation and use of IS.   Recommend 1. Short duration of surgery as much as possible and avoid paralytic if possible 2. Recovery in step down or ICU with Pulmonary consultation 3. DVT prophylaxis 4. Aggressive pulmonary toilet with o2, bronchodilatation, and incentive spirometry and early ambulation

## 2019-09-06 ENCOUNTER — Telehealth: Payer: Self-pay | Admitting: Primary Care

## 2019-09-06 DIAGNOSIS — I10 Essential (primary) hypertension: Secondary | ICD-10-CM | POA: Diagnosis not present

## 2019-09-06 DIAGNOSIS — M25561 Pain in right knee: Secondary | ICD-10-CM | POA: Diagnosis not present

## 2019-09-06 DIAGNOSIS — Z87440 Personal history of urinary (tract) infections: Secondary | ICD-10-CM | POA: Diagnosis not present

## 2019-09-06 DIAGNOSIS — J449 Chronic obstructive pulmonary disease, unspecified: Secondary | ICD-10-CM | POA: Diagnosis not present

## 2019-09-06 DIAGNOSIS — N3281 Overactive bladder: Secondary | ICD-10-CM | POA: Diagnosis not present

## 2019-09-06 DIAGNOSIS — K5901 Slow transit constipation: Secondary | ICD-10-CM | POA: Diagnosis not present

## 2019-09-06 DIAGNOSIS — E039 Hypothyroidism, unspecified: Secondary | ICD-10-CM | POA: Diagnosis not present

## 2019-09-06 DIAGNOSIS — Z7982 Long term (current) use of aspirin: Secondary | ICD-10-CM | POA: Diagnosis not present

## 2019-09-06 DIAGNOSIS — Z9049 Acquired absence of other specified parts of digestive tract: Secondary | ICD-10-CM | POA: Diagnosis not present

## 2019-09-06 DIAGNOSIS — M25461 Effusion, right knee: Secondary | ICD-10-CM | POA: Diagnosis not present

## 2019-09-06 DIAGNOSIS — E119 Type 2 diabetes mellitus without complications: Secondary | ICD-10-CM | POA: Diagnosis not present

## 2019-09-06 DIAGNOSIS — Z7984 Long term (current) use of oral hypoglycemic drugs: Secondary | ICD-10-CM | POA: Diagnosis not present

## 2019-09-06 DIAGNOSIS — K219 Gastro-esophageal reflux disease without esophagitis: Secondary | ICD-10-CM | POA: Diagnosis not present

## 2019-09-06 DIAGNOSIS — N390 Urinary tract infection, site not specified: Secondary | ICD-10-CM | POA: Diagnosis not present

## 2019-09-06 DIAGNOSIS — G8911 Acute pain due to trauma: Secondary | ICD-10-CM | POA: Diagnosis not present

## 2019-09-06 DIAGNOSIS — M48061 Spinal stenosis, lumbar region without neurogenic claudication: Secondary | ICD-10-CM | POA: Diagnosis not present

## 2019-09-06 DIAGNOSIS — Z9181 History of falling: Secondary | ICD-10-CM | POA: Diagnosis not present

## 2019-09-06 DIAGNOSIS — E785 Hyperlipidemia, unspecified: Secondary | ICD-10-CM | POA: Diagnosis not present

## 2019-09-06 NOTE — Telephone Encounter (Signed)
Pt returning a phone call. Pt can be reached at [phone]

## 2019-09-06 NOTE — Telephone Encounter (Signed)
Left a voicemail on patient's phone to call our office back regarding her CXR result's.

## 2019-09-06 NOTE — Telephone Encounter (Signed)
Spoke with patient regarding CXR results.Advised patient her CXR was normal. Patient's voice was understanding. Nothing else further needed.

## 2019-09-08 ENCOUNTER — Telehealth: Payer: Self-pay | Admitting: Emergency Medicine

## 2019-09-08 NOTE — Telephone Encounter (Signed)
[facility] called and stated they need the patients most recent labs faxed over. Fax number is [phone]. Thanks.

## 2019-09-08 NOTE — Telephone Encounter (Signed)
Spoke with Darel Hong and she has received labs from 09/05/19.

## 2019-09-13 ENCOUNTER — Other Ambulatory Visit: Payer: Self-pay | Admitting: Orthopedic Surgery

## 2019-09-16 DIAGNOSIS — K5901 Slow transit constipation: Secondary | ICD-10-CM | POA: Diagnosis not present

## 2019-09-16 DIAGNOSIS — Z9181 History of falling: Secondary | ICD-10-CM | POA: Diagnosis not present

## 2019-09-16 DIAGNOSIS — Z87440 Personal history of urinary (tract) infections: Secondary | ICD-10-CM | POA: Diagnosis not present

## 2019-09-16 DIAGNOSIS — J449 Chronic obstructive pulmonary disease, unspecified: Secondary | ICD-10-CM | POA: Diagnosis not present

## 2019-09-16 DIAGNOSIS — E785 Hyperlipidemia, unspecified: Secondary | ICD-10-CM | POA: Diagnosis not present

## 2019-09-16 DIAGNOSIS — M25561 Pain in right knee: Secondary | ICD-10-CM | POA: Diagnosis not present

## 2019-09-16 DIAGNOSIS — N390 Urinary tract infection, site not specified: Secondary | ICD-10-CM | POA: Diagnosis not present

## 2019-09-16 DIAGNOSIS — M48061 Spinal stenosis, lumbar region without neurogenic claudication: Secondary | ICD-10-CM | POA: Diagnosis not present

## 2019-09-16 DIAGNOSIS — N3281 Overactive bladder: Secondary | ICD-10-CM | POA: Diagnosis not present

## 2019-09-16 DIAGNOSIS — Z7982 Long term (current) use of aspirin: Secondary | ICD-10-CM | POA: Diagnosis not present

## 2019-09-16 DIAGNOSIS — K219 Gastro-esophageal reflux disease without esophagitis: Secondary | ICD-10-CM | POA: Diagnosis not present

## 2019-09-16 DIAGNOSIS — G8911 Acute pain due to trauma: Secondary | ICD-10-CM | POA: Diagnosis not present

## 2019-09-16 DIAGNOSIS — Z9049 Acquired absence of other specified parts of digestive tract: Secondary | ICD-10-CM | POA: Diagnosis not present

## 2019-09-16 DIAGNOSIS — E039 Hypothyroidism, unspecified: Secondary | ICD-10-CM | POA: Diagnosis not present

## 2019-09-16 DIAGNOSIS — E119 Type 2 diabetes mellitus without complications: Secondary | ICD-10-CM | POA: Diagnosis not present

## 2019-09-16 DIAGNOSIS — Z7984 Long term (current) use of oral hypoglycemic drugs: Secondary | ICD-10-CM | POA: Diagnosis not present

## 2019-09-16 DIAGNOSIS — M25461 Effusion, right knee: Secondary | ICD-10-CM | POA: Diagnosis not present

## 2019-09-16 DIAGNOSIS — I10 Essential (primary) hypertension: Secondary | ICD-10-CM | POA: Diagnosis not present

## 2019-09-16 NOTE — Patient Instructions (Signed)
DUE TO COVID-19 ONLY ONE VISITOR IS ALLOWED TO COME WITH YOU AND STAY IN THE WAITING ROOM ONLY DURING PRE OP AND PROCEDURE DAY OF SURGERY. THE 1 VISITOR  MAY VISIT WITH YOU AFTER SURGERY IN YOUR PRIVATE ROOM DURING VISITING HOURS ONLY!  YOU NEED TO HAVE A COVID 19 TEST ON_______ @_______ , THIS TEST MUST BE DONE BEFORE SURGERY,  COVID TESTING SITE [address] [place] , IT IS ON THE RIGHT GOING OUT [address] APPROXIMATELY  2 MINUTES PAST [employer] ON THE RIGHT. ONCE YOUR COVID TEST IS COMPLETED,  PLEASE BEGIN THE QUARANTINE INSTRUCTIONS AS OUTLINED IN YOUR HANDOUT.                Lindsay [name]  09/16/2019   Your procedure is scheduled on:    Report to [facility] Main  Entrance   Report to admitting at AM     Call this number if you have problems the morning of surgery [phone]    Remember: Do not eat food or drink liquids :After Midnight. BRUSH YOUR TEETH MORNING OF SURGERY AND RINSE YOUR MOUTH OUT, NO CHEWING GUM CANDY OR MINTS.   No food after midnight.  You may have clear liquid until 4:30 AM.  At 4:30 AM drink pre surgery drink. Nothing by mouth after 4:30 AM.  Take these medicines the morning of surgery with A SIP OF WATER:   DO NOT TAKE ANY DIABETIC MEDICATIONS DAY OF YOUR SURGERY                               You may not have any metal on your body including hair pins and              piercings  Do not wear jewelry, make-up, lotions, powders or perfumes, deodorant             Do not wear nail polish on your fingernails.  Do not shave  48 hours prior to surgery.              Men may shave face and neck.   Do not bring valuables to the hospital. [facility] IS NOT             RESPONSIBLE   FOR VALUABLES.  Contacts, dentures or bridgework may not be worn into surgery.  Leave suitcase in the car. After surgery it may be brought to your room.     Patients discharged the day of surgery will not be allowed to drive home. IF YOU  ARE HAVING SURGERY AND GOING HOME THE SAME DAY, YOU MUST HAVE AN ADULT TO DRIVE YOU HOME AND BE WITH YOU FOR 24 HOURS. YOU MAY GO HOME BY TAXI OR UBER OR ORTHERWISE, BUT AN ADULT MUST ACCOMPANY YOU HOME AND STAY WITH YOU FOR 24 HOURS.  Name and phone number of your driver:  Special Instructions: N/A              Please read over the following fact sheets you were given: _____________________________________________________________________             [facility] - Preparing for Surgery Before surgery, you can play an important role.  Because skin is not sterile, your skin needs to be as free of germs as possible.  You can reduce the number of germs on your skin by washing with CHG (chlorahexidine gluconate) soap before surgery.  CHG is  an antiseptic cleaner which kills germs and bonds with the skin to continue killing germs even after washing. Please DO NOT use if you have an allergy to CHG or antibacterial soaps.  If your skin becomes reddened/irritated stop using the CHG and inform your nurse when you arrive at Short Stay. Do not shave (including legs and underarms) for at least 48 hours prior to the first CHG shower.  You may shave your face/neck. Please follow these instructions carefully:  1.  Shower with CHG Soap the night before surgery and the  morning of Surgery.  2.  If you choose to wash your hair, wash your hair first as usual with your  normal  shampoo.  3.  After you shampoo, rinse your hair and body thoroughly to remove the  shampoo.                           4.  Use CHG as you would any other liquid soap.  You can apply chg directly  to the skin and wash                       Gently with a scrungie or clean washcloth.  5.  Apply the CHG Soap to your body ONLY FROM THE NECK DOWN.   Do not use on face/ open                           Wound or open sores. Avoid contact with eyes, ears mouth and genitals (private parts).                       Wash face,  Genitals (private parts) with  your normal soap.             6.  Wash thoroughly, paying special attention to the area where your surgery  will be performed.  7.  Thoroughly rinse your body with warm water from the neck down.  8.  DO NOT shower/wash with your normal soap after using and rinsing off  the CHG Soap.                9.  Pat yourself dry with a clean towel.            10.  Wear clean pajamas.            11.  Place clean sheets on your bed the night of your first shower and do not  sleep with pets. Day of Surgery : Do not apply any lotions/deodorants the morning of surgery.  Please wear clean clothes to the hospital/surgery center.  FAILURE TO FOLLOW THESE INSTRUCTIONS MAY RESULT IN THE CANCELLATION OF YOUR SURGERY PATIENT SIGNATURE_________________________________  NURSE SIGNATURE__________________________________  ________________________________________________________________________   Lindsay [name]  An incentive spirometer is a tool that can help keep your lungs clear and active. This tool measures how well you are filling your lungs with each breath. Taking long deep breaths may help reverse or decrease the chance of developing breathing (pulmonary) problems (especially infection) following:  A long period of time when you are unable to move or be active. BEFORE THE PROCEDURE   If the spirometer includes an indicator to show your best effort, your nurse or respiratory therapist will set it to a desired goal.  If possible, sit up straight or lean slightly forward. Try not to slouch.  Hold the  incentive spirometer in an upright position. INSTRUCTIONS FOR USE  1. Sit on the edge of your bed if possible, or sit up as far as you can in bed or on a chair. 2. Hold the incentive spirometer in an upright position. 3. Breathe out normally. 4. Place the mouthpiece in your mouth and seal your lips tightly around it. 5. Breathe in slowly and as deeply as possible, raising the piston or the ball toward  the top of the column. 6. Hold your breath for 3-5 seconds or for as long as possible. Allow the piston or ball to fall to the bottom of the column. 7. Remove the mouthpiece from your mouth and breathe out normally. 8. Rest for a few seconds and repeat Steps 1 through 7 at least 10 times every 1-2 hours when you are awake. Take your time and take a few normal breaths between deep breaths. 9. The spirometer may include an indicator to show your best effort. Use the indicator as a goal to work toward during each repetition. 10. After each set of 10 deep breaths, practice coughing to be sure your lungs are clear. If you have an incision (the cut made at the time of surgery), support your incision when coughing by placing a pillow or rolled up towels firmly against it. Once you are able to get out of bed, walk around indoors and cough well. You may stop using the incentive spirometer when instructed by your caregiver.  RISKS AND COMPLICATIONS  Take your time so you do not get dizzy or light-headed.  If you are in pain, you may need to take or ask for pain medication before doing incentive spirometry. It is harder to take a deep breath if you are having pain. AFTER USE  Rest and breathe slowly and easily.  It can be helpful to keep track of a log of your progress. Your caregiver can provide you with a simple table to help with this. If you are using the spirometer at home, follow these instructions: SEEK MEDICAL CARE IF:   You are having difficultly using the spirometer.  You have trouble using the spirometer as often as instructed.  Your pain medication is not giving enough relief while using the spirometer.  You develop fever of 100.5 F (38.1 C) or higher. SEEK IMMEDIATE MEDICAL CARE IF:   You cough up bloody sputum that had not been present before.  You develop fever of 102 F (38.9 C) or greater.  You develop worsening pain at or near the incision site. MAKE SURE YOU:    Understand these instructions.  Will watch your condition.  Will get help right away if you are not doing well or get worse. Document Released: 05/05/2006 Document Revised: 03/17/2011 Document Reviewed: 07/06/2006 [facility] Patient Information 2014 [place], [place].   ________________________________________________________________________

## 2019-09-19 NOTE — Care Plan (Signed)
Ortho Bundle Case Management Note  Patient Details  Name: Lindsay [name] MRN: [id number] Date of Birth: 07/28/1945   Spoke with patient several times prior to surgery. She will discharge to her daughter's home -  [address], [place]. She can be reached on her cell. [phone]. She has a walker, CPM ordered from [employer]. HHPT referral to [facility] home care and OPPT set up with [facility]  Patient and MD in agreement with plan. Choice offered                  DME Arranged:  CPM DME Agency:  [employer]  HH Arranged:  PT HH Agency:  [facility]  Additional Comments: Please contact me with any questions of if this plan should need to change.  Shauna Hugh,  RN,BSN,MHA,CCM  [facility]opaedic Specialist  [phone] 09/19/2019, 4:38 PM

## 2019-09-19 NOTE — Progress Notes (Signed)
DUE TO COVID-19 ONLY ONE VISITOR IS ALLOWED TO COME WITH YOU AND STAY IN THE WAITING ROOM ONLY DURING PRE OP AND PROCEDURE DAY OF SURGERY. THE 1 VISITOR  MAY VISIT WITH YOU AFTER SURGERY IN YOUR PRIVATE ROOM DURING VISITING HOURS ONLY!  YOU NEED TO HAVE A COVID 19 TEST ON__9/21/21_____ @_______ , THIS TEST MUST BE DONE BEFORE SURGERY,  COVID TESTING SITE [address] [place] , IT IS ON THE RIGHT GOING OUT [address] APPROXIMATELY  2 MINUTES PAST [employer] ON THE RIGHT. ONCE YOUR COVID TEST IS COMPLETED,  PLEASE BEGIN THE QUARANTINE INSTRUCTIONS AS OUTLINED IN YOUR HANDOUT.                Lindsay [name]  09/19/2019   Your procedure is scheduled on:09/30/19    Report to [facility] Main  Entrance   Report to admitting at    [phone]     Call this number if you have problems the morning of surgery [phone]    REMEMBER: NO  SOLID FOOD CANDY OR GUM AFTER MIDNIGHT. CLEAR LIQUIDS UNTIL   0415am        . NOTHING BY MOUTH EXCEPT CLEAR LIQUIDS UNTIL    . PLEASE FINISH ENSURE DRINK PER SURGEON ORDER  WHICH NEEDS TO BE COMPLETED AT    0415am  .      CLEAR LIQUID DIET   Foods Allowed                                                                    Coffee and tea, regular and decaf                            Fruit ices (not with fruit pulp)                                      Iced Popsicles                                    Carbonated beverages, regular and diet                                    Cranberry, grape and apple juices Sports drinks like Gatorade Lightly seasoned clear broth or consume(fat free) Sugar, honey syrup ___________________________________________________________________      BRUSH YOUR TEETH MORNING OF SURGERY AND RINSE YOUR MOUTH OUT, NO CHEWING GUM CANDY OR MINTS.     Take these medicines the morning of surgery with A SIP OF WATER:  Inhalers as usual and bring, Zyrtec, protonix, ditropan , synthroid  DO NOT TAKE  ANY DIABETIC MEDICATIONS DAY OF YOUR SURGERY                               You may not have any metal on your body including hair pins and  piercings  Do not wear jewelry, make-up, lotions, powders or perfumes, deodorant             Do not wear nail polish on your fingernails.  Do not shave  48 hours prior to surgery.              Men may shave face and neck.   Do not bring valuables to the hospital.  IS NOT             RESPONSIBLE   FOR VALUABLES.  Contacts, dentures or bridgework may not be worn into surgery.  Leave suitcase in the car. After surgery it may be brought to your room.     Patients discharged the day of surgery will not be allowed to drive home. IF YOU ARE HAVING SURGERY AND GOING HOME THE SAME DAY, YOU MUST HAVE AN ADULT TO DRIVE YOU HOME AND BE WITH YOU FOR 24 HOURS. YOU MAY GO HOME BY TAXI OR UBER OR ORTHERWISE, BUT AN ADULT MUST ACCOMPANY YOU HOME AND STAY WITH YOU FOR 24 HOURS.  Name and phone number of your driver:               Please read over the following fact sheets you were given: _____________________________________________________________________  [facility] - Preparing for Surgery Before surgery, you can play an important role.  Because skin is not sterile, your skin needs to be as free of germs as possible.  You can reduce the number of germs on your skin by washing with CHG (chlorahexidine gluconate) soap before surgery.  CHG is an antiseptic cleaner which kills germs and bonds with the skin to continue killing germs even after washing. Please DO NOT use if you have an allergy to CHG or antibacterial soaps.  If your skin becomes reddened/irritated stop using the CHG and inform your nurse when you arrive at Short Stay. Do not shave (including legs and underarms) for at least 48 hours prior to the first CHG shower.  You may shave your face/neck. Please follow these instructions carefully:  1.  Shower with CHG Soap the night before  surgery and the  morning of Surgery.  2.  If you choose to wash your hair, wash your hair first as usual with your  normal  shampoo.  3.  After you shampoo, rinse your hair and body thoroughly to remove the  shampoo.                           4.  Use CHG as you would any other liquid soap.  You can apply chg directly  to the skin and wash                       Gently with a scrungie or clean washcloth.  5.  Apply the CHG Soap to your body ONLY FROM THE NECK DOWN.   Do not use on face/ open                           Wound or open sores. Avoid contact with eyes, ears mouth and genitals (private parts).                       Wash face,  Genitals (private parts) with your normal soap.             6.  Wash thoroughly,  paying special attention to the area where your surgery  will be performed.  7.  Thoroughly rinse your body with warm water from the neck down.  8.  DO NOT shower/wash with your normal soap after using and rinsing off  the CHG Soap.                9.  Pat yourself dry with a clean towel.            10.  Wear clean pajamas.            11.  Place clean sheets on your bed the night of your first shower and do not  sleep with pets. Day of Surgery : Do not apply any lotions/deodorants the morning of surgery.  Please wear clean clothes to the hospital/surgery center.  FAILURE TO FOLLOW THESE INSTRUCTIONS MAY RESULT IN THE CANCELLATION OF YOUR SURGERY PATIENT SIGNATURE_________________________________  NURSE SIGNATURE__________________________________  ________________________________________________________________________

## 2019-09-20 ENCOUNTER — Encounter (HOSPITAL_COMMUNITY)
Admission: RE | Admit: 2019-09-20 | Discharge: 2019-09-20 | Disposition: A | Discharge status: Home / Self Care | Payer: Medicare Other | Source: Ambulatory Visit | Attending: Orthopedic Surgery | Admitting: Orthopedic Surgery

## 2019-09-21 ENCOUNTER — Encounter (HOSPITAL_COMMUNITY): Payer: Self-pay

## 2019-09-21 ENCOUNTER — Other Ambulatory Visit: Payer: Self-pay

## 2019-09-21 ENCOUNTER — Encounter (HOSPITAL_COMMUNITY)
Admission: RE | Admit: 2019-09-21 | Discharge: 2019-09-21 | Disposition: A | Discharge status: Home / Self Care | Payer: Medicare Other | Source: Ambulatory Visit | Attending: Orthopedic Surgery | Admitting: Orthopedic Surgery

## 2019-09-21 DIAGNOSIS — Z01810 Encounter for preprocedural cardiovascular examination: Secondary | ICD-10-CM | POA: Diagnosis not present

## 2019-09-21 DIAGNOSIS — Z01812 Encounter for preprocedural laboratory examination: Secondary | ICD-10-CM | POA: Insufficient documentation

## 2019-09-21 HISTORY — DX: Hypothyroidism, unspecified: E03.9

## 2019-09-21 HISTORY — DX: Unspecified osteoarthritis, unspecified site: M19.90

## 2019-09-21 HISTORY — DX: Nausea with vomiting, unspecified: R11.2

## 2019-09-21 HISTORY — DX: Anemia, unspecified: D64.9

## 2019-09-21 HISTORY — DX: Type 2 diabetes mellitus without complications: E11.9

## 2019-09-21 HISTORY — DX: Other specified postprocedural states: Z98.890

## 2019-09-21 HISTORY — DX: Essential (primary) hypertension: I10

## 2019-09-21 LAB — SURGICAL PCR SCREEN
MRSA, PCR: NEGATIVE
Staphylococcus aureus: NEGATIVE

## 2019-09-21 LAB — COMPREHENSIVE METABOLIC PANEL
ALT: 10 U/L (ref 0–44)
AST: 14 U/L — ABNORMAL LOW (ref 15–41)
Albumin: 4 g/dL (ref 3.5–5.0)
Alkaline Phosphatase: 52 U/L (ref 38–126)
Anion gap: 12 (ref 5–15)
BUN: 22 mg/dL (ref 8–23)
CO2: 25 mmol/L (ref 22–32)
Calcium: 9.8 mg/dL (ref 8.9–10.3)
Chloride: 102 mmol/L (ref 98–111)
Creatinine, Ser: 0.84 mg/dL (ref 0.44–1.00)
GFR calc Af Amer: >60 mL/min (ref >60)
GFR calc non Af Amer: >60 mL/min (ref >60)
Glucose, Bld: 80 mg/dL (ref 70–99)
Potassium: 4.2 mmol/L (ref 3.5–5.1)
Sodium: 139 mmol/L (ref 135–145)
Total Bilirubin: 0.5 mg/dL (ref 0.3–1.2)
Total Protein: 7.6 g/dL (ref 6.5–8.1)

## 2019-09-21 LAB — PROTIME-INR
INR: 1 (ref 0.8–1.2)
Prothrombin Time: 12.9 seconds (ref 11.4–15.2)

## 2019-09-21 LAB — CBC WITH DIFFERENTIAL/PLATELET
Abs Immature Granulocytes: 0.04 10*3/uL (ref 0.00–0.07)
Basophils Absolute: 0.1 10*3/uL (ref 0.0–0.1)
Basophils Relative: 1 %
Eosinophils Absolute: 0.4 10*3/uL (ref 0.0–0.5)
Eosinophils Relative: 4 %
HCT: 33.8 % — ABNORMAL LOW (ref 36.0–46.0)
Hemoglobin: 10.7 g/dL — ABNORMAL LOW (ref 12.0–15.0)
Immature Granulocytes: 0 %
Lymphocytes Relative: 27 %
Lymphs Abs: 2.6 10*3/uL (ref 0.7–4.0)
MCH: 26.4 pg (ref 26.0–34.0)
MCHC: 31.7 g/dL (ref 30.0–36.0)
MCV: 83.5 fL (ref 80.0–100.0)
Monocytes Absolute: 0.6 10*3/uL (ref 0.1–1.0)
Monocytes Relative: 7 %
Neutro Abs: 5.9 10*3/uL (ref 1.7–7.7)
Neutrophils Relative %: 61 %
Platelets: 381 10*3/uL (ref 150–400)
RBC: 4.05 MIL/uL (ref 3.87–5.11)
RDW: 14.4 % (ref 11.5–15.5)
WBC: 9.6 10*3/uL (ref 4.0–10.5)
nRBC: 0 % (ref 0.0–0.2)

## 2019-09-21 LAB — URINALYSIS, ROUTINE W REFLEX MICROSCOPIC
Bilirubin Urine: NEGATIVE
Glucose, UA: NEGATIVE mg/dL
Hgb urine dipstick: NEGATIVE
Ketones, ur: NEGATIVE mg/dL
Nitrite: NEGATIVE
Protein, ur: NEGATIVE mg/dL
Specific Gravity, Urine: 1.012 (ref 1.005–1.030)
pH: 6 (ref 5.0–8.0)

## 2019-09-21 LAB — APTT: aPTT: 36 seconds (ref 24–36)

## 2019-09-21 NOTE — Progress Notes (Addendum)
Anesthesia Review:  PCP:DR Ria Clock LOV- 8/21 p[er pt 09/02/19  Cardiologist :  09/05/19- Pulmonary clearance- 09/05/19- Ames Dura ,NP-  Chest x-ray : 8/130/21-epic  EKG : 09/21/19  Echo : Stress test: Cardiac Cath :  Activity level: no  Sleep Study/ CPAP : Fasting Blood Sugar :      / Checks Blood Sugar -- times a day:   Blood Thinner/ Instructions /Last Dose: ASA / Instructions/ Last Dose :  hgba1c- 5.9=epic- 09/02/19  Asa - 81 mg - pt has not been given instsructions  U/A and CBC  done 9/15 21 routed via epic to DR Jodi Geralds.

## 2019-09-21 NOTE — Progress Notes (Signed)
Anesthesia Review:  PCP: DR Ria Clock Lov 8/21 per pt  Cardiologist : Chest x-ray :09/05/19-epic  EKG : 09/21/19-epic  Echo : Stress test: Cardiac Cath :  Activity level: no  Sleep Study/ CPAP :no  Fasting Blood Sugar :      / Checks Blood Sugar -- times a day:   Blood Thinner/ Instructions /Last Dose: ASA / Instructions/ Last Dose :  81 mg ASA - pt has not been given instructions  09/02/19-hgba1c-5.9-epic  DM- type 2

## 2019-09-21 NOTE — Progress Notes (Signed)
MORNING OF SURGERY DRINK:   DRINK 1 G2 drink BEFORE YOU LEAVE HOME, DRINK ALL OF THE  G2 DRINK AT ONE TIME.   NO SOLID FOOD AFTER 600 PM THE NIGHT BEFORE YOUR SURGERY. YOU MAY DRINK CLEAR FLUIDS. THE G2 DRINK YOU DRINK BEFORE YOU LEAVE HOME WILL BE THE LAST FLUIDS YOU DRINK BEFORE SURGERY.  PAIN IS EXPECTED AFTER SURGERY AND WILL NOT BE COMPLETELY ELIMINATED. AMBULATION AND TYLENOL WILL HELP REDUCE INCISIONAL AND GAS PAIN. MOVEMENT IS KEY!  YOU ARE EXPECTED TO BE OUT OF BED WITHIN 4 HOURS OF ADMISSION TO YOUR PATIENT ROOM.  SITTING IN THE RECLINER THROUGHOUT THE DAY IS IMPORTANT FOR DRINKING FLUIDS AND MOVING GAS THROUGHOUT THE GI TRACT.  COMPRESSION STOCKINGS SHOULD BE WORN THROUGHOUT YOUR HOSPITAL STAY UNLESS YOU ARE WALKING.   INCENTIVE SPIROMETER SHOULD BE USED EVERY HOUR WHILE AWAKE TO DECREASE POST-OPERATIVE COMPLICATIONS SUCH AS PNEUMONIA.  WHEN DISCHARGED HOME, IT IS IMPORTANT TO CONTINUE TO WALK EVERY HOUR AND USE THE INCENTIVE SPIROMETER EVERY HOUR.        MORNING OF SURGERY DRINK:   DRINK 1 G2 drink BEFORE YOU LEAVE HOME, DRINK ALL OF THE  G2 DRINK AT ONE TIME.   NO SOLID FOOD AFTER 600 PM THE NIGHT BEFORE YOUR SURGERY. YOU MAY DRINK CLEAR FLUIDS. THE G2 DRINK YOU DRINK BEFORE YOU LEAVE HOME WILL BE THE LAST FLUIDS YOU DRINK BEFORE SURGERY.  PAIN IS EXPECTED AFTER SURGERY AND WILL NOT BE COMPLETELY ELIMINATED. AMBULATION AND TYLENOL WILL HELP REDUCE INCISIONAL AND GAS PAIN. MOVEMENT IS KEY!  YOU ARE EXPECTED TO BE OUT OF BED WITHIN 4 HOURS OF ADMISSION TO YOUR PATIENT ROOM.  SITTING IN THE RECLINER THROUGHOUT THE DAY IS IMPORTANT FOR DRINKING FLUIDS AND MOVING GAS THROUGHOUT THE GI TRACT.  COMPRESSION STOCKINGS SHOULD BE WORN THROUGHOUT YOUR HOSPITAL STAY UNLESS YOU ARE WALKING.   INCENTIVE SPIROMETER SHOULD BE USED EVERY HOUR WHILE AWAKE TO DECREASE POST-OPERATIVE COMPLICATIONS SUCH AS PNEUMONIA.  WHEN DISCHARGED HOME, IT IS IMPORTANT TO CONTINUE TO WALK EVERY HOUR AND USE  THE INCENTIVE SPIROMETER EVERY HOUR.         

## 2019-09-23 NOTE — Progress Notes (Signed)
Anesthesia Chart Review   Case: [id number] Date/Time: 09/30/19 0700   Procedure: TOTAL KNEE ARTHROPLASTY (Right Knee)   Anesthesia type: Spinal   Pre-op diagnosis: RIGHT KNEE DEGENERATIVE JOINT DISEASE   Location: [facility] ROOM 09 / [facility]   Surgeons: Jodi Geralds, MD      DISCUSSION:74 y.o. former smoker (10 pack years, quit 01/06/97) with h/o HTN,  PONV, cough variant asthma, DM II, hypothyroidism, right knee djd scheduled for above procedure 09/30/2019 with Dr. Jodi Geralds.   Pt last seen by pulmonology 09/05/2019.  Per OV note, "Cleared from pulmonary standpoint for right total knee replacement with Dr. Luiz Blare. She is low risk for pulmonary complications d/t risk stratification. Checking CXR today. She is to use prescribed inhalers day of surgery and bring them with her. Patient to notify office if she develops any acute exacerbation symptoms between now and time of her surgery. Encourage early ambulation and use of IS.  Recommend 1. Short duration of surgery as much as possible and avoid paralytic if possible 2. Recovery in step down or ICU with Pulmonary consultation 3. DVT prophylaxis 4. Aggressive pulmonary toilet with o2, bronchodilatation, and incentive spirometry and early ambulation"  Last seen by PCP 09/02/2019.   Anticipate pt can proceed with planned procedure barring acute status change.   VS: BP 134/86   Pulse 83   Temp 37.3 C (Oral)   SpO2 100%   PROVIDERS: Olive Bass, FNP is PCP   Sherene Sires, MD is Pulmonologist  LABS: Labs reviewed: Acceptable for surgery. (all labs ordered are listed, but only abnormal results are displayed)  Labs Reviewed  CBC WITH DIFFERENTIAL/PLATELET - Abnormal; Notable for the following components:      Result Value   Hemoglobin 10.7 (*)    HCT 33.8 (*)    All other components within normal limits  COMPREHENSIVE METABOLIC PANEL - Abnormal; Notable for the following components:   AST 14 (*)    All other components within normal limits   URINALYSIS, ROUTINE W REFLEX MICROSCOPIC - Abnormal; Notable for the following components:   Leukocytes,Ua TRACE (*)    Bacteria, UA RARE (*)    All other components within normal limits  SURGICAL PCR SCREEN  APTT  PROTIME-INR  TYPE AND SCREEN     IMAGES:   EKG: 09/21/2019 Rate 76 bpm  NSR with sinus arrhythmia  CV:  Past Medical History:  Diagnosis Date  . Acid reflux   . Anemia   . Arthritis   . Asthma   . COPD (chronic obstructive pulmonary disease) (HCC)   . Diabetes mellitus without complication (HCC)    TYPE 2   . Hyperglycemia   . Hypertension   . Hypokalemia   . Hypothyroidism   . Obesity   . PONV (postoperative nausea and vomiting)   . Spinal stenosis   . Urinary tract infection, chronic     Past Surgical History:  Procedure Laterality Date  . ABDOMINAL HYSTERECTOMY    . ANKLE FUSION Left   . CHOLECYSTECTOMY      MEDICATIONS: . albuterol (VENTOLIN HFA) 108 (90 Base) MCG/ACT inhaler  . aspirin EC 81 MG tablet  . budesonide-formoterol (SYMBICORT) 80-4.5 MCG/ACT inhaler  . cetirizine (ZYRTEC) 10 MG tablet  . Cholecalciferol (VITAMIN D3) 50 MCG (2000 UT) capsule  . cyclobenzaprine (FLEXERIL) 10 MG tablet  . docusate sodium (COLACE) 100 MG capsule  . hydrOXYzine (ATARAX/VISTARIL) 10 MG tablet  . levothyroxine (SYNTHROID) 75 MCG tablet  . losartan (COZAAR) 50 MG tablet  .  Menthol-Methyl Salicylate (SALONPAS PAIN RELIEF PATCH EX)  . metFORMIN (GLUCOPHAGE) 500 MG tablet  . montelukast (SINGULAIR) 10 MG tablet  . NON FORMULARY  . oxybutynin (DITROPAN) 5 MG tablet  . pantoprazole (PROTONIX) 20 MG tablet  . polyvinyl alcohol (LIQUIFILM TEARS) 1.4 % ophthalmic solution  . simvastatin (ZOCOR) 40 MG tablet  . traMADol (ULTRAM) 50 MG tablet  . trimethoprim (TRIMPEX) 100 MG tablet   No current facility-administered medications for this encounter.   Jodell Cipro, PA-C [facility] Pre-Surgical Testing [phone]

## 2019-09-27 ENCOUNTER — Other Ambulatory Visit (HOSPITAL_COMMUNITY)
Admission: RE | Admit: 2019-09-27 | Discharge: 2019-09-27 | Disposition: A | Discharge status: Home / Self Care | Payer: Medicare Other | Source: Ambulatory Visit | Attending: Orthopedic Surgery | Admitting: Orthopedic Surgery

## 2019-09-27 DIAGNOSIS — Z01812 Encounter for preprocedural laboratory examination: Secondary | ICD-10-CM | POA: Insufficient documentation

## 2019-09-27 DIAGNOSIS — Z20822 Contact with and (suspected) exposure to covid-19: Secondary | ICD-10-CM | POA: Diagnosis not present

## 2019-09-27 LAB — SARS CORONAVIRUS 2 (TAT 6-24 HRS): SARS Coronavirus 2: NEGATIVE

## 2019-09-29 MED ORDER — BUPIVACAINE LIPOSOME 1.3 % IJ SUSP
20.0000 mL | Freq: Once | INTRAMUSCULAR | Status: DC
  Filled 2019-09-29: qty 20

## 2019-09-29 NOTE — Anesthesia Preprocedure Evaluation (Addendum)
Anesthesia Evaluation  Patient identified by MRN, date of birth, ID band  Reviewed: Allergy & Precautions, NPO status , Patient's Chart, lab work & pertinent test results  History of Anesthesia Complications (+) PONV  Airway Mallampati: II  TM Distance: >3 FB Neck ROM: Full    Dental no notable dental hx. (+) Teeth Intact, Dental Advisory Given   Pulmonary COPD, former smoker,    Pulmonary exam normal breath sounds clear to auscultation       Cardiovascular Exercise Tolerance: Good hypertension, Pt. on medications negative cardio ROS Normal cardiovascular exam Rhythm:Regular Rate:Normal     Neuro/Psych negative neurological ROS  negative psych ROS   GI/Hepatic negative GI ROS, Neg liver ROS, GERD  ,  Endo/Other  diabetes, Well Controlled, Type 2, Oral Hypoglycemic AgentsHypothyroidism   Renal/GU negative Renal ROSK+ 4.2 Cr 0.84     Musculoskeletal  (+) Arthritis ,   Abdominal (+) + obese,   Peds  Hematology  (+) Blood dyscrasia, anemia , Hgb 10.7 Plt 381   Anesthesia Other Findings   Reproductive/Obstetrics                           Anesthesia Physical Anesthesia Plan  ASA: III  Anesthesia Plan: General   Post-op Pain Management:  Regional for Post-op pain   Induction:   PONV Risk Score and Plan: 3 and Treatment may vary due to age or medical condition, Midazolam, Ondansetron and Dexamethasone  Airway Management Planned: Oral ETT  Additional Equipment: None  Intra-op Plan:   Post-operative Plan: Extubation in OR  Informed Consent: I have reviewed the patients History and Physical, chart, labs and discussed the procedure including the risks, benefits and alternatives for the proposed anesthesia with the patient or authorized representative who has indicated his/her understanding and acceptance.     Dental advisory given  Plan Discussed with: CRNA and  Anesthesiologist  Anesthesia Plan Comments: (GA R Adductor canal block. Pt w Spinal stenosis block abandoned )     Anesthesia Quick Evaluation

## 2019-09-30 ENCOUNTER — Ambulatory Visit (HOSPITAL_COMMUNITY): Payer: Medicare Other | Admitting: Anesthesiology

## 2019-09-30 ENCOUNTER — Ambulatory Visit (HOSPITAL_COMMUNITY)
Admission: RE | Admit: 2019-09-30 | Discharge: 2019-09-30 | Disposition: A | Discharge status: Home / Self Care | Payer: Medicare Other | Attending: Orthopedic Surgery | Admitting: Orthopedic Surgery

## 2019-09-30 ENCOUNTER — Encounter (HOSPITAL_COMMUNITY)
Admission: RE | Disposition: A | Discharge status: Home / Self Care | Payer: Self-pay | Source: Home / Self Care | Attending: Orthopedic Surgery

## 2019-09-30 ENCOUNTER — Ambulatory Visit (HOSPITAL_COMMUNITY): Payer: Medicare Other | Admitting: Physician Assistant

## 2019-09-30 ENCOUNTER — Encounter (HOSPITAL_COMMUNITY): Payer: Self-pay | Admitting: Orthopedic Surgery

## 2019-09-30 DIAGNOSIS — X58XXXA Exposure to other specified factors, initial encounter: Secondary | ICD-10-CM | POA: Diagnosis not present

## 2019-09-30 DIAGNOSIS — J449 Chronic obstructive pulmonary disease, unspecified: Secondary | ICD-10-CM | POA: Insufficient documentation

## 2019-09-30 DIAGNOSIS — I10 Essential (primary) hypertension: Secondary | ICD-10-CM | POA: Diagnosis not present

## 2019-09-30 DIAGNOSIS — S76191A Other specified injury of right quadriceps muscle, fascia and tendon, initial encounter: Secondary | ICD-10-CM | POA: Diagnosis not present

## 2019-09-30 DIAGNOSIS — Z6833 Body mass index (BMI) 33.0-33.9, adult: Secondary | ICD-10-CM | POA: Insufficient documentation

## 2019-09-30 DIAGNOSIS — Z96651 Presence of right artificial knee joint: Secondary | ICD-10-CM | POA: Diagnosis not present

## 2019-09-30 DIAGNOSIS — E119 Type 2 diabetes mellitus without complications: Secondary | ICD-10-CM | POA: Diagnosis not present

## 2019-09-30 DIAGNOSIS — Z01811 Encounter for preprocedural respiratory examination: Secondary | ICD-10-CM

## 2019-09-30 DIAGNOSIS — G8918 Other acute postprocedural pain: Secondary | ICD-10-CM | POA: Diagnosis not present

## 2019-09-30 DIAGNOSIS — J441 Chronic obstructive pulmonary disease with (acute) exacerbation: Secondary | ICD-10-CM | POA: Diagnosis not present

## 2019-09-30 DIAGNOSIS — M1711 Unilateral primary osteoarthritis, right knee: Secondary | ICD-10-CM | POA: Diagnosis not present

## 2019-09-30 DIAGNOSIS — Z87891 Personal history of nicotine dependence: Secondary | ICD-10-CM | POA: Insufficient documentation

## 2019-09-30 DIAGNOSIS — Y838 Other surgical procedures as the cause of abnormal reaction of the patient, or of later complication, without mention of misadventure at the time of the procedure: Secondary | ICD-10-CM | POA: Insufficient documentation

## 2019-09-30 DIAGNOSIS — S86811A Strain of other muscle(s) and tendon(s) at lower leg level, right leg, initial encounter: Secondary | ICD-10-CM | POA: Diagnosis not present

## 2019-09-30 DIAGNOSIS — M9689 Other intraoperative and postprocedural complications and disorders of the musculoskeletal system: Secondary | ICD-10-CM | POA: Insufficient documentation

## 2019-09-30 DIAGNOSIS — E669 Obesity, unspecified: Secondary | ICD-10-CM | POA: Diagnosis not present

## 2019-09-30 HISTORY — PX: TOTAL KNEE ARTHROPLASTY: SHX125

## 2019-09-30 LAB — TYPE AND SCREEN
ABO/RH(D): A POS
Antibody Screen: NEGATIVE

## 2019-09-30 LAB — GLUCOSE, CAPILLARY: Glucose-Capillary: 92 mg/dL (ref 70–99)

## 2019-09-30 LAB — ABO/RH: ABO/RH(D): A POS

## 2019-09-30 SURGERY — ARTHROPLASTY, KNEE, TOTAL
Anesthesia: Spinal | Site: Knee | Laterality: Right

## 2019-09-30 MED ORDER — PHENYLEPHRINE HCL-NACL 10-0.9 MG/250ML-% IV SOLN
INTRAVENOUS | Status: DC | PRN
  Administered 2019-09-30: 25 ug/min via INTRAVENOUS

## 2019-09-30 MED ORDER — HYDROMORPHONE HCL 1 MG/ML IJ SOLN
INTRAMUSCULAR | Status: DC | PRN
  Administered 2019-09-30: 1 mg via INTRAVENOUS

## 2019-09-30 MED ORDER — DEXAMETHASONE SODIUM PHOSPHATE 10 MG/ML IJ SOLN
INTRAMUSCULAR | Status: DC | PRN
  Administered 2019-09-30: 10 mg via INTRAVENOUS

## 2019-09-30 MED ORDER — ROPIVACAINE HCL 7.5 MG/ML IJ SOLN
INTRAMUSCULAR | Status: DC | PRN
  Administered 2019-09-30: 20 mL via PERINEURAL

## 2019-09-30 MED ORDER — CEFAZOLIN SODIUM-DEXTROSE 2-4 GM/100ML-% IV SOLN: 2.0000 g | Freq: Once | INTRAVENOUS | Status: DC

## 2019-09-30 MED ORDER — PHENYLEPHRINE HCL (PRESSORS) 10 MG/ML IV SOLN
INTRAVENOUS | Status: AC
  Filled 2019-09-30: qty 1

## 2019-09-30 MED ORDER — ROPIVACAINE HCL 7.5 MG/ML IJ SOLN: INTRAMUSCULAR | Status: DC | PRN

## 2019-09-30 MED ORDER — PHENYLEPHRINE 40 MCG/ML (10ML) SYRINGE FOR IV PUSH (FOR BLOOD PRESSURE SUPPORT)
PREFILLED_SYRINGE | INTRAVENOUS | Status: DC | PRN
  Administered 2019-09-30 (×3): 80 ug via INTRAVENOUS

## 2019-09-30 MED ORDER — FENTANYL CITRATE (PF) 100 MCG/2ML IJ SOLN
INTRAMUSCULAR | Status: AC
  Filled 2019-09-30: qty 2

## 2019-09-30 MED ORDER — OXYCODONE-ACETAMINOPHEN 5-325 MG PO TABS: 1.0000 | ORAL_TABLET | Freq: Four times a day (QID) | ORAL | 0 refills | Status: DC | PRN

## 2019-09-30 MED ORDER — TRANEXAMIC ACID-NACL 1000-0.7 MG/100ML-% IV SOLN
1000.0000 mg | Freq: Once | INTRAVENOUS | Status: AC
  Administered 2019-09-30: 1000 mg via INTRAVENOUS

## 2019-09-30 MED ORDER — DEXAMETHASONE SODIUM PHOSPHATE 10 MG/ML IJ SOLN
INTRAMUSCULAR | Status: AC
  Filled 2019-09-30: qty 1

## 2019-09-30 MED ORDER — CELECOXIB 100 MG PO CAPS: 100.0000 mg | ORAL_CAPSULE | Freq: Two times a day (BID) | ORAL | 0 refills | Status: AC

## 2019-09-30 MED ORDER — ASPIRIN EC 325 MG PO TBEC: 325.0000 mg | DELAYED_RELEASE_TABLET | Freq: Two times a day (BID) | ORAL | 0 refills | Status: AC

## 2019-09-30 MED ORDER — LIDOCAINE 2% (20 MG/ML) 5 ML SYRINGE
INTRAMUSCULAR | Status: AC
  Filled 2019-09-30: qty 5

## 2019-09-30 MED ORDER — PROPOFOL 1000 MG/100ML IV EMUL
INTRAVENOUS | Status: AC
  Filled 2019-09-30: qty 200

## 2019-09-30 MED ORDER — AMISULPRIDE (ANTIEMETIC) 5 MG/2ML IV SOLN
INTRAVENOUS | Status: AC
  Filled 2019-09-30: qty 4

## 2019-09-30 MED ORDER — FENTANYL CITRATE (PF) 100 MCG/2ML IJ SOLN
INTRAMUSCULAR | Status: DC | PRN
Start: 2019-09-30 — End: 2019-09-30
  Administered 2019-09-30: 100 ug via INTRAVENOUS
  Administered 2019-09-30 (×4): 50 ug via INTRAVENOUS

## 2019-09-30 MED ORDER — FENTANYL CITRATE (PF) 250 MCG/5ML IJ SOLN
INTRAMUSCULAR | Status: AC
  Filled 2019-09-30: qty 5

## 2019-09-30 MED ORDER — TRANEXAMIC ACID-NACL 1000-0.7 MG/100ML-% IV SOLN
1000.0000 mg | INTRAVENOUS | Status: AC
  Administered 2019-09-30: 1000 mg via INTRAVENOUS
  Filled 2019-09-30: qty 100

## 2019-09-30 MED ORDER — SODIUM CHLORIDE 0.9 % IR SOLN
Status: DC | PRN
  Administered 2019-09-30: 1000 mL

## 2019-09-30 MED ORDER — SODIUM CHLORIDE 0.9% FLUSH
INTRAVENOUS | Status: DC | PRN
  Administered 2019-09-30: 50 mL

## 2019-09-30 MED ORDER — CEFAZOLIN SODIUM-DEXTROSE 2-4 GM/100ML-% IV SOLN
2.0000 g | INTRAVENOUS | Status: AC
  Administered 2019-09-30: 2 g via INTRAVENOUS
  Filled 2019-09-30: qty 100

## 2019-09-30 MED ORDER — PHENYLEPHRINE HCL (PRESSORS) 10 MG/ML IV SOLN
INTRAVENOUS | Status: AC
  Filled 2019-09-30: qty 2

## 2019-09-30 MED ORDER — ONDANSETRON HCL 4 MG/2ML IJ SOLN
4.0000 mg | Freq: Once | INTRAMUSCULAR | Status: AC | PRN
  Administered 2019-09-30: 4 mg via INTRAVENOUS

## 2019-09-30 MED ORDER — PROPOFOL 10 MG/ML IV BOLUS
INTRAVENOUS | Status: DC | PRN
  Administered 2019-09-30: 140 mg via INTRAVENOUS

## 2019-09-30 MED ORDER — PROPOFOL 10 MG/ML IV BOLUS
INTRAVENOUS | Status: AC
  Filled 2019-09-30: qty 20

## 2019-09-30 MED ORDER — ONDANSETRON HCL 4 MG/2ML IJ SOLN
INTRAMUSCULAR | Status: AC
  Filled 2019-09-30: qty 2

## 2019-09-30 MED ORDER — MIDAZOLAM HCL 2 MG/2ML IJ SOLN
INTRAMUSCULAR | Status: AC
  Filled 2019-09-30: qty 2

## 2019-09-30 MED ORDER — CLONIDINE HCL (ANALGESIA) 100 MCG/ML EP SOLN
EPIDURAL | Status: DC | PRN
  Administered 2019-09-30 (×2): 100 ug

## 2019-09-30 MED ORDER — TRANEXAMIC ACID-NACL 1000-0.7 MG/100ML-% IV SOLN
INTRAVENOUS | Status: AC
  Filled 2019-09-30: qty 100

## 2019-09-30 MED ORDER — PHENYLEPHRINE 40 MCG/ML (10ML) SYRINGE FOR IV PUSH (FOR BLOOD PRESSURE SUPPORT)
PREFILLED_SYRINGE | INTRAVENOUS | Status: AC
  Filled 2019-09-30: qty 10

## 2019-09-30 MED ORDER — POVIDONE-IODINE 10 % EX SWAB: 2.0000 "application " | Freq: Once | CUTANEOUS | Status: DC

## 2019-09-30 MED ORDER — ONDANSETRON HCL 4 MG/2ML IJ SOLN
INTRAMUSCULAR | Status: DC | PRN
  Administered 2019-09-30: 4 mg via INTRAVENOUS

## 2019-09-30 MED ORDER — AMISULPRIDE (ANTIEMETIC) 5 MG/2ML IV SOLN
10.0000 mg | Freq: Once | INTRAVENOUS | Status: AC
  Administered 2019-09-30: 10 mg via INTRAVENOUS

## 2019-09-30 MED ORDER — DOCUSATE SODIUM 100 MG PO CAPS: 100.0000 mg | ORAL_CAPSULE | Freq: Two times a day (BID) | ORAL | 2 refills | Status: DC | PRN

## 2019-09-30 MED ORDER — HYDROMORPHONE HCL 2 MG/ML IJ SOLN
INTRAMUSCULAR | Status: AC
  Filled 2019-09-30: qty 1

## 2019-09-30 MED ORDER — LACTATED RINGERS IV BOLUS
250.0000 mL | Freq: Once | INTRAVENOUS | Status: AC
  Administered 2019-09-30: 250 mL via INTRAVENOUS

## 2019-09-30 MED ORDER — 0.9 % SODIUM CHLORIDE (POUR BTL) OPTIME
TOPICAL | Status: DC | PRN
  Administered 2019-09-30: 1000 mL

## 2019-09-30 MED ORDER — TIZANIDINE HCL 4 MG PO TABS: 4.0000 mg | ORAL_TABLET | Freq: Three times a day (TID) | ORAL | 0 refills | Status: DC | PRN

## 2019-09-30 MED ORDER — SODIUM CHLORIDE (PF) 0.9 % IJ SOLN
INTRAMUSCULAR | Status: AC
  Filled 2019-09-30: qty 50

## 2019-09-30 MED ORDER — LACTATED RINGERS IV SOLN: INTRAVENOUS | Status: DC

## 2019-09-30 MED ORDER — MIDAZOLAM HCL 5 MG/5ML IJ SOLN
INTRAMUSCULAR | Status: DC | PRN
  Administered 2019-09-30 (×2): 1 mg via INTRAVENOUS

## 2019-09-30 MED ORDER — CHLORHEXIDINE GLUCONATE 0.12 % MT SOLN
15.0000 mL | Freq: Once | OROMUCOSAL | Status: AC
  Administered 2019-09-30: 15 mL via OROMUCOSAL

## 2019-09-30 MED ORDER — ROCURONIUM BROMIDE 10 MG/ML (PF) SYRINGE
PREFILLED_SYRINGE | INTRAVENOUS | Status: AC
  Filled 2019-09-30: qty 20

## 2019-09-30 MED ORDER — ACETAMINOPHEN 10 MG/ML IV SOLN: 1000.0000 mg | Freq: Once | INTRAVENOUS | Status: DC | PRN

## 2019-09-30 MED ORDER — ORAL CARE MOUTH RINSE: 15.0000 mL | Freq: Once | OROMUCOSAL | Status: AC

## 2019-09-30 MED ORDER — BUPIVACAINE-EPINEPHRINE (PF) 0.25% -1:200000 IJ SOLN
INTRAMUSCULAR | Status: AC
  Filled 2019-09-30: qty 30

## 2019-09-30 MED ORDER — FENTANYL CITRATE (PF) 100 MCG/2ML IJ SOLN: 25.0000 ug | INTRAMUSCULAR | Status: DC | PRN

## 2019-09-30 MED ORDER — WATER FOR IRRIGATION, STERILE IR SOLN
Status: DC | PRN
  Administered 2019-09-30: 2000 mL

## 2019-09-30 MED ORDER — LACTATED RINGERS IV BOLUS
500.0000 mL | Freq: Once | INTRAVENOUS | Status: AC
  Administered 2019-09-30: 500 mL via INTRAVENOUS

## 2019-09-30 MED ORDER — BUPIVACAINE LIPOSOME 1.3 % IJ SUSP
INTRAMUSCULAR | Status: DC | PRN
  Administered 2019-09-30: 20 mL

## 2019-09-30 MED ORDER — ALBUTEROL SULFATE HFA 108 (90 BASE) MCG/ACT IN AERS
INHALATION_SPRAY | RESPIRATORY_TRACT | Status: DC | PRN
  Administered 2019-09-30: 4 via RESPIRATORY_TRACT

## 2019-09-30 MED ORDER — BUPIVACAINE-EPINEPHRINE (PF) 0.25% -1:200000 IJ SOLN
INTRAMUSCULAR | Status: DC | PRN
  Administered 2019-09-30: 30 mL

## 2019-09-30 SURGICAL SUPPLY — 54 items
ANCH SUT CRKSW FT 1.3X (Anchor) ×1 IMPLANT
ANCHOR SUT BIOCOMP CORKSREW (Anchor) ×1 IMPLANT
APL SKNCLS STERI-STRIP NONHPOA (GAUZE/BANDAGES/DRESSINGS) ×1
ATTUNE PS FEM RT SZ 4 CEM KNEE (Femur) ×1 IMPLANT
ATTUNE PSRP INSR SZ4 5 KNEE (Insert) ×1 IMPLANT
BAG SPEC THK2 15X12 ZIP CLS (MISCELLANEOUS) ×1
BAG ZIPLOCK 12X15 (MISCELLANEOUS) ×2 IMPLANT
BASEPLATE TIBIAL ROTATING SZ 4 (Knees) ×1 IMPLANT
BENZOIN TINCTURE PRP APPL 2/3 (GAUZE/BANDAGES/DRESSINGS) ×2 IMPLANT
BLADE SAGITTAL 25.0X1.19X90 (BLADE) ×2 IMPLANT
BLADE SAW SGTL 13.0X1.19X90.0M (BLADE) ×2 IMPLANT
BLADE SURG SZ10 CARB STEEL (BLADE) ×4 IMPLANT
BNDG ELASTIC 6X5.8 VLCR STR LF (GAUZE/BANDAGES/DRESSINGS) ×2 IMPLANT
BOOTIES KNEE HIGH SLOAN (MISCELLANEOUS) ×2 IMPLANT
BOWL SMART MIX CTS (DISPOSABLE) ×2 IMPLANT
BSPLAT TIB 4 CMNT ROT PLAT STR (Knees) ×1 IMPLANT
CEMENT HV SMART SET (Cement) ×4 IMPLANT
COVER SURGICAL LIGHT HANDLE (MISCELLANEOUS) ×2 IMPLANT
COVER WAND RF STERILE (DRAPES) IMPLANT
CUFF TOURN SGL QUICK 34 (TOURNIQUET CUFF) ×2
CUFF TRNQT CYL 34X4.125X (TOURNIQUET CUFF) ×1 IMPLANT
DECANTER SPIKE VIAL GLASS SM (MISCELLANEOUS) ×4 IMPLANT
DRAPE U-SHAPE 47X51 STRL (DRAPES) ×2 IMPLANT
DRSG AQUACEL AG ADV 3.5X10 (GAUZE/BANDAGES/DRESSINGS) ×2 IMPLANT
DURAPREP 26ML APPLICATOR (WOUND CARE) ×2 IMPLANT
ELECT REM PT RETURN 15FT ADLT (MISCELLANEOUS) ×2 IMPLANT
GLOVE BIOGEL PI IND STRL 8 (GLOVE) ×2 IMPLANT
GLOVE BIOGEL PI INDICATOR 8 (GLOVE) ×2
GLOVE ECLIPSE 7.5 STRL STRAW (GLOVE) ×4 IMPLANT
GOWN STRL REUS W/TWL XL LVL3 (GOWN DISPOSABLE) ×4 IMPLANT
HANDPIECE INTERPULSE COAX TIP (DISPOSABLE) ×2
HOLDER FOLEY CATH W/STRAP (MISCELLANEOUS) IMPLANT
HOOD PEEL AWAY FLYTE STAYCOOL (MISCELLANEOUS) ×6 IMPLANT
KIT TURNOVER KIT A (KITS) IMPLANT
MANIFOLD NEPTUNE II (INSTRUMENTS) ×2 IMPLANT
NEEDLE HYPO 22GX1.5 SAFETY (NEEDLE) ×2 IMPLANT
NS IRRIG 1000ML POUR BTL (IV SOLUTION) ×2 IMPLANT
PACK ICE MAXI GEL EZY WRAP (MISCELLANEOUS) ×2 IMPLANT
PACK TOTAL KNEE CUSTOM (KITS) ×2 IMPLANT
PADDING CAST COTTON 6X4 STRL (CAST SUPPLIES) ×2 IMPLANT
PATELLA MEDIAL ATTUN 35MM KNEE (Knees) ×1 IMPLANT
PENCIL SMOKE EVACUATOR (MISCELLANEOUS) IMPLANT
PIN DRILL FIX HALF THREAD (BIT) ×1 IMPLANT
PIN FIX SIGMA LCS THRD HI (PIN) ×1 IMPLANT
PROTECTOR NERVE ULNAR (MISCELLANEOUS) ×2 IMPLANT
SET HNDPC FAN SPRY TIP SCT (DISPOSABLE) ×1 IMPLANT
STRIP CLOSURE SKIN 1/2X4 (GAUZE/BANDAGES/DRESSINGS) ×2 IMPLANT
SUT MNCRL AB 3-0 PS2 18 (SUTURE) ×2 IMPLANT
SUT VIC AB 0 CT1 36 (SUTURE) ×2 IMPLANT
SUT VIC AB 1 CT1 36 (SUTURE) ×4 IMPLANT
SYR CONTROL 10ML LL (SYRINGE) ×4 IMPLANT
TRAY FOLEY MTR SLVR 16FR STAT (SET/KITS/TRAYS/PACK) ×2 IMPLANT
WATER STERILE IRR 1000ML POUR (IV SOLUTION) ×4 IMPLANT
WRAP KNEE MAXI GEL POST OP (GAUZE/BANDAGES/DRESSINGS) ×1 IMPLANT

## 2019-09-30 NOTE — Anesthesia Postprocedure Evaluation (Signed)
Anesthesia Post Note  Patient: Lindsay [name]  Procedure(s) Performed: TOTAL KNEE ARTHROPLASTY (Right Knee)     Patient location during evaluation: PACU Anesthesia Type: General Level of consciousness: awake and alert Pain management: pain level controlled Vital Signs Assessment: post-procedure vital signs reviewed and stable Respiratory status: spontaneous breathing, nonlabored ventilation, respiratory function stable and patient connected to nasal cannula oxygen Cardiovascular status: blood pressure returned to baseline and stable Postop Assessment: no apparent nausea or vomiting Anesthetic complications: no   No complications documented.  Last Vitals:  Vitals:   09/30/19 1030 09/30/19 1052  BP: 137/75 (!) 118/92  Pulse: 80 82  Resp: 12 12  Temp: 36.7 C 36.4 C  SpO2: 100% 91%    Last Pain:  Vitals:   09/30/19 1052  TempSrc: Oral  PainSc:                  Trevor Iha

## 2019-09-30 NOTE — Discharge Instructions (Signed)
You may bear weight on the right leg beginning today. Elevate and ice the knee several times daily. Keep ace bandage in place for 2 days.  You may remove it on day 3.  Do not remove the waterproof bandage. Do not apply lotions, powders, moisturizers to or near the waterproof bandage.

## 2019-09-30 NOTE — Anesthesia Procedure Notes (Signed)
Procedure Name: Intubation Date/Time: 09/30/2019 7:37 AM Performed by: Lissa Morales, CRNA Pre-anesthesia Checklist: Patient identified, Emergency Drugs available, Suction available and Patient being monitored Patient Re-evaluated:Patient Re-evaluated prior to induction Oxygen Delivery Method: Circle system utilized Preoxygenation: Pre-oxygenation with 100% oxygen Induction Type: IV induction Ventilation: Mask ventilation without difficulty Laryngoscope Size: Mac and 4 Grade View: Grade I Tube type: Oral Tube size: 7.5 mm Number of attempts: 1 Airway Equipment and Method: Stylet and Oral airway Placement Confirmation: ETT inserted through vocal cords under direct vision,  positive ETCO2 and breath sounds checked- equal and bilateral Secured at: 22 cm Tube secured with: Tape Dental Injury: Teeth and Oropharynx as per pre-operative assessment

## 2019-09-30 NOTE — Progress Notes (Signed)
Physical Therapy Treatment Patient Details Name: Lindsay [name] MRN: [id number] DOB: 1946-01-07 Today's Date: 09/30/2019    History of Present Illness 74 yo female admitted with L wrist pain/swelling, R knee pain affeting ability to ambulate. Hx of obesity, COPD, DM, obesity    PT Comments    Pt ambulated increased distance in hall, negotiated stairs, and reviewed HEP program with written instruction provided and dtr present for session.  Per chart [facility] to supply CPM and HHPT has been arranged.  Follow Up Recommendations  Home health PT;Follow surgeons recommendation for DC plan and follow-up therapies     Equipment Recommendations  Rolling walker with 5" wheels    Recommendations for Other Services       Precautions / Restrictions Precautions Precautions: Knee;Fall Restrictions Weight Bearing Restrictions: No Other Position/Activity Restrictions: WBAT    Mobility  Bed Mobility Overal bed mobility: Needs Assistance Bed Mobility: Supine to Sit     Supine to sit: Min guard     General bed mobility comments: Pt up in chair and returns to same  Transfers Overall transfer level: Needs assistance Equipment used: Rolling walker (2 wheeled) Transfers: Sit to/from Stand Sit to Stand: Min guard Stand pivot transfers: Min assist       General transfer comment: cues for LE management and use of UEs to self assist.    Ambulation/Gait Ambulation/Gait assistance: Min guard;Supervision Gait Distance (Feet): 100 Feet Assistive device: Rolling walker (2 wheeled) Gait Pattern/deviations: Step-to pattern;Shuffle;Trunk flexed;Antalgic;Decreased step length - right;Decreased step length - left Gait velocity: decr   General Gait Details: cues for sequence, posture, and position from RW;    Stairs Stairs: Yes Stairs assistance: Min assist Stair Management: Two rails;Step to pattern;Forwards Number of Stairs: 3 General stair comments: cues for sequence   Wheelchair  Mobility    Modified Rankin (Stroke Patients Only)       Balance Overall balance assessment: Needs assistance Sitting-balance support: Feet supported;No upper extremity supported Sitting balance-Leahy Scale: Good     Standing balance support: Bilateral upper extremity supported Standing balance-Leahy Scale: Fair                              Cognition Arousal/Alertness: Awake/alert Behavior During Therapy: WFL for tasks assessed/performed Overall Cognitive Status: Within Functional Limits for tasks assessed                                        Exercises Total Joint Exercises Ankle Circles/Pumps: AROM;Both;15 reps;Supine Quad Sets: AROM;Both;10 reps;Supine Heel Slides: AAROM;Right;10 reps;Supine Straight Leg Raises: AAROM;AROM;Right;10 reps;Supine Goniometric ROM: AAROM R knee -8 - 40    General Comments        Pertinent Vitals/Pain Pain Assessment: Faces Faces Pain Scale: Hurts little more Pain Location: R knee Pain Descriptors / Indicators: Aching;Numbness;Guarding;Grimacing Pain Intervention(s): Limited activity within patient's tolerance;Monitored during session;Premedicated before session    Home Living Family/patient expects to be discharged to:: Private residence Living Arrangements: Alone Available Help at Discharge: Family Type of Home: House Home Access: Stairs to enter Entrance Stairs-Rails: Right;Left Home Layout: One level Home Equipment: Environmental consultant - 4 wheels;Cane - single point Additional Comments: Above home refers to dtr's home where pt will initially dc to    Prior Function Level of Independence: Independent with assistive device(s)      Comments: uses "walking stick" for ambulation   PT  Goals (current goals can now be found in the care plan section) Acute Rehab PT Goals Patient Stated Goal: Regain IND PT Goal Formulation: With patient Time For Goal Achievement: 10/07/19 Potential to Achieve Goals: Good Progress  towards PT goals: Progressing toward goals    Frequency    7X/week      PT Plan Current plan remains appropriate    Co-evaluation              AM-PAC PT "6 Clicks" Mobility   Outcome Measure  Help needed turning from your back to your side while in a flat bed without using bedrails?: A Little Help needed moving from lying on your back to sitting on the side of a flat bed without using bedrails?: A Little Help needed moving to and from a bed to a chair (including a wheelchair)?: A Little Help needed standing up from a chair using your arms (e.g., wheelchair or bedside chair)?: A Little Help needed to walk in hospital room?: A Little Help needed climbing 3-5 steps with a railing? : A Little 6 Click Score: 18    End of Session Equipment Utilized During Treatment: Gait belt Activity Tolerance: Patient tolerated treatment well Patient left: in chair;with call bell/phone within reach;with nursing/sitter in room Nurse Communication: Mobility status PT Visit Diagnosis: Difficulty in walking, not elsewhere classified (R26.2)     Time: 5625-6389 PT Time Calculation (min) (ACUTE ONLY): 31 min  Charges:  $Gait Training: 8-22 mins $Therapeutic Exercise: 8-22 mins                     Mauro Kaufmann PT Acute Rehabilitation Services Pager [phone] Office [phone]    Jaston Havens 09/30/2019, 3:23 PM

## 2019-09-30 NOTE — H&P (Signed)
TOTAL KNEE ADMISSION H&P  Patient is being admitted for right total knee arthroplasty.  Subjective:  Chief Complaint:right knee pain.  HPI: Lindsay [name], 74 y.o. female, has a history of pain and functional disability in the right knee due to arthritis and has failed non-surgical conservative treatments for greater than 12 weeks to includeNSAID's and/or analgesics, corticosteriod injections, viscosupplementation injections and activity modification.  Onset of symptoms was gradual, starting 3 years ago with gradually worsening course since that time. The patient noted no past surgery on the right knee(s).  Patient currently rates pain in the right knee(s) at 9 out of 10 with activity. Patient has night pain, worsening of pain with activity and weight bearing, pain that interferes with activities of daily living, pain with passive range of motion and joint swelling.  Patient has evidence of subchondral sclerosis, periarticular osteophytes, joint subluxation and joint space narrowing by imaging studies. This patient has had Failure of all reasonable conservative care. There is no active infection.  Patient Active Problem List   Diagnosis Date Noted   Pre-operative respiratory examination 09/05/2019   Anemia, unspecified 07/28/2019   Right knee pain 07/28/2019   Hypothyroidism 01/12/2019   Ganglion of flexor tendon sheath of right middle finger 07/15/2018   Type 2 diabetes mellitus without complication, without long-term current use of insulin ([place]) 02/11/2018   Essential hypertension 02/11/2018   Hyperlipidemia 02/11/2018   Cough variant asthma 11/03/2017   Chest pain 05/17/2016   Obesity 05/17/2016   COPD (chronic obstructive pulmonary disease) (HCC)    Acid reflux    Urinary tract infection, chronic    Hypokalemia    COPD exacerbation ([place]) 01/24/2013   Past Medical History:  Diagnosis Date   Acid reflux    Anemia    Arthritis    Asthma    COPD (chronic  obstructive pulmonary disease) (HCC)    Diabetes mellitus without complication ([place])    TYPE 2    Hyperglycemia    Hypertension    Hypokalemia    Hypothyroidism    Obesity    PONV (postoperative nausea and vomiting)    Spinal stenosis    Urinary tract infection, chronic     Past Surgical History:  Procedure Laterality Date   ABDOMINAL HYSTERECTOMY     ANKLE FUSION Left    CHOLECYSTECTOMY      Current Facility-Administered Medications  Medication Dose Route Frequency Provider Last Rate Last Admin   bupivacaine liposome (EXPAREL) 1.3 % injection 266 mg  20 mL Other Once Dorna Leitz, MD       ceFAZolin (ANCEF) IVPB 2g/100 mL premix  2 g Intravenous On [name] to OR Dorna Leitz, MD       lactated ringers infusion   Intravenous Continuous Duane Boston, MD 10 mL/hr at 09/30/19 3704 Continued from Pre-op at 09/30/19 0635   povidone-iodine 10 % swab 2 application  2 application Topical Once Dorna Leitz, MD       tranexamic acid (CYKLOKAPRON) IVPB 1,000 mg  1,000 mg Intravenous To OR Dorna Leitz, MD       Facility-Administered Medications Ordered in Other Encounters  Medication Dose Route Frequency Provider Last Rate Last Admin   cloNIDine (DURACLON) 100 mcg/mL inj- OR Only   Infiltration Anesthesia Intra-op Barnet Glasgow, MD   100 mcg at 09/30/19 0711   fentaNYL (SUBLIMAZE) injection   Intravenous Anesthesia Intra-op Barnet Glasgow, MD   50 mcg at 09/30/19 0706   midazolam (VERSED) 5 MG/5ML injection   Intravenous Anesthesia  Intra-op Barnet Glasgow, MD   1 mg at 09/30/19 0017   ropivacaine (PF) 7.5 mg/mL (0.75%) (NAROPIN) injection   Peri-NEURAL Anesthesia Intra-op Barnet Glasgow, MD   20 mL at 09/30/19 0706   Allergies  Allergen Reactions   Codeine Nausea Only   Lisinopril Cough   Morphine And Related Itching   Shellfish Allergy Itching    Social History   Tobacco Use   Smoking status: Former Smoker    Packs/day: 0.50    Years: 20.00     Pack years: 10.00    Types: Cigarettes    Quit date: 01/06/1997    Years since quitting: 22.7   Smokeless tobacco: Never Used  Substance Use Topics   Alcohol use: No    Family History  Problem Relation Age of Onset   Microcephaly Maternal Grandmother    Heart disease Maternal Grandmother        Not clear details, patient just knows she took "heart pills"   Asthma Daughter      Review of Systems ROS: I have reviewed the patient's review of systems thoroughly and there are no positive responses as relates to the HPI. Objective:  Physical Exam  Vital signs in last 24 hours: Temp:  [98 F (36.7 C)] 98 F (36.7 C) (09/24 0533) Pulse Rate:  [79] 79 (09/24 0533) Resp:  [16] 16 (09/24 0533) BP: (128)/(75) 128/75 (09/24 0533) SpO2:  [0 %] 0 % (09/24 0533) Weight:  [86.6 kg] 86.6 kg (09/24 0546) Well-developed well-nourished patient in no acute distress. Alert and oriented x3 HEENT:within normal limits Cardiac: Regular rate and rhythm Pulmonary: Lungs clear to auscultation Abdomen: Soft and nontender.  Normal active bowel sounds  Musculoskeletal: Right knee: Painful range of motion.  Limited range of motion.  No instability.  Trace effusion.  Obvious valgus malalignment. Labs: Recent Results (from the past 2160 hour(s))  CBC with Differential     Status: Abnormal   Collection Time: 07/25/19  2:21 AM  Result Value Ref Range   WBC 11.2 (H) 4.0 - 10.5 K/uL   RBC 3.98 3.87 - 5.11 MIL/uL   Hemoglobin 10.1 (L) 12.0 - 15.0 g/dL   HCT 32.2 (L) 36 - 46 %   MCV 80.9 80.0 - 100.0 fL   MCH 25.4 (L) 26.0 - 34.0 pg   MCHC 31.4 30.0 - 36.0 g/dL   RDW 14.9 11.5 - 15.5 %   Platelets 374 150 - 400 K/uL   nRBC 0.0 0.0 - 0.2 %   Neutrophils Relative % 70 %   Neutro Abs 7.9 (H) 1.7 - 7.7 K/uL   Lymphocytes Relative 21 %   Lymphs Abs 2.3 0.7 - 4.0 K/uL   Monocytes Relative 8 %   Monocytes Absolute 0.9 0 - 1 K/uL   Eosinophils Relative 1 %   Eosinophils Absolute 0.1 0 - 0 K/uL    Basophils Relative 0 %   Basophils Absolute 0.0 0 - 0 K/uL   Immature Granulocytes 0 %   Abs Immature Granulocytes 0.03 0.00 - 0.07 K/uL    Comment: Performed at [facility], [place] [address], [place], [place] [address]  Comprehensive metabolic panel     Status: Abnormal   Collection Time: 07/25/19  2:21 AM  Result Value Ref Range   Sodium 138 135 - 145 mmol/L   Potassium 3.6 3.5 - 5.1 mmol/L   Chloride 106 98 - 111 mmol/L   CO2 21 (L) 22 - 32 mmol/L   Glucose, Bld  104 (H) 70 - 99 mg/dL    Comment: Glucose reference range applies only to samples taken after fasting for at least 8 hours.   BUN 15 8 - 23 mg/dL   Creatinine, Ser 0.68 0.44 - 1.00 mg/dL   Calcium 9.7 8.9 - 10.3 mg/dL   Total Protein 7.8 6.5 - 8.1 g/dL   Albumin 3.9 3.5 - 5.0 g/dL   AST 13 (L) 15 - 41 U/L   ALT 11 0 - 44 U/L   Alkaline Phosphatase 56 38 - 126 U/L   Total Bilirubin 0.7 0.3 - 1.2 mg/dL   GFR calc non Af Amer >60 >60 mL/min   GFR calc Af Amer >60 >60 mL/min   Anion gap 11 5 - 15    Comment: Performed at [facility], [place] [address] [place], [place] [address]  Sedimentation rate     Status: Abnormal   Collection Time: 07/25/19  2:21 AM  Result Value Ref Range   Sed Rate 90 (H) 0 - 22 mm/hr    Comment: Performed at [facility], [place] [address], [place], [place] [address]  Body fluid culture     Status: None   Collection Time: 07/25/19  2:22 AM   Specimen: KNEE; Body Fluid  Result Value Ref Range   Specimen Description      KNEE Performed at [facility], [place] [address] [place], [place] [address]    Special Requests      NONE Performed at [facility], [place] [address], [place], [place] [address]    Gram Stain      MODERATE WBC PRESENT, PREDOMINANTLY PMN NO ORGANISMS SEEN    Culture      NO GROWTH 3 DAYS Performed at [place] [address], [place], [place] [address]    Report Status  07/28/2019 FINAL   Glucose, Body Fluid Other     Status: None   Collection Time: 07/25/19  2:22 AM  Result Value Ref Range   Glucose, Body Fluid Other 79 mg/dL    Comment: (NOTE)                            _________________________________________                           : BODY FLUID TYPE :        GLUCOSE        :                           :_________________:_______________________:                           : Amniotic Fluid  :        45 - 76        :                           :_________________:_______________________:                           : Bile, Clear     :            < 5        :                           :  _________________:_______________________:                           : Bile, Yellow    :            < 8        :                           :_________________:_______________________:                           : Lymph           :        48 - 200       :                           :_________________:_______________________:                           : Nasal Secretion :            < 10       :                           :_________________:_______________________:                           : Pleural Fluid   :        65 -  99       :                            :_________________:_______________________:                           : Saliva          :            <  2       :                           : (Mixed Glands)  :                       :                           :_________________:_______________________:                           : Sweat           :            <  7       :                           :_________________:_______________________:                           : Synovial Fluid  :        65 -  99       :                           :  _________________:_______________________:                           : Tears           :        76 - 288       :                           :_________________:_______________________:                            Louanne Skye, Karene Fry Reference  Intervals                            for Adults and Children 2008. Ninth                            edition (V9.1) [place],                            [place]; [place]: July 2009. The reference intervals and  other method performance specifications have not been established for this test. The test result should be integrated into the clinical context for interpretation. Performed At: [facility] [place], [place] [id number] Rush Farmer MD QA:[id number]    Source of Sample KNEE     Comment: Performed at [place] [address], [place], [place] [address]  Protein, body fluid (other)     Status: None   Collection Time: 07/25/19  2:22 AM  Result Value Ref Range   Total Protein, Body Fluid Other 3.3 g/dL    Comment: (NOTE)                            _________________________________________                           : BODY FLUID TYPE :     TOTAL PROTEIN     :                           :_________________:_______________________:                           : Amniotic Fluid  :               <0.4    :                           :_________________:_______________________:                           :                 : Nonmalignant: <3.0    :                           : Ascitic Fluid   : Malignant:    >3.0    :                           :  _________________:_______________________:                           : Bile, Clear     :               <0.9    :                           :_________________:_______________________:                           : Bile, Yellow    :          0.2 - 0.6    :                           :_________________:_______________________:                           : Lymph           :          2.2 - 6.0    :                           :_________________:_______________________:                            : Human Milk      :          1.9 - 2.0    :                           :_________________:  ______________________:                           : Nasal Secretion :          0.1 - 3.5    :                           :_________________:_______________________:                           : Pancreatic      :          0.0 - 0.1    :                           : Juice           :    (post stimulation) :                           :_________________:_______________________:                           :                 : Transudate:   <0.3    :                           : Pleural Fluid   : Exudate:      >  0.3    :                           :_________________:_______________________:                           : Saliva          :          0.1 - 0.2    :                           : (Mixed Glands)  :                       :                           :_________________:_______________________:                           : Synovial Fluid  :               <2.5    :                            :_________________:_______________________:                           : Tears           :          0.8 - 0.9    :                           :_________________:_______________________:                            Louanne Skye, Karene Fry. Reference Intervals                            for  Adults and Children 2008. Ninth                            Edition (V9.1) [place],                            [place]; [place]: July 2009. The method performance specifications have not been established for this test in body fluid.  The test result should be integrated into the clinical context for interpretation. Performed At: [facility] [place], [place] [id number] Rush Farmer MD BU:[id number]    Source of Sample KNEE     Comment: Performed at [facility] [address], [place], [place] [address]  Synovial cell count + diff, w/ crystals     Status: Abnormal   Collection Time: 07/25/19  2:22 AM  Result Value Ref Range   Color, Synovial YELLOW YELLOW    Appearance-Synovial TURBID (A) CLEAR   Crystals, Fluid NO CRYSTALS SEEN    WBC, Synovial 5,280 (H) 0 - 200 /cu mm   Neutrophil, Synovial 82 (H) 0 - 25 %   Monocyte-Macrophage-Synovial Fluid 18 (L) 50 -  90 %    Comment: Performed at [facility], [place] [address] [place], [place] [address]  SARS Coronavirus 2 by RT PCR (hospital order, performed in [facility] hospital lab) Nasopharyngeal Nasopharyngeal Swab     Status: None   Collection Time: 07/25/19  9:00 PM   Specimen: Nasopharyngeal Swab  Result Value Ref Range   SARS Coronavirus 2 NEGATIVE NEGATIVE    Comment: (NOTE) SARS-CoV-2 target nucleic acids are NOT DETECTED.  The SARS-CoV-2 RNA is generally detectable in upper and lower respiratory specimens during the acute phase of infection. The lowest concentration of SARS-CoV-2 viral copies this assay can detect is 250 copies / mL. A negative result does not preclude SARS-CoV-2 infection and should not be used as the sole basis for treatment or other patient management decisions.  A negative result may occur with improper specimen collection / handling, submission of specimen other than nasopharyngeal swab, presence of viral mutation(s) within the areas targeted by this assay, and inadequate number of viral copies (<250 copies / mL). A negative result must be combined with clinical observations, patient history, and epidemiological information.  Fact Sheet for Patients:   StrictlyIdeas.no  Fact Sheet for Healthcare Providers: BankingDealers.co.za  This test is not yet approved or  cleared by the [place] FDA and has been authorized for detection and/or diagnosis of SARS-CoV-2 by FDA under an Emergency Use Authorization (EUA).  This EUA will remain in effect (meaning this test can be used) for the duration of the COVID-19 declaration under Section 564(b)(1) of the Act, 21 U.S.C. section 360bbb-3(b)(1), unless  the authorization is terminated or revoked sooner.  Performed at [facility], [place] [address], [place], [place] [address]   TSH     Status: Abnormal   Collection Time: 08/12/19 10:25 AM  Result Value Ref Range   TSH 10.23 (H) 0.35 - 4.50 uIU/mL  T4, free     Status: None   Collection Time: 08/12/19 10:25 AM  Result Value Ref Range   Free T4 1.11 0.60 - 1.60 ng/dL    Comment: Specimens from patients who are undergoing biotin therapy and /or ingesting biotin supplements may contain high levels of biotin.  The higher biotin concentration in these specimens interferes with this Free T4 assay.  Specimens that contain high levels  of biotin may cause false high results for this Free T4 assay.  Please interpret results in light of the total clinical presentation of the patient.    Ferritin     Status: None   Collection Time: 09/02/19  2:10 PM  Result Value Ref Range   Ferritin 92 16 - 288 ng/mL  Hemoglobin A1c     Status: Abnormal   Collection Time: 09/02/19  2:10 PM  Result Value Ref Range   Hgb A1c MFr Bld 5.9 (H) <5.7 % of total Hgb    Comment: For someone without known diabetes, a hemoglobin  A1c value between 5.7% and 6.4% is consistent with prediabetes and should be confirmed with a  follow-up test. . For someone with known diabetes, a value <7% indicates that their diabetes is well controlled. A1c targets should be individualized based on duration of diabetes, age, comorbid conditions, and other considerations. . This assay result is consistent with an increased risk of diabetes. . Currently, no consensus exists regarding use of hemoglobin A1c for diagnosis of diabetes for children. .    Mean Plasma Glucose 123 (calc)   eAG (mmol/L) 6.8 (calc)  Comp Met (CMET)  Status: None   Collection Time: 09/02/19  2:10 PM  Result Value Ref Range   Glucose, Bld 89 65 - 99 mg/dL    Comment: .            Fasting reference interval .    BUN 22 7 - 25 mg/dL    Creat 0.90 0.60 - 0.93 mg/dL    Comment: For patients >26 years of age, the reference limit for Creatinine is approximately 13% higher for people identified as African-American. .    BUN/Creatinine Ratio NOT APPLICABLE 6 - 22 (calc)   Sodium 139 135 - 146 mmol/L   Potassium 4.3 3.5 - 5.3 mmol/L   Chloride 105 98 - 110 mmol/L   CO2 24 20 - 32 mmol/L   Calcium 10.1 8.6 - 10.4 mg/dL   Total Protein 7.0 6.1 - 8.1 g/dL   Albumin 4.3 3.6 - 5.1 g/dL   Globulin 2.7 1.9 - 3.7 g/dL (calc)   AG Ratio 1.6 1.0 - 2.5 (calc)   Total Bilirubin 0.2 0.2 - 1.2 mg/dL   Alkaline phosphatase (APISO) 57 37 - 153 U/L   AST 12 10 - 35 U/L   ALT 7 6 - 29 U/L  CBC with Differential/Platelet     Status: Abnormal   Collection Time: 09/02/19  2:10 PM  Result Value Ref Range   WBC 9.0 3.8 - 10.8 Thousand/uL   RBC 3.98 3.80 - 5.10 Million/uL   Hemoglobin 10.5 (L) 11.7 - 15.5 g/dL   HCT 32.8 (L) 35 - 45 %   MCV 82.4 80.0 - 100.0 fL   MCH 26.4 (L) 27.0 - 33.0 pg   MCHC 32.0 32.0 - 36.0 g/dL   RDW 14.2 11.0 - 15.0 %   Platelets 396 140 - 400 Thousand/uL   MPV 10.1 7.5 - 12.5 fL   Neutro Abs 4,887 1,500 - 7,800 cells/uL   Lymphs Abs 2,997 850 - 3,900 cells/uL   Absolute Monocytes 630 200 - 950 cells/uL   Eosinophils Absolute 432 15 - 500 cells/uL   Basophils Absolute 54 0 - 200 cells/uL   Neutrophils Relative % 54.3 %   Total Lymphocyte 33.3 %   Monocytes Relative 7.0 %   Eosinophils Relative 4.8 %   Basophils Relative 0.6 %  APTT     Status: None   Collection Time: 09/21/19 10:10 AM  Result Value Ref Range   aPTT 36 24 - 36 seconds    Comment: Performed at [facility], [place] [address], [place], [place] [address]  CBC WITH DIFFERENTIAL     Status: Abnormal   Collection Time: 09/21/19 10:10 AM  Result Value Ref Range   WBC 9.6 4.0 - 10.5 K/uL   RBC 4.05 3.87 - 5.11 MIL/uL   Hemoglobin 10.7 (L) 12.0 - 15.0 g/dL   HCT 33.8 (L) 36 - 46 %   MCV 83.5 80.0 - 100.0 fL   MCH 26.4  26.0 - 34.0 pg   MCHC 31.7 30.0 - 36.0 g/dL   RDW 14.4 11.5 - 15.5 %   Platelets 381 150 - 400 K/uL   nRBC 0.0 0.0 - 0.2 %   Neutrophils Relative % 61 %   Neutro Abs 5.9 1.7 - 7.7 K/uL   Lymphocytes Relative 27 %   Lymphs Abs 2.6 0.7 - 4.0 K/uL   Monocytes Relative 7 %   Monocytes Absolute 0.6 0 - 1 K/uL   Eosinophils Relative 4 %   Eosinophils Absolute 0.4 0 - 0  K/uL   Basophils Relative 1 %   Basophils Absolute 0.1 0 - 0 K/uL   Immature Granulocytes 0 %   Abs Immature Granulocytes 0.04 0.00 - 0.07 K/uL    Comment: Performed at [facility], [place] [address], [place], [place] [address]  Comprehensive metabolic panel     Status: Abnormal   Collection Time: 09/21/19 10:10 AM  Result Value Ref Range   Sodium 139 135 - 145 mmol/L   Potassium 4.2 3.5 - 5.1 mmol/L   Chloride 102 98 - 111 mmol/L   CO2 25 22 - 32 mmol/L   Glucose, Bld 80 70 - 99 mg/dL    Comment: Glucose reference range applies only to samples taken after fasting for at least 8 hours.   BUN 22 8 - 23 mg/dL   Creatinine, Ser 0.84 0.44 - 1.00 mg/dL   Calcium 9.8 8.9 - 10.3 mg/dL   Total Protein 7.6 6.5 - 8.1 g/dL   Albumin 4.0 3.5 - 5.0 g/dL   AST 14 (L) 15 - 41 U/L   ALT 10 0 - 44 U/L   Alkaline Phosphatase 52 38 - 126 U/L   Total Bilirubin 0.5 0.3 - 1.2 mg/dL   GFR calc non Af Amer >60 >60 mL/min   GFR calc Af Amer >60 >60 mL/min   Anion gap 12 5 - 15    Comment: Performed at [facility], [place] [address], [place], [place] [address]  Protime-INR     Status: None   Collection Time: 09/21/19 10:10 AM  Result Value Ref Range   Prothrombin Time 12.9 11.4 - 15.2 seconds   INR 1.0 0.8 - 1.2    Comment: (NOTE) INR goal varies based on device and disease states. Performed at [facility], [place] [address] [place], [place] [address]   Type and screen Order type and screen if day of surgery is less than 15 days from draw of preadmission visit or order morning of  surgery if day of surgery is greater than 6 days from preadmission visit.     Status: None   Collection Time: 09/21/19 10:10 AM  Result Value Ref Range   ABO/RH(D) A POS    Antibody Screen NEG    Sample Expiration 10/03/2019,2359    Extend sample reason      NO TRANSFUSIONS OR PREGNANCY IN THE PAST 3 MONTHS Performed at [facility], [place] [address], [place], [place] [address]   Urinalysis, Routine w reflex microscopic Urine, Clean Catch     Status: Abnormal   Collection Time: 09/21/19 10:10 AM  Result Value Ref Range   Color, Urine YELLOW YELLOW   APPearance CLEAR CLEAR   Specific Gravity, Urine 1.012 1.005 - 1.030   pH 6.0 5.0 - 8.0   Glucose, UA NEGATIVE NEGATIVE mg/dL   Hgb urine dipstick NEGATIVE NEGATIVE   Bilirubin Urine NEGATIVE NEGATIVE   Ketones, ur NEGATIVE NEGATIVE mg/dL   Protein, ur NEGATIVE NEGATIVE mg/dL   Nitrite NEGATIVE NEGATIVE   Leukocytes,Ua TRACE (A) NEGATIVE   RBC / HPF 0-5 0 - 5 RBC/hpf   WBC, UA 0-5 0 - 5 WBC/hpf   Bacteria, UA RARE (A) NONE SEEN   Squamous Epithelial / LPF 0-5 0 - 5   Mucus PRESENT     Comment: Performed at [facility], [place] [address], [place], [place] [address]  Surgical pcr screen     Status: None   Collection Time: 09/21/19 10:10 AM  Specimen: Nasal Mucosa; Nasal Swab  Result Value Ref Range   MRSA, PCR NEGATIVE NEGATIVE   Staphylococcus aureus NEGATIVE NEGATIVE    Comment: (NOTE) The Xpert SA Assay (FDA approved for NASAL specimens in patients 62 years of age and older), is one component of a comprehensive surveillance program. It is not intended to diagnose infection nor to guide or monitor treatment. Performed at [facility], [place] [address] [place], [place] [address]   SARS CORONAVIRUS 2 (TAT 6-24 HRS) Nasopharyngeal Nasopharyngeal Swab     Status: None   Collection Time: 09/27/19  9:00 AM   Specimen: Nasopharyngeal Swab  Result Value Ref Range   SARS  Coronavirus 2 NEGATIVE NEGATIVE    Comment: (NOTE) SARS-CoV-2 target nucleic acids are NOT DETECTED.  The SARS-CoV-2 RNA is generally detectable in upper and lower respiratory specimens during the acute phase of infection. Negative results do not preclude SARS-CoV-2 infection, do not rule out co-infections with other pathogens, and should not be used as the sole basis for treatment or other patient management decisions. Negative results must be combined with clinical observations, patient history, and epidemiological information. The expected result is Negative.  Fact Sheet for Patients: SugarRoll.be  Fact Sheet for Healthcare Providers: https://www.woods-mathews.com/  This test is not yet approved or cleared by the [place] FDA and  has been authorized for detection and/or diagnosis of SARS-CoV-2 by FDA under an Emergency Use Authorization (EUA). This EUA will remain  in effect (meaning this test can be used) for the duration of the COVID-19 declaration under Se ction 564(b)(1) of the Act, 21 U.S.C. section 360bbb-3(b)(1), unless the authorization is terminated or revoked sooner.  Performed at [place] [facility], [place] [address] [place], [place] [address]   Glucose, capillary     Status: None   Collection Time: 09/30/19  5:41 AM  Result Value Ref Range   Glucose-Capillary 92 70 - 99 mg/dL    Comment: Glucose reference range applies only to samples taken after fasting for at least 8 hours.   Comment 1 Notify RN    Comment 2 Document in Chart   ABO/Rh     Status: None   Collection Time: 09/30/19  5:54 AM  Result Value Ref Range   ABO/RH(D)      A POS Performed at [facility], [place] [address] [place], [place] [address]     Estimated body mass index is 33.83 kg/m as calculated from the following:   Height as of 09/05/19: 5' 3"  (1.6 m).   Weight as of this encounter: 86.6 kg.   Imaging Review Plain  radiographs demonstrate severe degenerative joint disease of the right knee(s). The overall alignment issignificant valgus. The bone quality appears to be fair for age and reported activity level.      Assessment/Plan:  End stage arthritis, right knee   The patient history, physical examination, clinical judgment of the provider and imaging studies are consistent with end stage degenerative joint disease of the right knee(s) and total knee arthroplasty is deemed medically necessary. The treatment options including medical management, injection therapy arthroscopy and arthroplasty were discussed at length. The risks and benefits of total knee arthroplasty were presented and reviewed. The risks due to aseptic loosening, infection, stiffness, patella tracking problems, thromboembolic complications and other imponderables were discussed. The patient acknowledged the explanation, agreed to proceed with the plan and consent was signed. Patient is being admitted for inpatient treatment for surgery, pain control, PT, OT, prophylactic antibiotics,  VTE prophylaxis, progressive ambulation and ADL's and discharge planning. The patient is planning to be discharged home with home health services     Patient's anticipated LOS is less than 2 midnights, meeting these requirements: - Younger than 33 - Lives within 1 hour of care - Has a competent adult at home to recover with post-op recover - NO history of  - Chronic pain requiring opiods  - Diabetes  - Coronary Artery Disease  - Heart failure  - Heart attack  - Stroke  - DVT/VTE  - Cardiac arrhythmia  - Respiratory Failure/COPD  - Renal failure  - Anemia  - Advanced Liver disease

## 2019-09-30 NOTE — Evaluation (Signed)
Physical Therapy Evaluation Patient Details Name: Lindsay [name] MRN: [id number] DOB: 06/06/1945 Today's Date: 09/30/2019   History of Present Illness  74 yo female admitted with L wrist pain/swelling, R knee pain affeting ability to ambulate. Hx of obesity, COPD, DM, obesity  Clinical Impression  Pt s/p R TKR and presents with decreased R LE strength/ROM, post op pain and nausea limiting functional mobility.  Pt should progress to dc home with family assist and reports HH PT to follow up.  Pt would benefit from use of RW for home.    Follow Up Recommendations Home health PT;Follow surgeon's recommendation for DC plan and follow-up therapies    Equipment Recommendations  Rolling walker with 5" wheels    Recommendations for Other Services       Precautions / Restrictions Precautions Precautions: Knee;Fall Restrictions Weight Bearing Restrictions: No Other Position/Activity Restrictions: WBAT      Mobility  Bed Mobility Overal bed mobility: Needs Assistance Bed Mobility: Supine to Sit     Supine to sit: Min guard     General bed mobility comments: Increased time with min guard assist for safety  Transfers Overall transfer level: Needs assistance Equipment used: Rolling walker (2 wheeled) Transfers: Sit to/from [employer] Sit to Stand: Min assist Stand pivot transfers: Min assist       General transfer comment: cues for LE management and use of UEs to self assist.  Stand pvt bed to [facility]  Ambulation/Gait Ambulation/Gait assistance: Min assist Gait Distance (Feet): 32 Feet Assistive device: Rolling walker (2 wheeled) Gait Pattern/deviations: Step-to pattern;Shuffle;Trunk flexed;Antalgic;Decreased step length - right;Decreased step length - left Gait velocity: decr   General Gait Details: cues for sequence, posture, and position from RW; distance ltd by nausea  Stairs            Wheelchair Mobility    Modified Rankin (Stroke Patients  Only)       Balance Overall balance assessment: Needs assistance Sitting-balance support: Feet supported;No upper extremity supported Sitting balance-Leahy Scale: Good     Standing balance support: Bilateral upper extremity supported Standing balance-Leahy Scale: Fair                               Pertinent Vitals/Pain Pain Assessment: Faces Faces Pain Scale: Hurts little more Pain Location: R knee Pain Descriptors / Indicators: Aching;Numbness;Guarding;Grimacing Pain Intervention(s): Limited activity within patient's tolerance;Monitored during session;Premedicated before session    Home Living Family/patient expects to be discharged to:: Private residence Living Arrangements: Alone Available Help at Discharge: Family Type of Home: House Home Access: Stairs to enter Entrance Stairs-Rails: Doctor, general practice of Steps: 2 Home Layout: One level Home Equipment: Environmental consultant - 4 wheels;Cane - single point Additional Comments: Above home refers to dtr's home where pt will initially dc to    Prior Function Level of Independence: Independent with assistive device(s)         Comments: uses "walking stick" for ambulation     Hand Dominance        Extremity/Trunk Assessment   Upper Extremity Assessment Upper Extremity Assessment: Overall WFL for tasks assessed    Lower Extremity Assessment Lower Extremity Assessment: RLE deficits/detail RLE Deficits / Details: Pt able to perform IND SLR after assist with first 3 reps    Cervical / Trunk Assessment Cervical / Trunk Assessment: Normal  Communication   Communication: No difficulties  Cognition Arousal/Alertness: Awake/alert Behavior During Therapy: WFL for tasks assessed/performed Overall Cognitive  Status: Within Functional Limits for tasks assessed                                        General Comments      Exercises Total Joint Exercises Ankle Circles/Pumps:  AROM;Both;15 reps;Supine Straight Leg Raises: AAROM;AROM;Right;10 reps;Supine   Assessment/Plan    PT Assessment Patient needs continued PT services  PT Problem List Decreased strength;Decreased range of motion;Decreased activity tolerance;Decreased balance;Decreased mobility;Decreased knowledge of use of DME;Pain;Obesity       PT Treatment Interventions DME instruction;Gait training;Stair training;Functional mobility training;Therapeutic activities;Therapeutic exercise;Balance training;Patient/family education    PT Goals (Current goals can be found in the Care Plan section)  Acute Rehab PT Goals Patient Stated Goal: Regain IND PT Goal Formulation: With patient Time For Goal Achievement: 10/07/19 Potential to Achieve Goals: Good    Frequency 7X/week   Barriers to discharge        Co-evaluation               AM-PAC PT "6 Clicks" Mobility  Outcome Measure Help needed turning from your back to your side while in a flat bed without using bedrails?: A Little Help needed moving from lying on your back to sitting on the side of a flat bed without using bedrails?: A Little Help needed moving to and from a bed to a chair (including a wheelchair)?: A Little Help needed standing up from a chair using your arms (e.g., wheelchair or bedside chair)?: A Little Help needed to walk in hospital room?: A Little Help needed climbing 3-5 steps with a railing? : A Lot 6 Click Score: 17    End of Session Equipment Utilized During Treatment: Gait belt Activity Tolerance: Other (comment) (nausea) Patient left: in chair;with call bell/phone within reach;with nursing/sitter in room Nurse Communication: Mobility status PT Visit Diagnosis: Difficulty in walking, not elsewhere classified (R26.2)    Time: 4970-2637 PT Time Calculation (min) (ACUTE ONLY): 27 min   Charges:   PT Evaluation $PT Eval Low Complexity: 1 Low PT Treatments $Gait Training: 8-22 mins        Mauro Kaufmann  PT Acute Rehabilitation Services Pager [phone] Office [phone]   Rafik Koppel 09/30/2019, 2:31 PM

## 2019-09-30 NOTE — Anesthesia Procedure Notes (Signed)
Spinal  Start time: 09/30/2019 7:20 AM End time: 09/30/2019 7:25 AM Staffing Performed: anesthesiologist  Anesthesiologist: Trevor Iha, MD Spinal Block Patient position: sitting Prep: ChloraPrep Location: L3-4 Needle Needle type: Pencan  Needle gauge: 24 G Additional Notes 1 attempt at L2-3 2 attempts at L3-4 . Pt w severe stenosis and scoliosis further attempts abandoned

## 2019-09-30 NOTE — Anesthesia Procedure Notes (Signed)
Anesthesia Regional Block: Adductor canal block   Pre-Anesthetic Checklist: ,, timeout performed, Correct Patient, Correct Site, Correct Laterality, Correct Procedure, Correct Position, site marked, Risks and benefits discussed,  Surgical consent,  Pre-op evaluation,  At surgeon's request and post-op pain management  Laterality: Lower and Right  Prep: chloraprep       Needles:  Injection technique: Single-shot  Needle Type: Echogenic Needle     Needle Length: 9cm  Needle Gauge: 22     Additional Needles:   Procedures:,,,, ultrasound used (permanent image in chart),,,,  Narrative:  Start time: 09/30/2019 7:00 AM End time: 09/30/2019 7:06 AM Injection made incrementally with aspirations every 5 mL.  Performed by: Personally  Anesthesiologist: Trevor Iha, MD  Additional Notes: Block assessed prior to surgery. Pt tolerated procedure well.

## 2019-09-30 NOTE — Transfer of Care (Signed)
Immediate Anesthesia Transfer of Care Note  Patient: Lindsay [name]  Procedure(s) Performed: TOTAL KNEE ARTHROPLASTY (Right Knee)  Patient Location: PACU  Anesthesia Type:General  Level of Consciousness: awake, alert , oriented and patient cooperative  Airway & Oxygen Therapy: Patient Spontanous Breathing and Patient connected to face mask oxygen  Post-op Assessment: Report given to RN, Post -op Vital signs reviewed and stable and Patient moving all extremities X 4  Post vital signs: stable  Last Vitals:  Vitals Value Taken Time  BP 115/66 09/30/19 0915  Temp    Pulse 72 09/30/19 0919  Resp 11 09/30/19 0919  SpO2 100 % 09/30/19 0919  Vitals shown include unvalidated device data.  Last Pain:  Vitals:   09/30/19 0913  TempSrc:   PainSc: (P) 0-No pain         Complications: No complications documented.

## 2019-09-30 NOTE — Op Note (Addendum)
PATIENT ID:      Lindsay [name]  MRN:     [id number] DOB/AGE:    1945/09/19 / 74 y.o.       OPERATIVE REPORT   DATE OF PROCEDURE:  09/30/2019      PREOPERATIVE DIAGNOSIS:   RIGHT KNEE DEGENERATIVE JOINT DISEASE      Estimated body mass index is 33.83 kg/m as calculated from the following:   Height as of 09/05/19: 5\' 3"  (1.6 m).   Weight as of this encounter: 86.6 kg.                                                       POSTOPERATIVE DIAGNOSIS:  1.  Right knee degenerative joint disease 2.  Complete a avulsion of the patellar tendon insertion                                                                  PROCEDURE:  Procedure(s): 1.TOTAL KNEE ARTHROPLASTY Using DepuyAttune RP implants #4 Femur, #4Tibia, 5 mm Attune RP bearing, 35 Patella 2.  Patellar tendon repair with a 5 5 corkscrew anchor and 2 labral tapes    SURGEON:  ASSISTANT:  Harvie Junior PA-C   (Present and scrubbed throughout the case, critical for assistance with exposure, retraction, instrumentation, and closure.)        ANESTHESIA: general, 20cc Exparel, 50cc 0.25% Marcaine EBL: min cc FLUID REPLACEMENT: unk cc crystaloid TOURNIQUET: DRAINS: None TRANEXAMIC ACID: 1gm IV, 2gm topical COMPLICATIONS:  None         INDICATIONS FOR PROCEDURE: The patient has  RIGHT KNEE DEGENERATIVE JOINT DISEASE, valgus deformities, XR shows bone on bone arthritis, lateral subluxation of tibia. Patient has failed all conservative measures including anti-inflammatory medicines, narcotics, attempts at exercise and weight loss, cortisone injections and viscosupplementation.  Risks and benefits of surgery have been discussed, questions answered.   DESCRIPTION OF PROCEDURE: The patient identified by armband, received  IV antibiotics, in the holding area at [facility]. Patient taken to the operating room, appropriate anesthetic monitors were attached, and general anesthesia was  induced. IV Tranexamic acid was  given.Tourniquet applied high to the operative thigh. Lateral post and foot positioner applied to the table, the lower extremity was then prepped and draped in usual sterile fashion from the toes to the tourniquet. Time-out procedure was performed. [facility], was present and scrubbed throughout the case, critical for assistance with, positioning, exposure, retraction, instrumentation, and closure.The skin and subcutaneous tissue along the incision was injected with 20 cc of a mixture of Exparel and Marcaine solution, using a 20-gauge by 1-1/2 inch needle. We began the operation, with the knee flexed 130 degrees, by making the anterior midline incision starting at handbreadth above the patella going over the patella 1 cm medial to and 4 cm distal to the tibial tubercle. Small bleeders in the skin and the subcutaneous tissue identified and cauterized. Transverse retinaculum was incised and reflected medially and a medial parapatellar arthrotomy was accomplished. the patella was everted and theprepatellar fat pad resected. The superficial medial collateral ligament was then  elevated from anterior to posterior along the proximal flare of the tibia and anterior half of the menisci resected. The knee was hyperflexed exposing bone on bone arthritis. Peripheral and notch osteophytes as well as the cruciate ligaments were then resected. We continued to work our way around posteriorly along the proximal tibia, and externally rotated the tibia subluxing it out from underneath the femur. A McHale PCL retractor was placed through the notch and a lateral Hohmann retractor placed, and we then entered the proximal tibia in line with the Depuy starter drill in line with the axis of the tibia followed by an intramedullary guide rod and 0-degree posterior slope cutting guide. The tibial cutting guide, 4 degree posterior sloped, was pinned into place allowing resection of 10 mm of bone medially and 2 mm of bone laterally.  Satisfied with the tibial resection, we then entered the distal femur 2 mm anterior to the PCL origin with the intramedullary guide rod and applied the distal femoral cutting guide set at 9 mm, with 5 degrees of valgus. This was pinned along the epicondylar axis. At this point, the distal femoral cut was accomplished without difficulty. We then sized for a #4 femoral component and pinned the guide in o degrees of external rotation. The chamfer cutting guide was pinned into place. The anterior, posterior, and chamfer cuts were accomplished without difficulty followed by the Attune RP box cutting guide and the box cut. We also removed posterior osteophytes from the posterior femoral condyles. The posterior capsule was injected with Exparel solution. The knee was brought into full extension. We checked our extension gap and fit a 5 mm bearing. Distracting in extension with a lamina spreader,  bleeders in the posterior capsule, Posterior medial and posterior lateral gutter were cauterized.  The transexamic acid-soaked sponge was then placed in the gap of the knee in extension. The knee was flexed 30. The posterior patella cut was accomplished with the 9.5 mm Attune cutting guide, sized for a 34mm dome, and the fixation pegs drilled.The knee was then once again hyperflexed exposing the proximal tibia. We sized for a # 4 tibial base plate, applied the smokestack and the conical reamer followed by the the Delta fin keel punch. We then hammered into place the Attune RP trial femoral component, drilled the lugs, inserted a  5 mm trial bearing, trial patellar button, and took the knee through range of motion from 0-130 degrees. Medial and lateral ligamentous stability was checked. No thumb pressure was required for patellar Tracking. The tourniquet was released.   At this point it was observed that the insertion of the patellar tendon had completely avulsed.  There was a large heaped up area of bone in this area and I  wonder if that would lead to the avulsion.  I took a rongeur and really roughened up this area.At this point I took a 5.5 mm bio composite corkscrew anchor and made a drill hole into the patellar tendon insertion.  There were 2 labral tapes attached to this anchor.  I did horizontal mattress stitches and tied this down.  Excellent repair of the patellar tendon was achieved.  All trial components were removed, mating surfaces irrigated with pulse lavage, and dried with suction and sponges. 10 cc of the Exparel solution was applied to the cancellus bone of the patella distal femur and proximal tibia.  After waiting 30 seconds, the bony surfaces were again, dried with sponges. A double batch of DePuy HV cement was mixed and applied  to all bony metallic mating surfaces except for the posterior condyles of the femur itself. In order, we hammered into place the tibial tray and removed excess cement, the femoral component and removed excess cement. The final Attune RP bearing was inserted, and the knee brought to full extension with compression. The patellar button was clamped into place, and excess cement removed. The knee was held at 30 flexion with compression, while the cement cured. The wound was irrigated out with normal saline solution pulse lavage. The rest of the Exparel was injected into the parapatellar arthrotomy, subcutaneous tissues, and periosteal tissues. The parapatellar arthrotomy was closed with running #1 Vicryl suture. The subcutaneous tissue with 3-0 undyed Vicryl suture, and the skin with running 3-0 SQ vicryl. An Aquacil and Ace wrap were applied. The patient was taken to recovery room without difficulty.   Harvie Junior 09/30/2019, 9:16 AM

## 2019-10-02 DIAGNOSIS — Z87440 Personal history of urinary (tract) infections: Secondary | ICD-10-CM | POA: Diagnosis not present

## 2019-10-02 DIAGNOSIS — E785 Hyperlipidemia, unspecified: Secondary | ICD-10-CM | POA: Diagnosis not present

## 2019-10-02 DIAGNOSIS — Z9049 Acquired absence of other specified parts of digestive tract: Secondary | ICD-10-CM | POA: Diagnosis not present

## 2019-10-02 DIAGNOSIS — E039 Hypothyroidism, unspecified: Secondary | ICD-10-CM | POA: Diagnosis not present

## 2019-10-02 DIAGNOSIS — J45991 Cough variant asthma: Secondary | ICD-10-CM | POA: Diagnosis not present

## 2019-10-02 DIAGNOSIS — Z7951 Long term (current) use of inhaled steroids: Secondary | ICD-10-CM | POA: Diagnosis not present

## 2019-10-02 DIAGNOSIS — J449 Chronic obstructive pulmonary disease, unspecified: Secondary | ICD-10-CM | POA: Diagnosis not present

## 2019-10-02 DIAGNOSIS — Z7984 Long term (current) use of oral hypoglycemic drugs: Secondary | ICD-10-CM | POA: Diagnosis not present

## 2019-10-02 DIAGNOSIS — Z96651 Presence of right artificial knee joint: Secondary | ICD-10-CM | POA: Diagnosis not present

## 2019-10-02 DIAGNOSIS — Z471 Aftercare following joint replacement surgery: Secondary | ICD-10-CM | POA: Diagnosis not present

## 2019-10-02 DIAGNOSIS — Z7982 Long term (current) use of aspirin: Secondary | ICD-10-CM | POA: Diagnosis not present

## 2019-10-02 DIAGNOSIS — N3281 Overactive bladder: Secondary | ICD-10-CM | POA: Diagnosis not present

## 2019-10-02 DIAGNOSIS — K219 Gastro-esophageal reflux disease without esophagitis: Secondary | ICD-10-CM | POA: Diagnosis not present

## 2019-10-02 DIAGNOSIS — Z9181 History of falling: Secondary | ICD-10-CM | POA: Diagnosis not present

## 2019-10-02 DIAGNOSIS — D649 Anemia, unspecified: Secondary | ICD-10-CM | POA: Diagnosis not present

## 2019-10-02 DIAGNOSIS — Z87891 Personal history of nicotine dependence: Secondary | ICD-10-CM | POA: Diagnosis not present

## 2019-10-02 DIAGNOSIS — I10 Essential (primary) hypertension: Secondary | ICD-10-CM | POA: Diagnosis not present

## 2019-10-02 DIAGNOSIS — M48061 Spinal stenosis, lumbar region without neurogenic claudication: Secondary | ICD-10-CM | POA: Diagnosis not present

## 2019-10-02 DIAGNOSIS — E119 Type 2 diabetes mellitus without complications: Secondary | ICD-10-CM | POA: Diagnosis not present

## 2019-10-03 ENCOUNTER — Encounter (HOSPITAL_COMMUNITY): Payer: Self-pay | Admitting: Orthopedic Surgery

## 2019-10-05 DIAGNOSIS — Z9181 History of falling: Secondary | ICD-10-CM | POA: Diagnosis not present

## 2019-10-05 DIAGNOSIS — Z7951 Long term (current) use of inhaled steroids: Secondary | ICD-10-CM | POA: Diagnosis not present

## 2019-10-05 DIAGNOSIS — D649 Anemia, unspecified: Secondary | ICD-10-CM | POA: Diagnosis not present

## 2019-10-05 DIAGNOSIS — I10 Essential (primary) hypertension: Secondary | ICD-10-CM | POA: Diagnosis not present

## 2019-10-05 DIAGNOSIS — J45991 Cough variant asthma: Secondary | ICD-10-CM | POA: Diagnosis not present

## 2019-10-05 DIAGNOSIS — E119 Type 2 diabetes mellitus without complications: Secondary | ICD-10-CM | POA: Diagnosis not present

## 2019-10-05 DIAGNOSIS — Z7982 Long term (current) use of aspirin: Secondary | ICD-10-CM | POA: Diagnosis not present

## 2019-10-05 DIAGNOSIS — Z7984 Long term (current) use of oral hypoglycemic drugs: Secondary | ICD-10-CM | POA: Diagnosis not present

## 2019-10-05 DIAGNOSIS — E785 Hyperlipidemia, unspecified: Secondary | ICD-10-CM | POA: Diagnosis not present

## 2019-10-05 DIAGNOSIS — M48061 Spinal stenosis, lumbar region without neurogenic claudication: Secondary | ICD-10-CM | POA: Diagnosis not present

## 2019-10-05 DIAGNOSIS — Z96651 Presence of right artificial knee joint: Secondary | ICD-10-CM | POA: Diagnosis not present

## 2019-10-05 DIAGNOSIS — Z9049 Acquired absence of other specified parts of digestive tract: Secondary | ICD-10-CM | POA: Diagnosis not present

## 2019-10-05 DIAGNOSIS — Z471 Aftercare following joint replacement surgery: Secondary | ICD-10-CM | POA: Diagnosis not present

## 2019-10-05 DIAGNOSIS — N3281 Overactive bladder: Secondary | ICD-10-CM | POA: Diagnosis not present

## 2019-10-05 DIAGNOSIS — J449 Chronic obstructive pulmonary disease, unspecified: Secondary | ICD-10-CM | POA: Diagnosis not present

## 2019-10-05 DIAGNOSIS — Z87440 Personal history of urinary (tract) infections: Secondary | ICD-10-CM | POA: Diagnosis not present

## 2019-10-05 DIAGNOSIS — Z87891 Personal history of nicotine dependence: Secondary | ICD-10-CM | POA: Diagnosis not present

## 2019-10-05 DIAGNOSIS — E039 Hypothyroidism, unspecified: Secondary | ICD-10-CM | POA: Diagnosis not present

## 2019-10-05 DIAGNOSIS — K219 Gastro-esophageal reflux disease without esophagitis: Secondary | ICD-10-CM | POA: Diagnosis not present

## 2019-10-07 ENCOUNTER — Other Ambulatory Visit: Payer: Medicare Other

## 2019-10-07 DIAGNOSIS — J449 Chronic obstructive pulmonary disease, unspecified: Secondary | ICD-10-CM | POA: Diagnosis not present

## 2019-10-07 DIAGNOSIS — Z87891 Personal history of nicotine dependence: Secondary | ICD-10-CM | POA: Diagnosis not present

## 2019-10-07 DIAGNOSIS — Z7951 Long term (current) use of inhaled steroids: Secondary | ICD-10-CM | POA: Diagnosis not present

## 2019-10-07 DIAGNOSIS — Z9049 Acquired absence of other specified parts of digestive tract: Secondary | ICD-10-CM | POA: Diagnosis not present

## 2019-10-07 DIAGNOSIS — J45991 Cough variant asthma: Secondary | ICD-10-CM | POA: Diagnosis not present

## 2019-10-07 DIAGNOSIS — Z471 Aftercare following joint replacement surgery: Secondary | ICD-10-CM | POA: Diagnosis not present

## 2019-10-07 DIAGNOSIS — Z9181 History of falling: Secondary | ICD-10-CM | POA: Diagnosis not present

## 2019-10-07 DIAGNOSIS — D649 Anemia, unspecified: Secondary | ICD-10-CM | POA: Diagnosis not present

## 2019-10-07 DIAGNOSIS — I10 Essential (primary) hypertension: Secondary | ICD-10-CM | POA: Diagnosis not present

## 2019-10-07 DIAGNOSIS — Z7982 Long term (current) use of aspirin: Secondary | ICD-10-CM | POA: Diagnosis not present

## 2019-10-07 DIAGNOSIS — E785 Hyperlipidemia, unspecified: Secondary | ICD-10-CM | POA: Diagnosis not present

## 2019-10-07 DIAGNOSIS — Z96651 Presence of right artificial knee joint: Secondary | ICD-10-CM | POA: Diagnosis not present

## 2019-10-07 DIAGNOSIS — E039 Hypothyroidism, unspecified: Secondary | ICD-10-CM | POA: Diagnosis not present

## 2019-10-07 DIAGNOSIS — Z87440 Personal history of urinary (tract) infections: Secondary | ICD-10-CM | POA: Diagnosis not present

## 2019-10-07 DIAGNOSIS — Z7984 Long term (current) use of oral hypoglycemic drugs: Secondary | ICD-10-CM | POA: Diagnosis not present

## 2019-10-07 DIAGNOSIS — E119 Type 2 diabetes mellitus without complications: Secondary | ICD-10-CM | POA: Diagnosis not present

## 2019-10-07 DIAGNOSIS — K219 Gastro-esophageal reflux disease without esophagitis: Secondary | ICD-10-CM | POA: Diagnosis not present

## 2019-10-07 DIAGNOSIS — N3281 Overactive bladder: Secondary | ICD-10-CM | POA: Diagnosis not present

## 2019-10-07 DIAGNOSIS — M48061 Spinal stenosis, lumbar region without neurogenic claudication: Secondary | ICD-10-CM | POA: Diagnosis not present

## 2019-10-11 DIAGNOSIS — Z96651 Presence of right artificial knee joint: Secondary | ICD-10-CM | POA: Diagnosis not present

## 2019-10-11 DIAGNOSIS — E785 Hyperlipidemia, unspecified: Secondary | ICD-10-CM | POA: Diagnosis not present

## 2019-10-11 DIAGNOSIS — Z471 Aftercare following joint replacement surgery: Secondary | ICD-10-CM | POA: Diagnosis not present

## 2019-10-11 DIAGNOSIS — M48061 Spinal stenosis, lumbar region without neurogenic claudication: Secondary | ICD-10-CM | POA: Diagnosis not present

## 2019-10-11 DIAGNOSIS — D649 Anemia, unspecified: Secondary | ICD-10-CM | POA: Diagnosis not present

## 2019-10-11 DIAGNOSIS — N3281 Overactive bladder: Secondary | ICD-10-CM | POA: Diagnosis not present

## 2019-10-11 DIAGNOSIS — K219 Gastro-esophageal reflux disease without esophagitis: Secondary | ICD-10-CM | POA: Diagnosis not present

## 2019-10-11 DIAGNOSIS — J45991 Cough variant asthma: Secondary | ICD-10-CM | POA: Diagnosis not present

## 2019-10-11 DIAGNOSIS — Z7951 Long term (current) use of inhaled steroids: Secondary | ICD-10-CM | POA: Diagnosis not present

## 2019-10-11 DIAGNOSIS — Z87891 Personal history of nicotine dependence: Secondary | ICD-10-CM | POA: Diagnosis not present

## 2019-10-11 DIAGNOSIS — Z87440 Personal history of urinary (tract) infections: Secondary | ICD-10-CM | POA: Diagnosis not present

## 2019-10-11 DIAGNOSIS — Z9049 Acquired absence of other specified parts of digestive tract: Secondary | ICD-10-CM | POA: Diagnosis not present

## 2019-10-11 DIAGNOSIS — Z7984 Long term (current) use of oral hypoglycemic drugs: Secondary | ICD-10-CM | POA: Diagnosis not present

## 2019-10-11 DIAGNOSIS — E119 Type 2 diabetes mellitus without complications: Secondary | ICD-10-CM | POA: Diagnosis not present

## 2019-10-11 DIAGNOSIS — Z7982 Long term (current) use of aspirin: Secondary | ICD-10-CM | POA: Diagnosis not present

## 2019-10-11 DIAGNOSIS — E039 Hypothyroidism, unspecified: Secondary | ICD-10-CM | POA: Diagnosis not present

## 2019-10-11 DIAGNOSIS — I10 Essential (primary) hypertension: Secondary | ICD-10-CM | POA: Diagnosis not present

## 2019-10-11 DIAGNOSIS — J449 Chronic obstructive pulmonary disease, unspecified: Secondary | ICD-10-CM | POA: Diagnosis not present

## 2019-10-11 DIAGNOSIS — Z9181 History of falling: Secondary | ICD-10-CM | POA: Diagnosis not present

## 2019-10-13 DIAGNOSIS — Z87891 Personal history of nicotine dependence: Secondary | ICD-10-CM | POA: Diagnosis not present

## 2019-10-13 DIAGNOSIS — E039 Hypothyroidism, unspecified: Secondary | ICD-10-CM | POA: Diagnosis not present

## 2019-10-13 DIAGNOSIS — K219 Gastro-esophageal reflux disease without esophagitis: Secondary | ICD-10-CM | POA: Diagnosis not present

## 2019-10-13 DIAGNOSIS — E119 Type 2 diabetes mellitus without complications: Secondary | ICD-10-CM | POA: Diagnosis not present

## 2019-10-13 DIAGNOSIS — D649 Anemia, unspecified: Secondary | ICD-10-CM | POA: Diagnosis not present

## 2019-10-13 DIAGNOSIS — E785 Hyperlipidemia, unspecified: Secondary | ICD-10-CM | POA: Diagnosis not present

## 2019-10-13 DIAGNOSIS — Z9049 Acquired absence of other specified parts of digestive tract: Secondary | ICD-10-CM | POA: Diagnosis not present

## 2019-10-13 DIAGNOSIS — Z7951 Long term (current) use of inhaled steroids: Secondary | ICD-10-CM | POA: Diagnosis not present

## 2019-10-13 DIAGNOSIS — Z96651 Presence of right artificial knee joint: Secondary | ICD-10-CM | POA: Diagnosis not present

## 2019-10-13 DIAGNOSIS — M48061 Spinal stenosis, lumbar region without neurogenic claudication: Secondary | ICD-10-CM | POA: Diagnosis not present

## 2019-10-13 DIAGNOSIS — Z7984 Long term (current) use of oral hypoglycemic drugs: Secondary | ICD-10-CM | POA: Diagnosis not present

## 2019-10-13 DIAGNOSIS — Z87440 Personal history of urinary (tract) infections: Secondary | ICD-10-CM | POA: Diagnosis not present

## 2019-10-13 DIAGNOSIS — Z9181 History of falling: Secondary | ICD-10-CM | POA: Diagnosis not present

## 2019-10-13 DIAGNOSIS — Z471 Aftercare following joint replacement surgery: Secondary | ICD-10-CM | POA: Diagnosis not present

## 2019-10-13 DIAGNOSIS — I10 Essential (primary) hypertension: Secondary | ICD-10-CM | POA: Diagnosis not present

## 2019-10-13 DIAGNOSIS — J449 Chronic obstructive pulmonary disease, unspecified: Secondary | ICD-10-CM | POA: Diagnosis not present

## 2019-10-13 DIAGNOSIS — J45991 Cough variant asthma: Secondary | ICD-10-CM | POA: Diagnosis not present

## 2019-10-13 DIAGNOSIS — Z7982 Long term (current) use of aspirin: Secondary | ICD-10-CM | POA: Diagnosis not present

## 2019-10-13 DIAGNOSIS — N3281 Overactive bladder: Secondary | ICD-10-CM | POA: Diagnosis not present

## 2019-10-17 DIAGNOSIS — M6281 Muscle weakness (generalized): Secondary | ICD-10-CM | POA: Diagnosis not present

## 2019-10-17 DIAGNOSIS — M25661 Stiffness of right knee, not elsewhere classified: Secondary | ICD-10-CM | POA: Diagnosis not present

## 2019-10-17 DIAGNOSIS — Z96651 Presence of right artificial knee joint: Secondary | ICD-10-CM | POA: Diagnosis not present

## 2019-10-17 DIAGNOSIS — Z9889 Other specified postprocedural states: Secondary | ICD-10-CM | POA: Diagnosis not present

## 2019-10-20 DIAGNOSIS — Z96651 Presence of right artificial knee joint: Secondary | ICD-10-CM | POA: Diagnosis not present

## 2019-10-20 DIAGNOSIS — M6281 Muscle weakness (generalized): Secondary | ICD-10-CM | POA: Diagnosis not present

## 2019-10-20 DIAGNOSIS — M25661 Stiffness of right knee, not elsewhere classified: Secondary | ICD-10-CM | POA: Diagnosis not present

## 2019-10-24 DIAGNOSIS — M6281 Muscle weakness (generalized): Secondary | ICD-10-CM | POA: Diagnosis not present

## 2019-10-24 DIAGNOSIS — Z96651 Presence of right artificial knee joint: Secondary | ICD-10-CM | POA: Diagnosis not present

## 2019-10-24 DIAGNOSIS — M25661 Stiffness of right knee, not elsewhere classified: Secondary | ICD-10-CM | POA: Diagnosis not present

## 2019-10-26 DIAGNOSIS — M25661 Stiffness of right knee, not elsewhere classified: Secondary | ICD-10-CM | POA: Diagnosis not present

## 2019-10-26 DIAGNOSIS — M6281 Muscle weakness (generalized): Secondary | ICD-10-CM | POA: Diagnosis not present

## 2019-10-26 DIAGNOSIS — Z96651 Presence of right artificial knee joint: Secondary | ICD-10-CM | POA: Diagnosis not present

## 2019-10-30 ENCOUNTER — Other Ambulatory Visit: Payer: Self-pay | Admitting: Adult Health

## 2019-10-30 DIAGNOSIS — E119 Type 2 diabetes mellitus without complications: Secondary | ICD-10-CM

## 2019-10-31 DIAGNOSIS — Z96651 Presence of right artificial knee joint: Secondary | ICD-10-CM | POA: Diagnosis not present

## 2019-10-31 DIAGNOSIS — M6281 Muscle weakness (generalized): Secondary | ICD-10-CM | POA: Diagnosis not present

## 2019-10-31 DIAGNOSIS — M25661 Stiffness of right knee, not elsewhere classified: Secondary | ICD-10-CM | POA: Diagnosis not present

## 2019-11-01 ENCOUNTER — Telehealth: Payer: Self-pay | Admitting: Family

## 2019-11-01 DIAGNOSIS — I10 Essential (primary) hypertension: Secondary | ICD-10-CM

## 2019-11-01 DIAGNOSIS — E119 Type 2 diabetes mellitus without complications: Secondary | ICD-10-CM

## 2019-11-01 MED ORDER — LOSARTAN POTASSIUM 50 MG PO TABS: 50.0000 mg | ORAL_TABLET | Freq: Every day | ORAL | 0 refills | Status: DC

## 2019-11-01 MED ORDER — METFORMIN HCL 500 MG PO TABS: 500.0000 mg | ORAL_TABLET | Freq: Every day | ORAL | 0 refills | Status: DC

## 2019-11-01 NOTE — Telephone Encounter (Signed)
Called pt she states she want 90 day scripts on all her medications due to her just having knee surgery and its hardfor her to get out. Inform pt the only med that is rx by [facility] are the two she named. The other meds are under a different provider. Sent 90 day on both meds below.Marland KitchenRaechel [name]

## 2019-11-01 NOTE — Telephone Encounter (Signed)
    Patient wants to discuss med list. Requesting 90 day  refill for: losartan (COZAAR) 50 MG tablet metFORMIN (GLUCOPHAGE) 500 MG tablet  Pharmacy:[facility] [phone] - [place], [place] - [address] AT [facility] [address]

## 2019-11-02 DIAGNOSIS — Z96651 Presence of right artificial knee joint: Secondary | ICD-10-CM | POA: Diagnosis not present

## 2019-11-02 DIAGNOSIS — M6281 Muscle weakness (generalized): Secondary | ICD-10-CM | POA: Diagnosis not present

## 2019-11-02 DIAGNOSIS — M25661 Stiffness of right knee, not elsewhere classified: Secondary | ICD-10-CM | POA: Diagnosis not present

## 2019-11-07 DIAGNOSIS — Z96651 Presence of right artificial knee joint: Secondary | ICD-10-CM | POA: Diagnosis not present

## 2019-11-07 DIAGNOSIS — M6281 Muscle weakness (generalized): Secondary | ICD-10-CM | POA: Diagnosis not present

## 2019-11-07 DIAGNOSIS — M25661 Stiffness of right knee, not elsewhere classified: Secondary | ICD-10-CM | POA: Diagnosis not present

## 2019-11-09 DIAGNOSIS — M6281 Muscle weakness (generalized): Secondary | ICD-10-CM | POA: Diagnosis not present

## 2019-11-09 DIAGNOSIS — M25661 Stiffness of right knee, not elsewhere classified: Secondary | ICD-10-CM | POA: Diagnosis not present

## 2019-11-09 DIAGNOSIS — Z96651 Presence of right artificial knee joint: Secondary | ICD-10-CM | POA: Diagnosis not present

## 2019-11-14 DIAGNOSIS — M25661 Stiffness of right knee, not elsewhere classified: Secondary | ICD-10-CM | POA: Diagnosis not present

## 2019-11-14 DIAGNOSIS — Z96651 Presence of right artificial knee joint: Secondary | ICD-10-CM | POA: Diagnosis not present

## 2019-11-14 DIAGNOSIS — M6281 Muscle weakness (generalized): Secondary | ICD-10-CM | POA: Diagnosis not present

## 2019-11-17 ENCOUNTER — Other Ambulatory Visit: Payer: Self-pay | Admitting: Family

## 2019-11-17 DIAGNOSIS — Z1231 Encounter for screening mammogram for malignant neoplasm of breast: Secondary | ICD-10-CM

## 2019-11-23 ENCOUNTER — Ambulatory Visit
Admission: RE | Admit: 2019-11-23 | Discharge: 2019-11-23 | Disposition: A | Discharge status: Home / Self Care | Payer: Medicare Other | Source: Ambulatory Visit | Attending: Family | Admitting: Family

## 2019-11-23 ENCOUNTER — Other Ambulatory Visit: Payer: Self-pay

## 2019-11-23 DIAGNOSIS — Z1231 Encounter for screening mammogram for malignant neoplasm of breast: Secondary | ICD-10-CM | POA: Diagnosis not present

## 2019-12-07 ENCOUNTER — Other Ambulatory Visit (INDEPENDENT_AMBULATORY_CARE_PROVIDER_SITE_OTHER): Payer: Medicare Other

## 2019-12-07 DIAGNOSIS — E039 Hypothyroidism, unspecified: Secondary | ICD-10-CM | POA: Diagnosis not present

## 2019-12-07 LAB — T4, FREE: Free T4: 1.21 ng/dL (ref 0.60–1.60)

## 2019-12-07 LAB — TSH: TSH: 3.02 u[IU]/mL (ref 0.35–4.50)

## 2019-12-08 ENCOUNTER — Telehealth: Payer: Self-pay | Admitting: Family

## 2019-12-08 ENCOUNTER — Encounter: Payer: Self-pay | Admitting: Internal Medicine

## 2019-12-08 MED ORDER — CEPHALEXIN 500 MG PO CAPS: 500.0000 mg | ORAL_CAPSULE | Freq: Two times a day (BID) | ORAL | 0 refills | Status: DC

## 2019-12-08 NOTE — Telephone Encounter (Signed)
I will send in Keflex which has worked for her in the past. She will need in office visit if the symptoms persist.

## 2019-12-08 NOTE — Telephone Encounter (Signed)
Patient has been made aware of the medications sent to her pharmacy.

## 2019-12-08 NOTE — Telephone Encounter (Signed)
   Patient requesting RX for UTI She has frequent urination and itching.  Patient states she has appointment with Urology later this month Requesting medication be sent to [facility] [phone] - [place], [place] - [address] AT [facility] OF [address] & [facility]

## 2019-12-28 DIAGNOSIS — N3000 Acute cystitis without hematuria: Secondary | ICD-10-CM | POA: Diagnosis not present

## 2020-01-02 DIAGNOSIS — Z09 Encounter for follow-up examination after completed treatment for conditions other than malignant neoplasm: Secondary | ICD-10-CM | POA: Diagnosis not present

## 2020-01-02 DIAGNOSIS — Z96651 Presence of right artificial knee joint: Secondary | ICD-10-CM | POA: Diagnosis not present

## 2020-01-10 ENCOUNTER — Emergency Department (HOSPITAL_COMMUNITY)
Admission: EM | Admit: 2020-01-10 | Discharge: 2020-01-10 | Disposition: A | Discharge status: Home / Self Care | Payer: Medicare Other | Attending: Emergency Medicine | Admitting: Emergency Medicine

## 2020-01-10 ENCOUNTER — Encounter (HOSPITAL_COMMUNITY): Payer: Self-pay | Admitting: Emergency Medicine

## 2020-01-10 ENCOUNTER — Emergency Department (HOSPITAL_COMMUNITY): Payer: Medicare Other

## 2020-01-10 ENCOUNTER — Other Ambulatory Visit: Payer: Self-pay

## 2020-01-10 DIAGNOSIS — Z7984 Long term (current) use of oral hypoglycemic drugs: Secondary | ICD-10-CM | POA: Insufficient documentation

## 2020-01-10 DIAGNOSIS — I10 Essential (primary) hypertension: Secondary | ICD-10-CM | POA: Diagnosis not present

## 2020-01-10 DIAGNOSIS — Z87891 Personal history of nicotine dependence: Secondary | ICD-10-CM | POA: Diagnosis not present

## 2020-01-10 DIAGNOSIS — E119 Type 2 diabetes mellitus without complications: Secondary | ICD-10-CM | POA: Insufficient documentation

## 2020-01-10 DIAGNOSIS — Z7951 Long term (current) use of inhaled steroids: Secondary | ICD-10-CM | POA: Diagnosis not present

## 2020-01-10 DIAGNOSIS — M542 Cervicalgia: Secondary | ICD-10-CM | POA: Diagnosis not present

## 2020-01-10 DIAGNOSIS — J45991 Cough variant asthma: Secondary | ICD-10-CM | POA: Insufficient documentation

## 2020-01-10 DIAGNOSIS — Z79899 Other long term (current) drug therapy: Secondary | ICD-10-CM | POA: Insufficient documentation

## 2020-01-10 DIAGNOSIS — J441 Chronic obstructive pulmonary disease with (acute) exacerbation: Secondary | ICD-10-CM | POA: Diagnosis not present

## 2020-01-10 DIAGNOSIS — E039 Hypothyroidism, unspecified: Secondary | ICD-10-CM | POA: Diagnosis not present

## 2020-01-10 DIAGNOSIS — M545 Low back pain, unspecified: Secondary | ICD-10-CM | POA: Diagnosis not present

## 2020-01-10 MED ORDER — DICLOFENAC SODIUM 1 % EX GEL
2.0000 g | Freq: Once | CUTANEOUS | Status: AC
  Administered 2020-01-10: 21:00:00 2 g via TOPICAL
  Filled 2020-01-10: qty 100

## 2020-01-10 MED ORDER — TIZANIDINE HCL 4 MG PO TABS
2.0000 mg | ORAL_TABLET | Freq: Once | ORAL | Status: AC
  Administered 2020-01-10: 2 mg via ORAL
  Filled 2020-01-10: qty 1

## 2020-01-10 MED ORDER — TIZANIDINE HCL 2 MG PO TABS: 2.0000 mg | ORAL_TABLET | Freq: Three times a day (TID) | ORAL | 0 refills | Status: AC

## 2020-01-10 NOTE — Discharge Instructions (Signed)
As discussed, your evaluation today has been largely reassuring.  But, it is important that you monitor your condition carefully, and do not hesitate to return to the ED if you develop new, or concerning changes in your condition. ? ?Otherwise, please follow-up with your physician for appropriate ongoing care. ? ?

## 2020-01-10 NOTE — ED Triage Notes (Signed)
Per pt, states having left ear pain radiating down left neck and shoulder-no injury or trauma

## 2020-01-10 NOTE — ED Provider Notes (Signed)
[facility] DEPT Provider Note   CSN: [id number] Arrival date & time: 01/10/20  1301     History Chief Complaint  Patient presents with  . Otalgia  . Neck Pain    Lindsay [name] is a 75 y.o. female.  HPI Patient presents with concern of discomfort in her left lateral neck.  Onset was about 1 week ago, without clear precipitant.  Now, since that time she has had episodes of tingling in her left lateral neck, from the mastoid region inferiorly towards the supraclavicular area.  No hearing loss, no vision changes, no confusion, disorientation, fever, nausea, vomiting, chest pain, dyspnea. She has no history of neck surgery, nor neck trauma. Since onset no clear alleviating, exacerbating factors, and again, symptoms seemingly occur randomly.     Past Medical History:  Diagnosis Date  . Acid reflux   . Anemia   . Arthritis   . Asthma   . COPD (chronic obstructive pulmonary disease) ([place])   . Diabetes mellitus without complication ([place])    TYPE 2   . Hyperglycemia   . Hypertension   . Hypokalemia   . Hypothyroidism   . Obesity   . PONV (postoperative nausea and vomiting)   . Spinal stenosis   . Urinary tract infection, chronic     Patient Active Problem List   Diagnosis Date Noted  . Primary osteoarthritis of right knee 09/30/2019  . Status post total knee replacement, right 09/30/2019  . Pre-operative respiratory examination 09/05/2019  . Anemia, unspecified 07/28/2019  . Right knee pain 07/28/2019  . Hypothyroidism 01/12/2019  . Ganglion of flexor tendon sheath of right middle finger 07/15/2018  . Type 2 diabetes mellitus without complication, without long-term current use of insulin ([place]) 02/11/2018  . Essential hypertension 02/11/2018  . Hyperlipidemia 02/11/2018  . Cough variant asthma 11/03/2017  . Chest pain 05/17/2016  . Obesity 05/17/2016  . COPD (chronic obstructive pulmonary disease) ([place])   . Acid reflux   . Urinary  tract infection, chronic   . Hypokalemia   . COPD exacerbation ([place]) 01/24/2013    Past Surgical History:  Procedure Laterality Date  . ABDOMINAL HYSTERECTOMY    . ANKLE FUSION Left   . CHOLECYSTECTOMY    . 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   Procedure: TOTAL KNEE ARTHROPLASTY;  Surgeon: Dorna Leitz, MD;  Location: [facility] ORS;  Service: Orthopedics;  Laterality: Right;     OB History   No obstetric history on file.     Family History  Problem Relation Age of Onset  . Microcephaly Maternal Grandmother   . Heart disease Maternal Grandmother        Not clear details, patient just knows she took "heart pills"  . Asthma Daughter     Social History   Tobacco Use  . Smoking status: Former Smoker    Packs/day: 0.50    Years: 20.00    Pack years: 10.00    Types: Cigarettes    Quit date: 01/06/1997    Years since quitting: 23.0  . Smokeless tobacco: Never Used  Vaping Use  . Vaping Use: Never used  Substance Use Topics  . Alcohol use: No  . Drug use: No    Home Medications Prior to Admission medications   Medication Sig Start Date End Date Taking? Authorizing Provider  albuterol (VENTOLIN HFA) 108 (90 Base) MCG/ACT inhaler Inhale 2 puffs into the lungs every 6 (six) hours as needed for wheezing or shortness of breath. 09/05/19   Volanda Napoleon,  Earnstine Regal, NP  budesonide-formoterol (SYMBICORT) 80-4.5 MCG/ACT inhaler Inhale 2 puffs into the lungs 2 (two) times daily. 09/05/19   Glenford Bayley, NP  cephALEXin (KEFLEX) 500 MG capsule Take 1 capsule (500 mg total) by mouth 2 (two) times daily. 12/08/19   Olive Bass, FNP  cetirizine (ZYRTEC) 10 MG tablet Take 1 tablet (10 mg total) by mouth daily. FOR ASTHMA Patient taking differently: Take 10 mg by mouth daily.  09/05/19   Glenford Bayley, NP  Cholecalciferol (VITAMIN D3) 50 MCG (2000 UT) capsule Take 1 capsule (2,000 Units total) by mouth daily. Must keep scheduled follow=-up appt for future refills 08/06/19    Medina-Vargas, Monina C, NP  cyclobenzaprine (FLEXERIL) 10 MG tablet Take 1 tablet (10 mg total) by mouth 2 (two) times daily. Patient taking differently: Take 10 mg by mouth 2 (two) times daily as needed for muscle spasms.  08/06/19   Medina-Vargas, Monina C, NP  docusate sodium (COLACE) 100 MG capsule Take 100 mg by mouth daily.     [provider]  docusate sodium (COLACE) 100 MG capsule Take 1 capsule (100 mg total) by mouth 2 (two) times daily as needed (for constipation). 09/30/19 09/29/20  Shanon Payor, PA-C  hydrOXYzine (ATARAX/VISTARIL) 10 MG tablet Take 1 tablet (10 mg total) by mouth 3 (three) times daily as needed. 08/06/19   Medina-Vargas, Monina C, NP  levothyroxine (SYNTHROID) 75 MCG tablet Take 1 tablet (75 mcg total) by mouth daily. 08/12/19   Shamleffer, Konrad Dolores, MD  losartan (COZAAR) 50 MG tablet Take 1 tablet (50 mg total) by mouth daily. 11/01/19   Olive Bass, FNP  Menthol-Methyl Salicylate (SALONPAS PAIN RELIEF PATCH EX) Place 1 patch onto the skin daily as needed (pain).    [provider]  metFORMIN (GLUCOPHAGE) 500 MG tablet Take 1 tablet (500 mg total) by mouth daily. 11/01/19   Olive Bass, FNP  montelukast (SINGULAIR) 10 MG tablet Take 1 tablet (10 mg total) by mouth at bedtime. 09/05/19   Glenford Bayley, NP  NON FORMULARY DIET- REGULAR CCD 07/27/19   [provider]  oxybutynin (DITROPAN) 5 MG tablet Take 1 tablet (5 mg total) by mouth 2 (two) times daily. Patient taking differently: Take 10 mg by mouth daily.  08/06/19   Medina-Vargas, Monina C, NP  oxyCODONE-acetaminophen (PERCOCET) 5-325 MG tablet Take 1 tablet by mouth every 6 (six) hours as needed for severe pain. 09/30/19 09/29/20  Shanon Payor, PA-C  pantoprazole (PROTONIX) 20 MG tablet Take 1 tablet (20 mg total) by mouth daily. 08/06/19   Medina-Vargas, Monina C, NP  polyvinyl alcohol (LIQUIFILM TEARS) 1.4 % ophthalmic solution Place 1 drop into both eyes  as needed for dry eyes. 08/06/19   Medina-Vargas, Monina C, NP  simvastatin (ZOCOR) 40 MG tablet Take 1 tablet (40 mg total) by mouth daily at 6 PM. 08/06/19   Medina-Vargas, Monina C, NP  tiZANidine (ZANAFLEX) 4 MG tablet Take 1 tablet (4 mg total) by mouth every 8 (eight) hours as needed for muscle spasms. 09/30/19 09/29/20  Shanon Payor, PA-C  traMADol (ULTRAM) 50 MG tablet Take 1 tablet (50 mg total) by mouth 2 (two) times daily as needed. 08/06/19   Medina-Vargas, Monina C, NP  trimethoprim (TRIMPEX) 100 MG tablet Take 1 tablet (100 mg total) by mouth daily. 08/06/19   Medina-Vargas, Monina C, NP    Allergies    Codeine, Lisinopril, Morphine and related, and Shellfish allergy  Review of Systems  Review of Systems  Constitutional:       Per HPI, otherwise negative  HENT:       Per HPI, otherwise negative  Respiratory:       Per HPI, otherwise negative  Cardiovascular:       Per HPI, otherwise negative  Gastrointestinal: Negative for vomiting.  Endocrine:       Negative aside from HPI  Genitourinary:       Neg aside from HPI   Musculoskeletal:       Per HPI, otherwise negative  Skin: Negative.   Neurological: Negative for syncope.    Physical Exam Updated Vital Signs BP (!) 150/93 (BP Location: Left Arm)   Pulse 70   Temp 99 F (37.2 C)   Resp 16   SpO2 100%   Physical Exam Vitals and nursing note reviewed.  Constitutional:      General: She is not in acute distress.    Appearance: She is well-developed and well-nourished.  HENT:     Head: Normocephalic and atraumatic.  Eyes:     Extraocular Movements: EOM normal.     Conjunctiva/sclera: Conjunctivae normal.  Neck:   Cardiovascular:     Rate and Rhythm: Normal rate and regular rhythm.  Pulmonary:     Effort: Pulmonary effort is normal. No respiratory distress.     Breath sounds: Normal breath sounds. No stridor.  Abdominal:     General: There is no distension.  Musculoskeletal:        General: No edema.      Cervical back: Full passive range of motion without pain, normal range of motion and neck supple. No edema, erythema, signs of trauma, rigidity, torticollis or crepitus. Muscular tenderness present. No pain with movement or spinous process tenderness. Normal range of motion.  Skin:    General: Skin is warm and dry.  Neurological:     Mental Status: She is alert and oriented to person, place, and time.     Cranial Nerves: No cranial nerve deficit.  Psychiatric:        Mood and Affect: Mood and affect normal.     ED Results / Procedures / Treatments   Labs (all labs ordered are listed, but only abnormal results are displayed) Labs Reviewed - No data to display  EKG None  Radiology DG Cervical Spine Complete  Result Date: 01/10/2020 CLINICAL DATA:  Pain EXAM: CERVICAL SPINE - COMPLETE 4+ VIEW COMPARISON:  None. FINDINGS: There is no evidence of cervical spine fracture or prevertebral soft tissue swelling. Alignment is normal. No other significant bone abnormalities are identified. IMPRESSION: Negative cervical spine radiographs. Electronically Signed   By: Katherine Mantle M.D.   On: 01/10/2020 20:43    Procedures Procedures (including critical care time)  Medications Ordered in ED Medications  tiZANidine (ZANAFLEX) tablet 2 mg (2 mg Oral Not Given 01/10/20 2103)  diclofenac Sodium (VOLTAREN) 1 % topical gel 2 g (2 g Topical Given 01/10/20 2106)    ED Course  I have reviewed the triage vital signs and the nursing notes.  Pertinent labs & imaging results that were available during my care of the patient were reviewed by me and considered in my medical decision making (see chart for details). 9:35 PM On repeat exam the patient is awake, alert, sitting upright. She has received topical anti-inflammatory, has declined oral muscle relaxants, as she is driving. With reassuring x-ray, no neurologic complaints or findings, patient may be suffering from inflammation or radiculopathy,  with minor, intermittent symptoms.  As above, with otherwise reassuring findings, vitals, she is appropriate for discharge with close outpatient follow-up. Final Clinical Impression(s) / ED Diagnoses Final diagnoses:  Neck pain    Rx / DC Orders ED Discharge Orders         Ordered    tiZANidine (ZANAFLEX) 2 MG tablet  3 times daily        01/10/20 2138           Gerhard Munch, MD 01/10/20 2138

## 2020-01-16 ENCOUNTER — Ambulatory Visit: Payer: Medicare Other | Admitting: Family

## 2020-01-17 ENCOUNTER — Ambulatory Visit (INDEPENDENT_AMBULATORY_CARE_PROVIDER_SITE_OTHER): Payer: Medicare Other | Admitting: Family Medicine

## 2020-01-17 ENCOUNTER — Ambulatory Visit: Payer: Medicare Other | Admitting: Family

## 2020-01-17 ENCOUNTER — Other Ambulatory Visit: Payer: Self-pay

## 2020-01-17 ENCOUNTER — Other Ambulatory Visit: Payer: Self-pay | Admitting: Family

## 2020-01-17 VITALS — BP 130/86 | HR 72 | Ht 63.0 in | Wt 191.0 lb

## 2020-01-17 DIAGNOSIS — M62838 Other muscle spasm: Secondary | ICD-10-CM

## 2020-01-17 DIAGNOSIS — E119 Type 2 diabetes mellitus without complications: Secondary | ICD-10-CM

## 2020-01-17 NOTE — Progress Notes (Signed)
    Subjective:    CC: Neck pain  I, Christoper Fabian, LAT, ATC, am serving as scribe for Dr. [name].  HPI: Pt is a 75 y/o female presenting w/ c/o L-sided neck pain x approximately 1 month w/ no known MOI.  She went to [facility] on 01/10/20 w/ these complaints and was prescribed Tizanidine and Voltaren gel.  Today, she locates her pain to L side of c-spine and into L trap and a "funny feeling" in L ear.  Radiating pain: no UE numbness/tingling: no Aggravating factors:  Treatments tried: Tizanidine; Voltaren gel  Diagnostic imaging: C-spine XR- 01/10/20  Pertinent review of Systems: No fevers or chills  Relevant historical information: History right knee replacement September 2021 [facility].  COPD, diabetes, hypertension   Objective:    Vitals:   01/17/20 1112  BP: 130/86  Pulse: 72  SpO2: 99%   General: Well Developed, well nourished, and in no acute distress.   MSK: C-spine normal. Nontender midline. Tender palpation left cervical paraspinal musculature.  Left trapezius is rigid and tender to palpation. Decreased cervical motion to left rotation and lateral flexion.  Upper extremity strength reflexes and sensation are equal and normal throughout.  Lab and Radiology Results  EXAM: CERVICAL SPINE - COMPLETE 4+ VIEW  COMPARISON:  None.  FINDINGS: There is no evidence of cervical spine fracture or prevertebral soft tissue swelling. Alignment is normal. No other significant bone abnormalities are identified.  IMPRESSION: Negative cervical spine radiographs.   Electronically Signed   By: Katherine Mantle M.D.   On: 01/10/2020 20:43  I, [name], personally (independently) visualized and performed the interpretation of the images attached in this note.    Impression and Recommendations:    Assessment and Plan: 75 y.o. female with left lateral neck pain due to cervical muscle spasm and dysfunction.  Patient does not have much  degenerative changes seen on recent x-ray.  It is possible that some of her symptoms are C2 radiculopathy but this is much less likely.   Plan for physical therapy heating pad and TENS unit.  Continue the limited tizanidine that was already prescribed.  Recheck if not improving.  Neck step would be MRI for potential epidural steroid injection or facet injection planning..  Use [facility] physical therapy as she already is familiar with that location having done physical therapy for her knee rehab following total knee replacement.  PDMP not reviewed this encounter. Orders Placed This Encounter  Procedures  . Ambulatory referral to Physical Therapy    Referral Priority:   Routine    Referral Type:   Physical Medicine    Referral Reason:   Specialty Services Required    Requested Specialty:   Physical Therapy    Number of Visits Requested:   1   No orders of the defined types were placed in this encounter.   Discussed warning signs or symptoms. Please see discharge instructions. Patient expresses understanding.   The above documentation has been reviewed and is accurate and complete [name], M.D.

## 2020-01-17 NOTE — Patient Instructions (Signed)
Thank you for coming in today.  Use heat.  Try TENS unit.   I've referred you to Physical Therapy.  Let us know if you don't hear from them in one week.  TENS UNIT: This is helpful for muscle pain and spasm.   Search and Purchase a TENS 7000 2nd edition at  www.tenspros.com or www.Amazon.com It should be less than $30.     TENS unit instructions: Do not shower or bathe with the unit on . Turn the unit off before removing electrodes or batteries . If the electrodes lose stickiness add a drop of water to the electrodes after they are disconnected from the unit and place on plastic sheet. If you continued to have difficulty, call the TENS unit company to purchase more electrodes. . Do not apply lotion on the skin area prior to use. Make sure the skin is clean and dry as this will help prolong the life of the electrodes. . After use, always check skin for unusual red areas, rash or other skin difficulties. If there are any skin problems, does not apply electrodes to the same area. . Never remove the electrodes from the unit by pulling the wires. . Do not use the TENS unit or electrodes other than as directed. . Do not change electrode placement without consultating your therapist or physician. Marland Kitchen Keep 2 fingers with between each electrode. . Wear time ratio is 2:1, on to off times.    For example on for 30 minutes off for 15 minutes and then on for 30 minutes off for 15 minutes     Cervical Strain and Sprain Rehab Ask your health care provider which exercises are safe for you. Do exercises exactly as told by your health care provider and adjust them as directed. It is normal to feel mild stretching, pulling, tightness, or discomfort as you do these exercises. Stop right away if you feel sudden pain or your pain gets worse. Do not begin these exercises until told by your health care provider. Stretching and range-of-motion exercises Cervical side bending 1. Using good posture, sit on a  stable chair or stand up. 2. Without moving your shoulders, slowly tilt your left / right ear to your shoulder until you feel a stretch in the opposite side neck muscles. You should be looking straight ahead. 3. Hold for __________ seconds. 4. Repeat with the other side of your neck. Repeat __________ times. Complete this exercise __________ times a day.   Cervical rotation 1. Using good posture, sit on a stable chair or stand up. 2. Slowly turn your head to the side as if you are looking over your left / right shoulder. ? Keep your eyes level with the ground. ? Stop when you feel a stretch along the side and the back of your neck. 3. Hold for __________ seconds. 4. Repeat this by turning to your other side. Repeat __________ times. Complete this exercise __________ times a day.   Thoracic extension and pectoral stretch 1. Roll a towel or a small blanket so it is about 4 inches (10 cm) in diameter. 2. Lie down on your back on a firm surface. 3. Put the towel lengthwise, under your spine in the middle of your back. It should not be under your shoulder blades. The towel should line up with your spine from your middle back to your lower back. 4. Put your hands behind your head and let your elbows fall out to your sides. 5. Hold for __________ seconds. Repeat  __________ times. Complete this exercise __________ times a day. Strengthening exercises Isometric upper cervical flexion 1. Lie on your back with a thin pillow behind your head and a small rolled-up towel under your neck. 2. Gently tuck your chin toward your chest and nod your head down to look toward your feet. Do not lift your head off the pillow. 3. Hold for __________ seconds. 4. Release the tension slowly. Relax your neck muscles completely before you repeat this exercise. Repeat __________ times. Complete this exercise __________ times a day. Isometric cervical extension 1. Stand about 6 inches (15 cm) away from a wall, with your  back facing the wall. 2. Place a soft object, about 6-8 inches (15-20 cm) in diameter, between the back of your head and the wall. A soft object could be a small pillow, a ball, or a folded towel. 3. Gently tilt your head back and press into the soft object. Keep your jaw and forehead relaxed. 4. Hold for __________ seconds. 5. Release the tension slowly. Relax your neck muscles completely before you repeat this exercise. Repeat __________ times. Complete this exercise __________ times a day.   Posture and body mechanics Body mechanics refers to the movements and positions of your body while you do your daily activities. Posture is part of body mechanics. Good posture and healthy body mechanics can help to relieve stress in your body's tissues and joints. Good posture means that your spine is in its natural S-curve position (your spine is neutral), your shoulders are pulled back slightly, and your head is not tipped forward. The following are general guidelines for applying improved posture and body mechanics to your everyday activities. Sitting 1. When sitting, keep your spine neutral and keep your feet flat on the floor. Use a footrest, if necessary, and keep your thighs parallel to the floor. Avoid rounding your shoulders, and avoid tilting your head forward. 2. When working at a desk or a computer, keep your desk at a height where your hands are slightly lower than your elbows. Slide your chair under your desk so you are close enough to maintain good posture. 3. When working at a computer, place your monitor at a height where you are looking straight ahead and you do not have to tilt your head forward or downward to look at the screen.   Standing  When standing, keep your spine neutral and keep your feet about hip-width apart. Keep a slight bend in your knees. Your ears, shoulders, and hips should line up.  When you do a task in which you stand in one place for a long time, place one foot up on a  stable object that is 2-4 inches (5-10 cm) high, such as a footstool. This helps keep your spine neutral.   Resting When lying down and resting, avoid positions that are most painful for you. Try to support your neck in a neutral position. You can use a contour pillow or a small rolled-up towel. Your pillow should support your neck but not push on it. This information is not intended to replace advice given to you by your health care provider. Make sure you discuss any questions you have with your health care provider. Document Revised: 04/14/2018 Document Reviewed: 09/23/2017 Elsevier Patient Education  2021 [employer].

## 2020-01-25 ENCOUNTER — Ambulatory Visit: Payer: Medicare Other | Admitting: Family

## 2020-01-31 ENCOUNTER — Other Ambulatory Visit: Payer: Self-pay

## 2020-01-31 ENCOUNTER — Encounter: Payer: Self-pay | Admitting: Family

## 2020-01-31 ENCOUNTER — Ambulatory Visit (INDEPENDENT_AMBULATORY_CARE_PROVIDER_SITE_OTHER): Payer: Medicare Other | Admitting: Family

## 2020-01-31 VITALS — BP 134/78 | HR 83 | Temp 98.1°F | Ht 63.0 in | Wt 186.0 lb

## 2020-01-31 DIAGNOSIS — E039 Hypothyroidism, unspecified: Secondary | ICD-10-CM

## 2020-01-31 DIAGNOSIS — N390 Urinary tract infection, site not specified: Secondary | ICD-10-CM | POA: Diagnosis not present

## 2020-01-31 DIAGNOSIS — I10 Essential (primary) hypertension: Secondary | ICD-10-CM | POA: Diagnosis not present

## 2020-01-31 DIAGNOSIS — L299 Pruritus, unspecified: Secondary | ICD-10-CM

## 2020-01-31 DIAGNOSIS — E119 Type 2 diabetes mellitus without complications: Secondary | ICD-10-CM

## 2020-01-31 LAB — COMPREHENSIVE METABOLIC PANEL
ALT: 9 U/L (ref 0–35)
AST: 12 U/L (ref 0–37)
Albumin: 4.4 g/dL (ref 3.5–5.2)
Alkaline Phosphatase: 54 U/L (ref 39–117)
BUN: 26 mg/dL — ABNORMAL HIGH (ref 6–23)
CO2: 28 mEq/L (ref 19–32)
Calcium: 10.6 mg/dL — ABNORMAL HIGH (ref 8.4–10.5)
Chloride: 106 mEq/L (ref 96–112)
Creatinine, Ser: 1.24 mg/dL — ABNORMAL HIGH (ref 0.40–1.20)
GFR: 43.45 mL/min — ABNORMAL LOW (ref >60.00)
Glucose, Bld: 79 mg/dL (ref 70–99)
Potassium: 4.2 mEq/L (ref 3.5–5.1)
Sodium: 140 mEq/L (ref 135–145)
Total Bilirubin: 0.2 mg/dL (ref 0.2–1.2)
Total Protein: 7.7 g/dL (ref 6.0–8.3)

## 2020-01-31 LAB — CBC WITH DIFFERENTIAL/PLATELET
Basophils Absolute: 0.1 10*3/uL (ref 0.0–0.1)
Basophils Relative: 0.7 % (ref 0.0–3.0)
Eosinophils Absolute: 0.6 10*3/uL (ref 0.0–0.7)
Eosinophils Relative: 5.8 % — ABNORMAL HIGH (ref 0.0–5.0)
HCT: 34.6 % — ABNORMAL LOW (ref 36.0–46.0)
Hemoglobin: 11.1 g/dL — ABNORMAL LOW (ref 12.0–15.0)
Lymphocytes Relative: 29.1 % (ref 12.0–46.0)
Lymphs Abs: 2.8 10*3/uL (ref 0.7–4.0)
MCHC: 32.3 g/dL (ref 30.0–36.0)
MCV: 79.5 fl (ref 78.0–100.0)
Monocytes Absolute: 0.7 10*3/uL (ref 0.1–1.0)
Monocytes Relative: 7.4 % (ref 3.0–12.0)
Neutro Abs: 5.5 10*3/uL (ref 1.4–7.7)
Neutrophils Relative %: 57 % (ref 43.0–77.0)
Platelets: 332 10*3/uL (ref 150.0–400.0)
RBC: 4.35 Mil/uL (ref 3.87–5.11)
RDW: 14.8 % (ref 11.5–15.5)
WBC: 9.7 10*3/uL (ref 4.0–10.5)

## 2020-01-31 LAB — HEMOGLOBIN A1C: Hgb A1c MFr Bld: 6 % (ref 4.6–6.5)

## 2020-01-31 MED ORDER — LOSARTAN POTASSIUM 50 MG PO TABS: 50.0000 mg | ORAL_TABLET | Freq: Every day | ORAL | 1 refills | Status: DC

## 2020-01-31 MED ORDER — CYCLOBENZAPRINE HCL 5 MG PO TABS: 5.0000 mg | ORAL_TABLET | Freq: Three times a day (TID) | ORAL | 1 refills | Status: DC | PRN

## 2020-01-31 MED ORDER — HYDROXYZINE HCL 10 MG PO TABS: 10.0000 mg | ORAL_TABLET | Freq: Three times a day (TID) | ORAL | 0 refills | Status: DC | PRN

## 2020-01-31 NOTE — Progress Notes (Signed)
Lindsay [name] is a 75 y.o. female with the following history as recorded in EpicCare:  Patient Active Problem List   Diagnosis Date Noted  . Primary osteoarthritis of right knee 09/30/2019  . Status post total knee replacement, right 09/30/2019  . Pre-operative respiratory examination 09/05/2019  . Anemia, unspecified 07/28/2019  . Right knee pain 07/28/2019  . Hypothyroidism 01/12/2019  . Ganglion of flexor tendon sheath of right middle finger 07/15/2018  . Type 2 diabetes mellitus without complication, without long-term current use of insulin ([place]) 02/11/2018  . Essential hypertension 02/11/2018  . Hyperlipidemia 02/11/2018  . Cough variant asthma 11/03/2017  . Chest pain 05/17/2016  . Obesity 05/17/2016  . COPD (chronic obstructive pulmonary disease) ([place])   . Acid reflux   . Urinary tract infection, chronic   . Hypokalemia   . COPD exacerbation ([place]) 01/24/2013    Current Outpatient Medications  Medication Sig Dispense Refill  . albuterol (VENTOLIN HFA) 108 (90 Base) MCG/ACT inhaler Inhale 2 puffs into the lungs every 6 (six) hours as needed for wheezing or shortness of breath. 8 g 5  . budesonide-formoterol (SYMBICORT) 80-4.5 MCG/ACT inhaler Inhale 2 puffs into the lungs 2 (two) times daily. 6.9 g 5  . cetirizine (ZYRTEC) 10 MG tablet Take 1 tablet (10 mg total) by mouth daily. FOR ASTHMA (Patient taking differently: Take 10 mg by mouth daily.) 30 tablet 5  . Cholecalciferol (VITAMIN D3) 50 MCG (2000 UT) capsule Take 1 capsule (2,000 Units total) by mouth daily. Must keep scheduled follow=-up appt for future refills 30 capsule 0  . cyclobenzaprine (FLEXERIL) 5 MG tablet Take 1 tablet (5 mg total) by mouth 3 (three) times daily as needed for muscle spasms. 30 tablet 1  . docusate sodium (COLACE) 100 MG capsule Take 1 capsule (100 mg total) by mouth 2 (two) times daily as needed (for constipation). 60 capsule 2  . gabapentin (NEURONTIN) 300 MG capsule Take 300 mg by mouth 3  (three) times daily.    Marland Kitchen levothyroxine (SYNTHROID) 75 MCG tablet Take 1 tablet (75 mcg total) by mouth daily. 90 tablet 3  . Menthol-Methyl Salicylate (SALONPAS PAIN RELIEF PATCH EX) Place 1 patch onto the skin daily as needed (pain).    . metFORMIN (GLUCOPHAGE) 500 MG tablet TAKE 1 TABLET(500 MG) BY MOUTH DAILY 90 tablet 0  . montelukast (SINGULAIR) 10 MG tablet Take 1 tablet (10 mg total) by mouth at bedtime. 30 tablet 11  . NON FORMULARY DIET- REGULAR CCD    . oxybutynin (DITROPAN) 5 MG tablet Take 1 tablet (5 mg total) by mouth 2 (two) times daily. (Patient taking differently: Take 10 mg by mouth daily.) 60 tablet 0  . pantoprazole (PROTONIX) 20 MG tablet Take 1 tablet (20 mg total) by mouth daily. 30 tablet 0  . polyvinyl alcohol (LIQUIFILM TEARS) 1.4 % ophthalmic solution Place 1 drop into both eyes as needed for dry eyes. 15 mL 0  . simvastatin (ZOCOR) 40 MG tablet Take 1 tablet (40 mg total) by mouth daily at 6 PM. 30 tablet 0  . hydrOXYzine (ATARAX/VISTARIL) 10 MG tablet Take 1 tablet (10 mg total) by mouth 3 (three) times daily as needed. 30 tablet 0  . losartan (COZAAR) 50 MG tablet Take 1 tablet (50 mg total) by mouth daily. 90 tablet 1   No current facility-administered medications for this visit.    Allergies: Codeine, Lisinopril, Morphine and related, and Shellfish allergy  Past Medical History:  Diagnosis Date  . Acid reflux   .  Anemia   . Arthritis   . Asthma   . COPD (chronic obstructive pulmonary disease) ([place])   . Diabetes mellitus without complication ([place])    TYPE 2   . Hyperglycemia   . Hypertension   . Hypokalemia   . Hypothyroidism   . Obesity   . PONV (postoperative nausea and vomiting)   . Spinal stenosis   . Urinary tract infection, chronic     Past Surgical History:  Procedure Laterality Date  . ABDOMINAL HYSTERECTOMY    . ANKLE FUSION Left   . CHOLECYSTECTOMY    . 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   Procedure: TOTAL KNEE ARTHROPLASTY;   Surgeon: Dorna Leitz, MD;  Location: [facility];  Service: Orthopedics;  Laterality: Right;    Family History  Problem Relation Age of Onset  . Microcephaly Maternal Grandmother   . Heart disease Maternal Grandmother        Not clear details, patient just knows she took "heart pills"  . Asthma Daughter     Social History   Tobacco Use  . Smoking status: Former Smoker    Packs/day: 0.50    Years: 20.00    Pack years: 10.00    Types: Cigarettes    Quit date: 01/06/1997    Years since quitting: 23.0  . Smokeless tobacco: Never Used  Substance Use Topics  . Alcohol use: No    Subjective:   Presents for 6 month follow up on Type 2 Diabetes/ hypertension; no acute concerns today- would like to get her labs checked today; Was treated for UTI in December by her urologist- wants to make sure that infection has cleared; the patient opted to stop taking the daily preventive antibiotic medication;  Would like to see a dermatologist- feels like "she is just itching a lot at night." Symptoms of itching are more noticeable when she is more tired;  Asking for refill on her Flexeril for chronic nec pain- has not started the PT that was recommended by sports medicine;     Objective:  Vitals:   01/31/20 1130  BP: 134/78  Pulse: 83  Temp: 98.1 F (36.7 C)  TempSrc: Oral  SpO2: 97%  Weight: 186 lb (84.4 kg)  Height: 5' 3"  (1.6 m)    General: Well developed, well nourished, in no acute distress  Skin : Warm and dry.  Head: Normocephalic and atraumatic  Eyes: Sclera and conjunctiva clear; pupils round and reactive to light; extraocular movements intact  Oropharynx: Pink, supple. No suspicious lesions  Neck: Supple without thyromegaly, adenopathy  Lungs: Respirations unlabored; clear to auscultation bilaterally without wheeze, rales, rhonchi  CVS exam: normal rate and regular rhythm.  Musculoskeletal: No deformities; no active joint inflammation  Extremities: No edema, cyanosis, clubbing   Vessels: Symmetric bilaterally  Neurologic: Alert and oriented; speech intact; face symmetrical; moves all extremities well; CNII-XII intact without focal deficit    Assessment:  1. Type 2 diabetes mellitus without complication, without long-term current use of insulin ([place])   2. Essential hypertension   3. Pruritus   4. Recurrent UTI   5. Hypothyroidism, unspecified type     Plan:  1. Historically well controlled; check labs today; follow-up in 4 months ( patient likes to be seen every 3-4 months); 2. Stable; refill updated; 3. Refer to dermatology; refill updated on Hydroxyzine; 4. Check urine culture;  5. Stable; TSH is normal; continue with endocrine; stressed that she will need to take Synthroid daily for remainder of her life;  This visit  occurred during the SARS-CoV-2 public health emergency.  Safety protocols were in place, including screening questions prior to the visit, additional usage of staff PPE, and extensive cleaning of exam room while observing appropriate contact time as indicated for disinfecting solutions.     No follow-ups on file.  Orders Placed This Encounter  Procedures  . Urine Culture    Standing Status:   Future    Number of Occurrences:   1    Standing Expiration Date:   01/30/2021  . CBC with Differential/Platelet    Standing Status:   Future    Number of Occurrences:   1    Standing Expiration Date:   01/30/2021  . Comp Met (CMET)    Standing Status:   Future    Number of Occurrences:   1    Standing Expiration Date:   01/30/2021  . Hemoglobin A1c    Standing Status:   Future    Number of Occurrences:   1    Standing Expiration Date:   01/30/2021  . Ambulatory referral to Dermatology    Referral Priority:   Routine    Referral Type:   Consultation    Referral Reason:   Specialty Services Required    Requested Specialty:   Dermatology    Number of Visits Requested:   1    Requested Prescriptions   Signed Prescriptions Disp Refills  .  losartan (COZAAR) 50 MG tablet 90 tablet 1    Sig: Take 1 tablet (50 mg total) by mouth daily.  . hydrOXYzine (ATARAX/VISTARIL) 10 MG tablet 30 tablet 0    Sig: Take 1 tablet (10 mg total) by mouth 3 (three) times daily as needed.  . cyclobenzaprine (FLEXERIL) 5 MG tablet 30 tablet 1    Sig: Take 1 tablet (5 mg total) by mouth 3 (three) times daily as needed for muscle spasms.

## 2020-02-01 LAB — URINE CULTURE

## 2020-02-09 DIAGNOSIS — S134XXD Sprain of ligaments of cervical spine, subsequent encounter: Secondary | ICD-10-CM | POA: Diagnosis not present

## 2020-02-13 ENCOUNTER — Encounter: Payer: Self-pay | Admitting: Internal Medicine

## 2020-02-13 ENCOUNTER — Other Ambulatory Visit: Payer: Self-pay

## 2020-02-13 ENCOUNTER — Ambulatory Visit (INDEPENDENT_AMBULATORY_CARE_PROVIDER_SITE_OTHER): Payer: Medicare Other | Admitting: Internal Medicine

## 2020-02-13 VITALS — BP 120/72 | HR 81 | Ht 63.0 in | Wt 184.2 lb

## 2020-02-13 DIAGNOSIS — E039 Hypothyroidism, unspecified: Secondary | ICD-10-CM | POA: Diagnosis not present

## 2020-02-13 LAB — TSH: TSH: 4.6 u[IU]/mL — ABNORMAL HIGH (ref 0.35–4.50)

## 2020-02-13 NOTE — Progress Notes (Signed)
Name: Lindsay [name]  MRN/ DOB: [id number], 01/06/1946    Age/ Sex: 75 y.o., female     PCP: Olive Bass, FNP   Reason for Endocrinology Evaluation: Hypothyroidism     Initial Endocrinology Clinic Visit: 01/11/2019    PATIENT IDENTIFIER: Lindsay [name] is a 75 y.o., female with a past medical history of HTN, T2DM and Hypothyroidism. She has followed with [facility] since 01/11/2019 for consultative assistance with management of her hypothyroidism.   HISTORICAL SUMMARY: The patient was diagnosed with hypothyroidism in 12/2018 with a TSH 25.71 uIU/mL . Was started on levothyroxine 50 mcg but developed diarrhea that she attributed to LT-4 replacement and stopped it.  She was encouraged to restart it on her initial visit to our clinic  SUBJECTIVE:   Today (02/13/2020):  Lindsay [name] is here for a follow up on hypothyroidism.   Weight has been trending down  Energy level is improved  Has been having back issues  Denies constipation  No local neck symptoms   Had a right knee replacement in 09/2019     Levothyroxine 75 mcg daily    HISTORY:  Past Medical History:  Past Medical History:  Diagnosis Date  . Acid reflux   . Anemia   . Arthritis   . Asthma   . COPD (chronic obstructive pulmonary disease) ([facility])   . Diabetes mellitus without complication ([facility])    TYPE 2   . Hyperglycemia   . Hypertension   . Hypokalemia   . Hypothyroidism   . Obesity   . PONV (postoperative nausea and vomiting)   . Spinal stenosis   . Urinary tract infection, chronic    Past Surgical History:  Past Surgical History:  Procedure Laterality Date  . ABDOMINAL HYSTERECTOMY    . ANKLE FUSION Left   . CHOLECYSTECTOMY    . 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   Procedure: TOTAL KNEE ARTHROPLASTY;  Surgeon: Jodi Geralds, MD;  Location: [facility];  Service: Orthopedics;  Laterality: Right;    Social History:  reports that she quit smoking about 23 years ago. Her  smoking use included cigarettes. She has a 10.00 pack-year smoking history. She has never used smokeless tobacco. She reports that she does not drink alcohol and does not use drugs. Family History:  Family History  Problem Relation Age of Onset  . Microcephaly Maternal Grandmother   . Heart disease Maternal Grandmother        Not clear details, patient just knows she took "heart pills"  . Asthma Daughter      HOME MEDICATIONS: Allergies as of 02/13/2020      Reactions   Codeine Nausea Only   Lisinopril Cough   Morphine And Related Itching   Shellfish Allergy Itching      Medication List       Accurate as of February 13, 2020  9:52 AM. If you have any questions, ask your nurse or doctor.        albuterol 108 (90 Base) MCG/ACT inhaler Commonly known as: VENTOLIN HFA Inhale 2 puffs into the lungs every 6 (six) hours as needed for wheezing or shortness of breath.   budesonide-formoterol 80-4.5 MCG/ACT inhaler Commonly known as: Symbicort Inhale 2 puffs into the lungs 2 (two) times daily.   cetirizine 10 MG tablet Commonly known as: ZYRTEC Take 1 tablet (10 mg total) by mouth daily. FOR ASTHMA What changed: additional instructions   cyclobenzaprine 5 MG tablet Commonly known as: FLEXERIL Take 1 tablet (  5 mg total) by mouth 3 (three) times daily as needed for muscle spasms.   docusate sodium 100 MG capsule Commonly known as: Colace Take 1 capsule (100 mg total) by mouth 2 (two) times daily as needed (for constipation).   gabapentin 300 MG capsule Commonly known as: NEURONTIN Take 300 mg by mouth 3 (three) times daily.   hydrOXYzine 10 MG tablet Commonly known as: ATARAX/VISTARIL Take 1 tablet (10 mg total) by mouth 3 (three) times daily as needed.   levothyroxine 75 MCG tablet Commonly known as: SYNTHROID Take 1 tablet (75 mcg total) by mouth daily.   losartan 50 MG tablet Commonly known as: COZAAR Take 1 tablet (50 mg total) by mouth daily.   metFORMIN 500 MG  tablet Commonly known as: GLUCOPHAGE TAKE 1 TABLET(500 MG) BY MOUTH DAILY   montelukast 10 MG tablet Commonly known as: SINGULAIR Take 1 tablet (10 mg total) by mouth at bedtime.   NON FORMULARY DIET- REGULAR CCD   oxybutynin 5 MG tablet Commonly known as: DITROPAN Take 1 tablet (5 mg total) by mouth 2 (two) times daily. What changed:   how much to take  when to take this   pantoprazole 20 MG tablet Commonly known as: PROTONIX Take 1 tablet (20 mg total) by mouth daily.   polyvinyl alcohol 1.4 % ophthalmic solution Commonly known as: LIQUIFILM TEARS Place 1 drop into both eyes as needed for dry eyes.   SALONPAS PAIN RELIEF PATCH EX Place 1 patch onto the skin daily as needed (pain).   simvastatin 40 MG tablet Commonly known as: ZOCOR Take 1 tablet (40 mg total) by mouth daily at 6 PM.   Vitamin D3 50 MCG (2000 UT) capsule Take 1 capsule (2,000 Units total) by mouth daily. Must keep scheduled follow=-up appt for future refills         OBJECTIVE:   PHYSICAL EXAM: VS: BP 120/72   Pulse 81   Ht 5\' 3"  (1.6 m)   Wt 184 lb 4 oz (83.6 kg)   SpO2 98%   BMI 32.64 kg/m    EXAM: General: Pt appears well and is in NAD  Neck: General: Supple without adenopathy. Thyroid: Thyroid size normal.  No goiter or nodules appreciated.   Lungs: Clear with good BS bilat with no rales, rhonchi, or wheezes  Heart: Auscultation: RRR.  Extremities:  BL LE: No pretibial edema normal ROM and strength.  Mental Status: Judgment, insight: Intact Mood and affect: No depression, anxiety, or agitation     DATA REVIEWED: Results for SHERLON, NIED (MRN Athena Masse) as of 02/13/2020 12:53  Ref. Range 02/13/2020 09:55  TSH Latest Ref Range: 0.35 - 4.50 uIU/mL 4.60 (H)     ASSESSMENT / PLAN / RECOMMENDATIONS:   1. Hypothyroidism:   - Pt is clinically euthyroid  - No local neck symptoms - TSH is slightly elevated , no changes today , suspect imperfect adherence - Pt is questioning  how long she will be taking LT-4 replacement. I explained to the pt that LT-4 replacement is lifelong. Pt not very pleased with this, but we discussed importance of compliance with levothyroxine  - Will recheck in 8 weeks and determine then if the dose needs to be adjusted.    Medications  levothyroxine 75 mcg daily       F/u in 1 yr   Signed electronically by: 04/12/2020, MD  [facility]  [facility] [address] [place], [place] Phone: [phone]  FAX: [phone]      CC: Olive Bass, FNP [address] [place] [address] Phone: [phone]  Fax: [phone]   Return to Endocrinology clinic as below: Future Appointments  Date Time Provider Department Center  03/09/2020 10:00 AM Nyoka Cowden, MD [facility] None  05/02/2020 10:00 AM Olive Bass, FNP [facility] None

## 2020-02-13 NOTE — Patient Instructions (Signed)

## 2020-02-16 DIAGNOSIS — S134XXD Sprain of ligaments of cervical spine, subsequent encounter: Secondary | ICD-10-CM | POA: Diagnosis not present

## 2020-02-20 ENCOUNTER — Telehealth: Payer: Self-pay | Admitting: Internal Medicine

## 2020-02-20 NOTE — Telephone Encounter (Signed)
Patient requests to be called at ph# [phone] re: go over recent lab results

## 2020-02-20 NOTE — Telephone Encounter (Signed)
Spoken to patient. Discuss the lab results that was mailed to her. Patient verbalized understanding.

## 2020-02-23 DIAGNOSIS — S134XXD Sprain of ligaments of cervical spine, subsequent encounter: Secondary | ICD-10-CM | POA: Diagnosis not present

## 2020-02-27 DIAGNOSIS — S134XXD Sprain of ligaments of cervical spine, subsequent encounter: Secondary | ICD-10-CM | POA: Diagnosis not present

## 2020-02-29 DIAGNOSIS — S134XXD Sprain of ligaments of cervical spine, subsequent encounter: Secondary | ICD-10-CM | POA: Diagnosis not present

## 2020-03-05 DIAGNOSIS — S134XXD Sprain of ligaments of cervical spine, subsequent encounter: Secondary | ICD-10-CM | POA: Diagnosis not present

## 2020-03-08 DIAGNOSIS — S134XXD Sprain of ligaments of cervical spine, subsequent encounter: Secondary | ICD-10-CM | POA: Diagnosis not present

## 2020-03-09 ENCOUNTER — Encounter: Payer: Self-pay | Admitting: Internal Medicine

## 2020-03-09 ENCOUNTER — Ambulatory Visit (INDEPENDENT_AMBULATORY_CARE_PROVIDER_SITE_OTHER): Payer: Medicare Other

## 2020-03-09 ENCOUNTER — Other Ambulatory Visit: Payer: Self-pay

## 2020-03-09 ENCOUNTER — Ambulatory Visit (INDEPENDENT_AMBULATORY_CARE_PROVIDER_SITE_OTHER): Payer: Medicare Other | Admitting: Internal Medicine

## 2020-03-09 DIAGNOSIS — J45991 Cough variant asthma: Secondary | ICD-10-CM | POA: Diagnosis not present

## 2020-03-09 DIAGNOSIS — R059 Cough, unspecified: Secondary | ICD-10-CM | POA: Diagnosis not present

## 2020-03-09 LAB — CBC WITH DIFFERENTIAL/PLATELET
Basophils Absolute: 0 10*3/uL (ref 0.0–0.1)
Basophils Relative: 0.5 % (ref 0.0–3.0)
Eosinophils Absolute: 0.4 10*3/uL (ref 0.0–0.7)
Eosinophils Relative: 5.1 % — ABNORMAL HIGH (ref 0.0–5.0)
HCT: 33.2 % — ABNORMAL LOW (ref 36.0–46.0)
Hemoglobin: 10.7 g/dL — ABNORMAL LOW (ref 12.0–15.0)
Lymphocytes Relative: 32.9 % (ref 12.0–46.0)
Lymphs Abs: 2.9 10*3/uL (ref 0.7–4.0)
MCHC: 32.2 g/dL (ref 30.0–36.0)
MCV: 79.8 fl (ref 78.0–100.0)
Monocytes Absolute: 0.7 10*3/uL (ref 0.1–1.0)
Monocytes Relative: 7.5 % (ref 3.0–12.0)
Neutro Abs: 4.7 10*3/uL (ref 1.4–7.7)
Neutrophils Relative %: 54 % (ref 43.0–77.0)
Platelets: 317 10*3/uL (ref 150.0–400.0)
RBC: 4.16 Mil/uL (ref 3.87–5.11)
RDW: 15.7 % — ABNORMAL HIGH (ref 11.5–15.5)
WBC: 8.7 10*3/uL (ref 4.0–10.5)

## 2020-03-09 MED ORDER — FAMOTIDINE 20 MG PO TABS: ORAL_TABLET | ORAL | 11 refills | Status: DC

## 2020-03-09 MED ORDER — METHYLPREDNISOLONE ACETATE 80 MG/ML IJ SUSP
120.0000 mg | Freq: Once | INTRAMUSCULAR | Status: AC
  Administered 2020-03-09: 120 mg via INTRAMUSCULAR

## 2020-03-09 MED ORDER — PANTOPRAZOLE SODIUM 40 MG PO TBEC: 40.0000 mg | DELAYED_RELEASE_TABLET | Freq: Every day | ORAL | 2 refills | Status: DC

## 2020-03-09 NOTE — Patient Instructions (Addendum)
Depomedrol 120 mg IM   Plan A = Automatic = Always=    Symbicort 80 Take 2 puffs first thing in am and then another 2 puffs about 12 hours later.   Work on inhaler technique:  relax and gently blow all the way out then take a nice smooth deep breath back in, triggering the inhaler at same time you start breathing in.  Hold for up to 5 seconds if you can. Blow symbicort out thru nose. Rinse and gargle with water when done.  Pantoprazole (protonix) 40 mg   Take  30-60 min before first meal of the day and Pepcid (famotidine)  20 mg one @  bedtime until return to office - this is the best way to tell whether stomach acid is contributing to your problem.     Plan B = Backup (to supplement plan A, not to replace it) Only use your albuterol inhaler as a rescue medication to be used if you can't catch your breath by resting or doing a relaxed purse lip breathing pattern.  - The less you use it, the better it will work when you need it. - Ok to use the inhaler up to 2 puffs  every 4 hours if you must but call for appointment if use goes up over your usual need - Don't leave home without it !!  (think of it like the spare tire for your car)    GERD (REFLUX)  is an extremely common cause of respiratory symptoms just like yours , many times with no obvious heartburn at all.    It can be treated with medication, but also with lifestyle changes including elevation of the head of your bed (ideally with 6 -8inch blocks under the headboard of your bed),  Smoking cessation, avoidance of late meals, excessive alcohol, and avoid fatty foods, chocolate, peppermint, colas, red wine, and acidic juices such as orange juice.  NO MINT OR MENTHOL PRODUCTS SO NO COUGH DROPS  USE SUGARLESS CANDY INSTEAD (Jolley ranchers or Stover's or Life Savers) or even ice chips will also do - the key is to swallow to prevent all throat clearing. NO OIL BASED VITAMINS - use powdered substitutes.  Avoid fish oil when coughing.   Please  remember to go to the lab and x-ray department  for your tests - we will call you with the results when they are available.       Please schedule a follow up office visit in 4 weeks, sooner if needed  with all medications /inhalers/ solutions in hand so we can verify exactly what you are taking. This includes all medications from all doctors and over the counters

## 2020-03-09 NOTE — Progress Notes (Signed)
Lindsay [name], female    DOB: Apr 06, 1945,    MRN: [id number]    Brief patient profile:  74 yowf quit smoking 01/06/97 with ? Asthma as child took shots as child from health dept and dx of copd as adult (but nl spirometry 11/03/17 on bronchodilators) with new pattern of cough since early Set 2019 so referred to pulmonary clinic 11/03/2017 by Dr   Mikeal Hawthorne - note stopped ACEi ? Oct 17 2017 and spirometry off all rx 12/15/2017 = no significant obst      History of Present Illness  11/03/2017 Pulmonary/ 1st office eval/ Lindsay [name]  Chief Complaint  Patient presents with  . Pulmonary Consult    Referred by Dr. Gwenyth Bouillon.  Pt states she was dxed with Asthma as a child. She c/o cough since early Sept 2019. Her cough is non prod.    prior to sept 2019 was taking "some"  advair/ proair a couple of times a week then since September 2019 using advair max dose bid and albuterol and prednisone and to ER Oct 12 then stopped Lisinopril after that and took prednisone  Doe = MMRC3 = can't walk 100 yards even at a slow pace at a flat grade s stopping due to sob  / back problem  Cough sporadic day > noct / dry and better off acei  Sleep on R  side bed 10 degrees  rec Plan A = Automatic = symbicort 80 up to 2 pffs every 12 hours if cough/ wheeze, short of breath Work on inhaler technique:   Plan B = Backup Only use your albuterol as a rescue medication Plan C = Crisis - only use your albuterol nebulizer if you first try Plan B and it fails to help > ok to use the nebulizer up to every 4 hours but if start needing it regularly call for immediate appointment     12/15/2017  f/u ov/Lindsay [name] re: uacs no symb x sev days  Chief Complaint  Patient presents with  . Follow-up    Cough has improved some. She is using her rescue inhaler 4 x per wk. She has not used her neb recently.   Dyspnea:  Limited more by back pain, walks with cane  Cough: better to her satisfaction p adding gerd rx sev weeks prior to OV     Sleeping: R side down/ electric 10 degree elevate hob SABA use: only when over does it  02: none   rec Continue symbicort 80 up to 2 puffs every 12 hours as needed for breathing/ coughing Work on inhaler technique:  relax and gently blow all the way out then take a nice smooth deep breath back in  Continue reflux/ heartburn meds as you are and discuss this with your PCP   03/09/2020 re-establish  ov/Lindsay [name] re: uacs vs asthma  Did not maintain  rx as above Chief Complaint  Patient presents with  . Follow-up    Increased cough and congestion for 2 months   Dyspnea:  Back still limiting  Cough: varies / better p prednisone but puts on wt so not willing to take again  Sleeping: 10-30 degrees / worse sob/cough when flat  SABA use: using it more up at least twice a day esp hs  02: none  Covid status:   vax x 3    No obvious day to day or daytime variability or assoc excess/ purulent sputum or mucus plugs or hemoptysis or cp or chest tightness, subjective wheeze or overt sinus  or hb symptoms.   Also denies any obvious fluctuation of symptoms with weather or environmental changes or other aggravating or alleviating factors except as outlined above   No unusual exposure hx or h/o childhood pna/ asthma or knowledge of premature birth.  Current Allergies, Complete Past Medical History, Past Surgical History, Family History, and Social History were reviewed in [employer] record.  ROS  The following are not active complaints unless bolded Hoarseness, sore throat, dysphagia, dental problems, itching, sneezing,  nasal congestion or discharge of excess mucus or purulent secretions, ear ache,   fever, chills, sweats, unintended wt loss or wt gain, classically pleuritic or exertional cp,  orthopnea pnd or arm/hand swelling  or leg swelling, presyncope, palpitations, abdominal pain, anorexia, nausea, vomiting, diarrhea  or change in bowel habits or change in bladder habits, change  in stools or change in urine, dysuria, hematuria,  rash, arthralgias, visual complaints, headache, numbness, weakness or ataxia or problems with walking or coordination,  change in mood or  memory.        Current Meds  Medication Sig  . albuterol (VENTOLIN HFA) 108 (90 Base) MCG/ACT inhaler Inhale 2 puffs into the lungs every 6 (six) hours as needed for wheezing or shortness of breath.  . budesonide-formoterol (SYMBICORT) 80-4.5 MCG/ACT inhaler Inhale 2 puffs into the lungs 2 (two) times daily.  . cetirizine (ZYRTEC) 10 MG tablet Take 1 tablet (10 mg total) by mouth daily. FOR ASTHMA (Patient taking differently: Take 10 mg by mouth daily.)  . Cholecalciferol (VITAMIN D3) 50 MCG (2000 UT) capsule Take 1 capsule (2,000 Units total) by mouth daily. Must keep scheduled follow=-up appt for future refills  . cyclobenzaprine (FLEXERIL) 5 MG tablet Take 1 tablet (5 mg total) by mouth 3 (three) times daily as needed for muscle spasms.  Marland Kitchen docusate sodium (COLACE) 100 MG capsule Take 1 capsule (100 mg total) by mouth 2 (two) times daily as needed (for constipation).  . gabapentin (NEURONTIN) 300 MG capsule Take 300 mg by mouth 3 (three) times daily.  . hydrOXYzine (ATARAX/VISTARIL) 10 MG tablet Take 1 tablet (10 mg total) by mouth 3 (three) times daily as needed.  Marland Kitchen levothyroxine (SYNTHROID) 75 MCG tablet Take 1 tablet (75 mcg total) by mouth daily.  Marland Kitchen losartan (COZAAR) 50 MG tablet Take 1 tablet (50 mg total) by mouth daily.  . Menthol-Methyl Salicylate (SALONPAS PAIN RELIEF PATCH EX) Place 1 patch onto the skin daily as needed (pain).  . metFORMIN (GLUCOPHAGE) 500 MG tablet TAKE 1 TABLET(500 MG) BY MOUTH DAILY  . montelukast (SINGULAIR) 10 MG tablet Take 1 tablet (10 mg total) by mouth at bedtime.  . NON FORMULARY DIET- REGULAR CCD  . oxybutynin (DITROPAN) 5 MG tablet Take 1 tablet (5 mg total) by mouth 2 (two) times daily. (Patient taking differently: Take 10 mg by mouth daily.)  . pantoprazole  (PROTONIX) 20 MG tablet Take 1 tablet (20 mg total) by mouth daily.  . polyvinyl alcohol (LIQUIFILM TEARS) 1.4 % ophthalmic solution Place 1 drop into both eyes as needed for dry eyes.  . simvastatin (ZOCOR) 40 MG tablet Take 1 tablet (40 mg total) by mouth daily at 6 PM.  . [DISCONTINUED] levothyroxine (SYNTHROID) 50 MCG tablet TAKE 2 TABLETS BY MOUTH ON SUNDAYS AND 1 TABLET THE REST OF THE WEEK AS DIRECTED                Objective:        03/09/2020  188  12/15/17 207 lb (93.9 kg)  11/03/17 212 lb (96.2 kg)  05/18/16 219 lb 9.6 oz (99.6 kg)     Vital signs reviewed  03/09/2020  - Note at rest 02 sats  100% on RA   General appearance:    Somber bf/ hopeless/ helpless affect   HEENT : pt wearing mask not removed for exam due to covid -19 concerns.    NECK :  without JVD/Nodes/TM/ nl carotid upstrokes bilaterally   LUNGS: no acc muscle use,  Nl contour chest which is clear to A and P bilaterally without cough on insp or exp maneuvers   CV:  RRR  no s3 or murmur or increase in P2, and no edema   ABD:  soft and nontender with nl inspiratory excursion in the supine position. No bruits or organomegaly appreciated, bowel sounds nl  MS:  Nl gait/ ext warm without deformities, calf tenderness, cyanosis or clubbing No obvious joint restrictions   SKIN: warm and dry without lesions    NEURO:  alert, approp, nl sensorium with  no motor or cerebellar deficits apparent.      Labs ordered 03/09/2020  :  allergy profile              Assessment

## 2020-03-11 ENCOUNTER — Encounter: Payer: Self-pay | Admitting: Internal Medicine

## 2020-03-11 NOTE — Assessment & Plan Note (Addendum)
Off acei around mid oct 2019 - Spirometry 11/03/2017  FEV1 2.6 (92%)  Ratio 78  - 11/03/2017  After extensive coaching inhaler device,  effectiveness =    75% try symb 80 2bid prn   - Spirometry 12/15/2017  FEV1 1.6 (94%)  Ratio 75 off all rx s curvature - 12/15/2017  After extensive coaching inhaler device,  effectiveness =    75% (early trigger)   - 03/09/2020  After extensive coaching inhaler device,  effectiveness =    25% so strongly doubt much asthma   Strongly favor uacs over asthma here.  Upper airway cough syndrome (previously labeled PNDS),  is so named because it's frequently impossible to sort out how much is  CR/sinusitis with freq throat clearing (which can be related to primary GERD)   vs  causing  secondary (" extra esophageal")  GERD from wide swings in gastric pressure that occur with throat clearing, often  promoting self use of mint and menthol lozenges that reduce the lower esophageal sphincter tone and exacerbate the problem further in a cyclical fashion.   These are the same pts (now being labeled as having "irritable larynx syndrome" by some cough centers) who not infrequently have a history of having failed to tolerate ace inhibitors (as was the case here) ,  dry powder inhalers or biphosphonates or report having atypical/extraesophageal reflux symptoms that don't respond to standard doses of PPI (as may be the ongoing problem here)   and are easily confused as having aecopd or asthma flares by even experienced allergists/ pulmonologists (myself included).    >>>> rec max gerd rx, continue low dose symbicort and work harder on optimal hfa technique with approp saba:  I spent extra time with pt today reviewing appropriate use of albuterol for prn use on exertion with the following points: 1) saba is for relief of sob that does not improve by walking a slower pace or resting but rather if the pt does not improve after trying this first. 2) If the pt is convinced, as many are,  that saba helps recover from activity faster then it's easy to tell if this is the case by re-challenging : ie stop, take the inhaler, then p 5 minutes try the exact same activity (intensity of workload) that just caused the symptoms and see if they are substantially diminished or not after saba 3) if there is an activity that reproducibly causes the symptoms, try the saba 15 min before the activity on alternate days   If in fact the saba really does help, then fine to continue to use it prn but advised may need to look closer at the maintenance regimen being used to achieve better control of airways disease with exertion.          Each maintenance medication was reviewed in detail including emphasizing most importantly the difference between maintenance and prns and under what circumstances the prns are to be triggered using an action plan format where appropriate.  Total time for H and P, chart review, counseling, and generating customized AVS unique to this office visit / same day charting  > 30 min

## 2020-03-12 LAB — IGE: IgE (Immunoglobulin E), Serum: 66 kU/L (ref <=114)

## 2020-03-12 NOTE — Progress Notes (Signed)
Tried calling the pt and there was no answer- VM full so unable to leave msg.

## 2020-03-14 ENCOUNTER — Telehealth: Payer: Self-pay | Admitting: Family

## 2020-03-14 NOTE — Telephone Encounter (Signed)
Patient is having bladder problems and thinks it is a UTI again. Per patient she has a history of UTI and is normally prescribed the same thing. She is requesting something to be called in to her pharmacy.   Preferred Pharmacy:  [facility] [phone] - [place], [place] - [address] AT [facility] [address] [facility] Phone:  [phone]  Fax:  [phone]

## 2020-03-15 ENCOUNTER — Encounter: Payer: Self-pay | Admitting: *Deleted

## 2020-03-15 ENCOUNTER — Ambulatory Visit: Payer: Medicare Other | Admitting: Family Medicine

## 2020-03-15 DIAGNOSIS — S134XXD Sprain of ligaments of cervical spine, subsequent encounter: Secondary | ICD-10-CM | POA: Diagnosis not present

## 2020-03-15 NOTE — Telephone Encounter (Signed)
I have called the patient and left a voicemail with her daughter to get her scheduled over at [facility] today.

## 2020-03-15 NOTE — Progress Notes (Signed)
ATC, NA and mailed letter.

## 2020-03-15 NOTE — Telephone Encounter (Signed)
Sch OV w/any provider pls Thx

## 2020-03-16 ENCOUNTER — Other Ambulatory Visit: Payer: Self-pay | Admitting: Family

## 2020-03-16 MED ORDER — CEPHALEXIN 500 MG PO CAPS: 500.0000 mg | ORAL_CAPSULE | Freq: Two times a day (BID) | ORAL | 0 refills | Status: DC

## 2020-03-16 NOTE — Telephone Encounter (Signed)
I sent in Keflex for her;  Please make sure she knows I am leaving- we need to get her set up with PCP at this office because I suspect [facility] too far to travel.   She needs OV in May 2022 for diabetes follow-up.

## 2020-03-16 NOTE — Telephone Encounter (Signed)
Patient called and was wondering if something for a UTI could be called in. She was supposed to see Dr. Salomon Fick at [facility] but she cancelled because she didn't know where the office was. She is requesting a call back at [phone]. Please advise

## 2020-03-19 ENCOUNTER — Encounter: Payer: Self-pay | Admitting: Family

## 2020-03-19 ENCOUNTER — Other Ambulatory Visit: Payer: Self-pay | Admitting: Family

## 2020-03-19 DIAGNOSIS — R300 Dysuria: Secondary | ICD-10-CM

## 2020-03-19 NOTE — Telephone Encounter (Signed)
Pt has started taking Keflex & has already made appt with Dr Okey Dupre for 05/31/20. Pt states she wants to leave a specimen after taking abx.

## 2020-03-19 NOTE — Telephone Encounter (Signed)
Pt instructed that once she completes the antibiotic course, she can go to the [facility] location at her convenience for them to collect her sample.  Pt verb understanding & has no additional ques/concerns.

## 2020-03-19 NOTE — Telephone Encounter (Signed)
Order in place for her to go to [facility] at her convenience; she can drop off sample at her convenience.

## 2020-03-21 DIAGNOSIS — S134XXD Sprain of ligaments of cervical spine, subsequent encounter: Secondary | ICD-10-CM | POA: Diagnosis not present

## 2020-03-26 ENCOUNTER — Other Ambulatory Visit: Payer: Medicare Other

## 2020-03-26 DIAGNOSIS — R300 Dysuria: Secondary | ICD-10-CM

## 2020-03-27 LAB — URINE CULTURE
MICRO NUMBER:: 11671070
SPECIMEN QUALITY:: ADEQUATE

## 2020-03-28 ENCOUNTER — Telehealth: Payer: Self-pay | Admitting: *Deleted

## 2020-03-28 ENCOUNTER — Telehealth: Payer: Self-pay | Admitting: Internal Medicine

## 2020-03-28 DIAGNOSIS — S134XXD Sprain of ligaments of cervical spine, subsequent encounter: Secondary | ICD-10-CM | POA: Diagnosis not present

## 2020-03-28 NOTE — Telephone Encounter (Signed)
Called and spoke with patient, advised of results/recommendations per Dr. Sherene Sires from 03/09/20 lab work.  She verbalized understanding.  Nothing further needed.

## 2020-03-28 NOTE — Telephone Encounter (Signed)
Called and spoke with patient.  Advised patient of results/recommendations per Dr. Sherene Sires from 03/09/20.  She has a f/u with Dr. Sherene Sires scheduled for 04/06/20 and will discuss further at this visit.  Nothing further needed.

## 2020-04-05 DIAGNOSIS — S134XXD Sprain of ligaments of cervical spine, subsequent encounter: Secondary | ICD-10-CM | POA: Diagnosis not present

## 2020-04-05 DIAGNOSIS — M1712 Unilateral primary osteoarthritis, left knee: Secondary | ICD-10-CM | POA: Diagnosis not present

## 2020-04-05 DIAGNOSIS — M25561 Pain in right knee: Secondary | ICD-10-CM | POA: Diagnosis not present

## 2020-04-05 DIAGNOSIS — Z96651 Presence of right artificial knee joint: Secondary | ICD-10-CM | POA: Diagnosis not present

## 2020-04-05 DIAGNOSIS — M25562 Pain in left knee: Secondary | ICD-10-CM | POA: Diagnosis not present

## 2020-04-06 ENCOUNTER — Encounter: Payer: Self-pay | Admitting: Internal Medicine

## 2020-04-06 ENCOUNTER — Other Ambulatory Visit: Payer: Self-pay

## 2020-04-06 ENCOUNTER — Ambulatory Visit (INDEPENDENT_AMBULATORY_CARE_PROVIDER_SITE_OTHER): Payer: Medicare Other | Admitting: Internal Medicine

## 2020-04-06 DIAGNOSIS — J45991 Cough variant asthma: Secondary | ICD-10-CM

## 2020-04-06 NOTE — Assessment & Plan Note (Addendum)
Off acei around mid oct 2019 - Spirometry 11/03/2017  FEV1 2.6 (92%)  Ratio 78  - 11/03/2017  After extensive coaching inhaler device,  effectiveness =    75% try symb 80 2bid prn   - Spirometry 12/15/2017  FEV1 1.6 (94%)  Ratio 75 off all rx s curvature - 12/15/2017  After extensive coaching inhaler device,  effectiveness =    75% (early trigger)  - 03/09/2020  After extensive coaching inhaler device,  effectiveness =    25% so strongly doubt much asthma  - Allergy profile No visit date found. >  Eos 0.4 /  IgE  66 > rec allergy eval 04/06/2020 but declined   Despite upper airway symptom flare today her exam is completely nl so rec no change in asthma rx = symbicort 80 2bid plus singulair daily/ max gerd rx and keep working on hfa technique.  Ok to use 1st gen H1 blockers per guidelines but since can't really tolerate them next step is allergy eval  - The proper method of use, as well as anticipated side effects, of a metered-dose inhaler were discussed and demonstrated to the patient using teach back method. Improved effectiveness after extensive coaching during this visit to a level of approximately 25 % from a baseline of 0 % > keep working on hfa as DPI not a good choice in pt with prominent upper airway symptoms  F/u here can be prn refills or flares pending allergy eval which I again strongly rec          Each maintenance medication was reviewed in detail including emphasizing most importantly the difference between maintenance and prns and under what circumstances the prns are to be triggered using an action plan format where appropriate.  Total time for H and P, chart review, counseling, reviewing hfa  device(s) and generating customized AVS unique to this summary final regular f/u  office visit / same day charting  > 30 min

## 2020-04-06 NOTE — Progress Notes (Signed)
Lindsay [name], female    DOB: 07-23-1945,    MRN: [id number]    Brief patient profile:  74 yowf quit smoking 01/06/97 with ? Asthma as child took shots as child from health dept and dx of copd as adult (but nl spirometry 11/03/17 on bronchodilators) with new pattern of cough since early Set 2019 so referred to pulmonary clinic 11/03/2017 by Dr   Mikeal Hawthorne - note stopped ACEi ? Oct 17 2017 and spirometry off all rx 12/15/2017 = no significant obst      History of Present Illness  11/03/2017 Pulmonary/ 1st office eval/ Lindsay [name]  Chief Complaint  Patient presents with  . Pulmonary Consult    Referred by Dr. Gwenyth Bouillon.  Pt states she was dxed with Asthma as a child. She c/o cough since early Sept 2019. Her cough is non prod.    prior to sept 2019 was taking "some"  advair/ proair a couple of times a week then since September 2019 using advair max dose bid and albuterol and prednisone and to ER Oct 12 then stopped Lisinopril after that and took prednisone  Doe = MMRC3 = can't walk 100 yards even at a slow pace at a flat grade s stopping due to sob  / back problem  Cough sporadic day > noct / dry and better off acei  Sleep on R  side bed 10 degrees  rec Plan A = Automatic = symbicort 80 up to 2 pffs every 12 hours if cough/ wheeze, short of breath Work on inhaler technique:   Plan B = Backup Only use your albuterol as a rescue medication Plan C = Crisis - only use your albuterol nebulizer if you first try Plan B and it fails to help > ok to use the nebulizer up to every 4 hours but if start needing it regularly call for immediate appointment     12/15/2017  f/u ov/Lindsay [name] re: uacs no symb x sev days  Chief Complaint  Patient presents with  . Follow-up    Cough has improved some. She is using her rescue inhaler 4 x per wk. She has not used her neb recently.   Dyspnea:  Limited more by back pain, walks with cane  Cough: better to her satisfaction p adding gerd rx sev weeks prior to OV     Sleeping: R side down/ electric 10 degree elevate hob SABA use: only when over does it  02: none   rec Continue symbicort 80 up to 2 puffs every 12 hours as needed for breathing/ coughing Work on inhaler technique:  relax and gently blow all the way out then take a nice smooth deep breath back in  Continue reflux/ heartburn meds as you are and discuss this with your PCP   03/09/2020 re-establish  ov/Lindsay [name] re: uacs vs asthma  Did not maintain  rx as above Chief Complaint  Patient presents with  . Follow-up    Increased cough and congestion for 2 months   Dyspnea:  Back still limiting  Cough: varies / better p prednisone but puts on wt so not willing to take again  Sleeping: 10-30 degrees / worse sob/cough when flat  SABA use: using it more up at least twice a day esp hs  02: none  Covid status:   vax x 3  rec Depomedrol 120 mg IM  Plan A = Automatic = Always=    Symbicort 80 Take 2 puffs first thing in am and then another 2 puffs about  12 hours later.  Work on inhaler technique:   Pantoprazole (protonix) 40 mg   Take  30-60 min before first meal of the day and Pepcid (famotidine)  20 mg one @  bedtime until return to office   Plan B = Backup (to supplement plan A, not to replace it) Only use your albuterol inhaler as a rescue medication GERD diet  Please schedule a follow up office visit in 4 weeks, sooner if needed  with all medications /inhalers/ solutions in hand so we can verify exactly what you are taking. This includes all medications from all doctors and over the counters   04/06/2020  f/u ov/Lindsay [name] re: cough variant asthma/ chronic rhinitis only resp to benadryl not zyrtec but benadrly makes her sleepy.  pred works but makes her gain wt  Chief Complaint  Patient presents with  . Follow-up    Having congestion, Productive cough with clear mucus, Sob with exertion, no wheezing. Using Symbicort inhaler 4 times a day. Not using albuterol inhaler.   Dyspnea:  Pushed cart around [employer] w/in last  Week - limited by back > sob  Cough: better but sporadic daytime with assoc throat congestion/  more while awake  Sleeping: about 30 degrees - does not wake up with noct cough/ cogestion or have excess am mucus or wheezing or rising  SABA use: not able to use correctly  02: none  Covid status:   S/p moderna x3    No obvious day to day or daytime variability or assoc excess/ purulent sputum or mucus plugs or hemoptysis or cp or chest tightness, subjective wheeze or overt sinus or hb symptoms.   sleeping without nocturnal  or early am exacerbation  of respiratory  c/o's or need for noct saba. Also denies any obvious fluctuation of symptoms with weather or environmental changes or other aggravating or alleviating factors except as outlined above   No unusual exposure hx or h/o childhood pna/ asthma or knowledge of premature birth.  Current Allergies, Complete Past Medical History, Past Surgical History, Family History, and Social History were reviewed in [employer] record.  ROS  The following are not active complaints unless bolded Hoarseness, sore throat, dysphagia, dental problems, itching, sneezing,  nasal congestion or discharge of excess mucus or purulent secretions, ear ache,   fever, chills, sweats, unintended wt loss or wt gain, classically pleuritic or exertional cp,  orthopnea pnd or arm/hand swelling  or leg swelling, presyncope, palpitations, abdominal pain, anorexia, nausea, vomiting, diarrhea  or change in bowel habits or change in bladder habits, change in stools or change in urine, dysuria, hematuria,  rash, arthralgias, visual complaints, headache, numbness, weakness or ataxia or problems with walking or coordination,  change in mood or  memory.        Current Meds  Medication Sig  . albuterol (VENTOLIN HFA) 108 (90 Base) MCG/ACT inhaler Inhale 2 puffs into the lungs every 6 (six) hours as needed for wheezing or shortness of breath.  .  budesonide-formoterol (SYMBICORT) 80-4.5 MCG/ACT inhaler Inhale 2 puffs into the lungs 2 (two) times daily.  . cephALEXin (KEFLEX) 500 MG capsule Take 1 capsule (500 mg total) by mouth 2 (two) times daily.  . cetirizine (ZYRTEC) 10 MG tablet Take 1 tablet (10 mg total) by mouth daily. FOR ASTHMA (Patient taking differently: Take 10 mg by mouth daily.)  . Cholecalciferol (VITAMIN D3) 50 MCG (2000 UT) capsule Take 1 capsule (2,000 Units total) by mouth daily. Must keep scheduled  follow=-up appt for future refills  . cyclobenzaprine (FLEXERIL) 5 MG tablet Take 1 tablet (5 mg total) by mouth 3 (three) times daily as needed for muscle spasms.  Marland Kitchen docusate sodium (COLACE) 100 MG capsule Take 1 capsule (100 mg total) by mouth 2 (two) times daily as needed (for constipation).  . famotidine (PEPCID) 20 MG tablet One after supper  . gabapentin (NEURONTIN) 300 MG capsule Take 300 mg by mouth 3 (three) times daily.  . hydrOXYzine (ATARAX/VISTARIL) 10 MG tablet Take 1 tablet (10 mg total) by mouth 3 (three) times daily as needed.  Marland Kitchen levothyroxine (SYNTHROID) 75 MCG tablet Take 1 tablet (75 mcg total) by mouth daily.  Marland Kitchen losartan (COZAAR) 50 MG tablet Take 1 tablet (50 mg total) by mouth daily.  . Menthol-Methyl Salicylate (SALONPAS PAIN RELIEF PATCH EX) Place 1 patch onto the skin daily as needed (pain).  . metFORMIN (GLUCOPHAGE) 500 MG tablet TAKE 1 TABLET(500 MG) BY MOUTH DAILY  . montelukast (SINGULAIR) 10 MG tablet Take 1 tablet (10 mg total) by mouth at bedtime.  . NON FORMULARY DIET- REGULAR CCD  . oxybutynin (DITROPAN) 5 MG tablet Take 1 tablet (5 mg total) by mouth 2 (two) times daily. (Patient taking differently: Take 10 mg by mouth daily.)  . pantoprazole (PROTONIX) 40 MG tablet Take 1 tablet (40 mg total) by mouth daily. Take 30-60 min before first meal of the day  . polyvinyl alcohol (LIQUIFILM TEARS) 1.4 % ophthalmic solution Place 1 drop into both eyes as needed for dry eyes.  . simvastatin (ZOCOR)  40 MG tablet Take 1 tablet (40 mg total) by mouth daily at 6 PM.             Objective:      04/06/2020         189  03/09/2020        188  12/15/17 207 lb (93.9 kg)  11/03/17 212 lb (96.2 kg)  05/18/16 219 lb 9.6 oz (99.6 kg)    Vital signs reviewed  04/06/2020  - Note at rest 02 sats  98% on RA   General appearance:   Very somber amb bf nad    HEENT : pt wearing mask not removed for exam due to covid -19 concerns.    NECK :  without JVD/Nodes/TM/ nl carotid upstrokes bilaterally   LUNGS: no acc muscle use,  Nl contour chest which is clear to A and P bilaterally without cough on insp or exp maneuvers   CV:  RRR  no s3 or murmur or increase in P2, and no edema   ABD:  soft and nontender with nl inspiratory excursion in the supine position. No bruits or organomegaly appreciated, bowel sounds nl  MS:  Nl gait/ ext warm without deformities, calf tenderness, cyanosis or clubbing No obvious joint restrictions   SKIN: warm and dry without lesions    NEURO:  alert, approp, nl sensorium with  no motor or cerebellar deficits apparent.         I personally reviewed images and agree with radiology impression as follows:  CXR:   03/09/20 pa and lat No active cardiopulmonary disease.     Assessment

## 2020-04-06 NOTE — Patient Instructions (Addendum)
Plan A = Automatic = Always=    symbicort 80 Take 2 puffs first thing in am and then another 2 puffs about 12 hours later.   Work on inhaler technique:  relax and gently blow all the way out then take a nice smooth deep breath back in, triggering the inhaler at same time you start breathing in.  Hold for up to 5 seconds if you can. Blow out thru nose. Rinse and gargle with water when done - remember how golfer warms     Plan B = Backup (to supplement plan A, not to replace it) Only use your albuterol inhaler as a rescue medication to be used if you can't catch your breath by resting or doing a relaxed purse lip breathing pattern.  - The less you use it, the better it will work when you need it. - Ok to use the inhaler up to 2 puffs  every 4 hours if you must but call for appointment if use goes up over your usual need - Don't leave home without it !!  (think of it like the spare tire for your car)   Plan C = Crisis (instead of Plan B but only if Plan B stops working) - only use your albuterol nebulizer if you first try Plan B and it fails to help > ok to use the nebulizer up to every 4 hours but if start needing it regularly call for immediate appointment  I strongly recommend you see an allergist   Pulmonary f/u is as needed

## 2020-04-10 ENCOUNTER — Other Ambulatory Visit: Payer: Self-pay

## 2020-04-10 ENCOUNTER — Other Ambulatory Visit (INDEPENDENT_AMBULATORY_CARE_PROVIDER_SITE_OTHER): Payer: Medicare Other

## 2020-04-10 DIAGNOSIS — E039 Hypothyroidism, unspecified: Secondary | ICD-10-CM

## 2020-04-10 LAB — TSH: TSH: 3.81 u[IU]/mL (ref 0.35–4.50)

## 2020-04-11 ENCOUNTER — Encounter: Payer: Self-pay | Admitting: Internal Medicine

## 2020-04-24 NOTE — Progress Notes (Deleted)
   I, Philbert Riser, LAT, ATC acting as a scribe for Lindsay Graham, MD.  Lindsay [name] is a 75 y.o. female who presents to [facility] at [facility] today for f/u L lateral neck pain. Pt was last seen by Dr. Denyse Amass on 01/17/20 and was advised to use a heating pad, TENS unit, and was referred to PT of which she completed # visits. Pt was also advised to cont limited tizanidine. Today, pt reports   Dx imaging: 01/10/20 C-spine XR  Pertinent review of systems: ***  Relevant historical information: ***   Exam:  There were no vitals taken for this visit. General: Well Developed, well nourished, and in no acute distress.   MSK: ***    Lab and Radiology Results No results found for this or any previous visit (from the past 72 hour(s)). No results found.     Assessment and Plan: 75 y.o. female with ***   PDMP not reviewed this encounter. No orders of the defined types were placed in this encounter.  No orders of the defined types were placed in this encounter.    Discussed warning signs or symptoms. Please see discharge instructions. Patient expresses understanding.   ***

## 2020-04-25 ENCOUNTER — Ambulatory Visit: Payer: Medicare Other | Admitting: Family Medicine

## 2020-04-30 ENCOUNTER — Ambulatory Visit (INDEPENDENT_AMBULATORY_CARE_PROVIDER_SITE_OTHER): Payer: Medicare Other | Admitting: Family Medicine

## 2020-04-30 ENCOUNTER — Other Ambulatory Visit: Payer: Self-pay

## 2020-04-30 ENCOUNTER — Encounter: Payer: Self-pay | Admitting: Family Medicine

## 2020-04-30 VITALS — BP 140/74 | HR 70 | Ht 63.0 in | Wt 190.6 lb

## 2020-04-30 DIAGNOSIS — G8929 Other chronic pain: Secondary | ICD-10-CM | POA: Diagnosis not present

## 2020-04-30 DIAGNOSIS — M62838 Other muscle spasm: Secondary | ICD-10-CM | POA: Diagnosis not present

## 2020-04-30 DIAGNOSIS — M545 Low back pain, unspecified: Secondary | ICD-10-CM

## 2020-04-30 MED ORDER — TIZANIDINE HCL 4 MG PO TABS: 2.0000 mg | ORAL_TABLET | Freq: Three times a day (TID) | ORAL | 1 refills | Status: DC | PRN

## 2020-04-30 NOTE — Patient Instructions (Addendum)
Thank you for coming in today.  I've referred you to Physical Therapy.  Let us know if you don't hear from them in one week.  I think the back injections may help. Let me know if you want me to arrange for that myself or you want to see Dr Modesto Charon again.   TENS UNIT: This is helpful for muscle pain and spasm.   Search and Purchase a TENS 7000 2nd edition at  www.tenspros.com or www.Amazon.com It should be less than $30.     TENS unit instructions: Do not shower or bathe with the unit on . Turn the unit off before removing electrodes or batteries . If the electrodes lose stickiness add a drop of water to the electrodes after they are disconnected from the unit and place on plastic sheet. If you continued to have difficulty, call the TENS unit company to purchase more electrodes. . Do not apply lotion on the skin area prior to use. Make sure the skin is clean and dry as this will help prolong the life of the electrodes. . After use, always check skin for unusual red areas, rash or other skin difficulties. If there are any skin problems, does not apply electrodes to the same area. . Never remove the electrodes from the unit by pulling the wires. . Do not use the TENS unit or electrodes other than as directed. . Do not change electrode placement without consultating your therapist or physician. Marland Kitchen Keep 2 fingers with between each electrode. . Wear time ratio is 2:1, on to off times.    For example on for 30 minutes off for 15 minutes and then on for 30 minutes off for 15 minutes

## 2020-04-30 NOTE — Progress Notes (Signed)
I, Christoper Fabian, LAT, ATC, am serving as scribe for Dr. [name].  Lindsay [name] is a 75 y.o. female who presents to [facility] at [facility] today for neck pain f/u.  She was last seen by Dr. Denyse Amass on 01/17/20 and was referred to PT at [facility].  She was also advised to use heat and TENs.  Since her last visit, pt reports, that she completed PT and she states that she did somewhat better but is still having pain in her L upper trap/neck.  She is also having pain in her lower back due to severe spinal stenosis.  She is having difficulty walking and standing upright.  She has seen Dr. Regino Schultze in the past and had an L-spine MRI.  She states that she does not want back surgery.  Diagnostic testing: C-spine XR- 01/10/20; L-spine MRI- 07/07/19  Pertinent review of systems: No fevers or chills  Relevant historical information: COPD, diabetes   Exam:  BP 140/74 (BP Location: Right Arm, Patient Position: Sitting, Cuff Size: Normal)   Pulse 70   Ht 5\' 3"  (1.6 m)   Wt 190 lb 9.6 oz (86.5 kg)   SpO2 98%   BMI 33.76 kg/m  General: Well Developed, well nourished, and in no acute distress.   MSK: C-spine normal. Nontender midline. Mildly tender palpation left trapezius. Normal cervical motion.  L-spine normal-appearing nontender midline.  Decreased lumbar motion.    Lab and Radiology Results  EXAM: MRI LUMBAR SPINE WITHOUT CONTRAST  TECHNIQUE: Multiplanar, multisequence MR imaging of the lumbar spine was performed. No intravenous contrast was administered.  COMPARISON:  03/15/2012  FINDINGS: Segmentation: Standard lumbar numbering based on the available coverage  Alignment: Dextroscoliosis. Grade 1 anterolisthesis at L4-5 and L5-S1.  Vertebrae:  Mild discogenic endplate edema at 05/15/2012, interval  Conus medullaris and cauda equina: Conus extends to the L1 level. Conus and cauda equina appear normal except for the cauda equina at levels of  degenerative compression.  Paraspinal and other soft tissues: Negative  Disc levels:  T12- L1: A superiorly migrating right paracentral extrusion has regressed. No neural compression  L1-L2: Downward pointing left paracentral extrusion effacing the subarticular recess, chronic and potentially symptomatic. There is asymmetric left facet spurring and left foraminal impingement.  L2-L3: Progressive disc narrowing with bulging and endplate ridging. Degenerative facet spurring. Moderate to advanced spinal stenosis. Moderate left foraminal impingement  L3-L4: Advanced disc narrowing with bulging and ridging. Degenerative facet spurring on both sides. High-grade spinal stenosis when dorsal epidural fat expansion is combined with degenerative factors. Left more than right foraminal impingement.  L4-L5: Facet osteoarthritis with bulky hypertrophy. Mild anterolisthesis. Disc narrowing and bulging with endplate spurring. Spinal stenosis is severe. Moderate bilateral foraminal narrowing  L5-S1:Facet osteoarthritis with spurring on the right more than left. Anterolisthesis and disc bulging. Bilateral subarticular recess narrowing that could affect either S1 nerve root. Moderate right foraminal narrowing.  IMPRESSION: 1. Advanced and diffuse degenerative disease with dextroscoliosis and generalized progression from 2014. 2. 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 Left foraminal impingement. 3. L2-3 moderate to advanced spinal stenosis with left foraminal impingement. 4. L3-4 advanced spinal stenosis and left more than right foraminal impingement 5. L4-5 severe spinal stenosis.   Electronically Signed   By: Y6-3 M.D.   On: 07/08/2019 08:10  I, 09/08/2019, personally (independently) visualized and performed the interpretation of the images attached in this note.     Assessment and Plan: 75 y.o. female  with  Neck pain:  Significantly to moderately improved with PT.  Is been a bit of pain but overall pretty happy with how things are going.  Watchful waiting and continued home exercise program for this.  If needed could consider MRI for potential injection planning.  Chronic low back pain right worse than left.  Patient describes having what sounds like facet medial branch block and ablations by Dr. Regino Schultze at [facility] orthopedics a few years ago.  It sounds like it was pretty successful and I am not quite sure why she was lost to follow-up.  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  After discussion I think it is probably best that she follow back up with Dr. Regino Schultze to proceed with repeat trials of injections.  Happy to arrange for those with [facility] imaging myself however reasonable to continue established management care.  Limited tizanidine.  Check back with me as needed.   PDMP not reviewed this encounter. Orders Placed This Encounter  Procedures  . Ambulatory referral to Orthopedic Surgery    Referral Priority:   Routine    Referral Type:   Surgical    Referral Reason:   Specialty Services Required    Referred to Provider:   Claria Dice, MD    Requested Specialty:   Physical Medicine and Rehabilitation    Number of Visits Requested:   1   Meds ordered this encounter  Medications  . tiZANidine (ZANAFLEX) 4 MG tablet    Sig: Take 0.5-1 tablets (2-4 mg total) by mouth every 8 (eight) hours as needed for muscle spasms.    Dispense:  90 tablet    Refill:  1     Discussed warning signs or symptoms. Please see discharge instructions. Patient expresses understanding.   The above documentation has been reviewed and is accurate and complete [name], M.D.  Total encounter time 30 minutes including face-to-face time with the patient and,  reviewing past medical record, and charting on the date of service.   Treatment plan options and next steps

## 2020-05-02 ENCOUNTER — Ambulatory Visit: Payer: Medicare Other | Admitting: Family

## 2020-05-14 ENCOUNTER — Other Ambulatory Visit: Payer: Self-pay | Admitting: Family

## 2020-05-14 DIAGNOSIS — E119 Type 2 diabetes mellitus without complications: Secondary | ICD-10-CM

## 2020-05-21 DIAGNOSIS — M47816 Spondylosis without myelopathy or radiculopathy, lumbar region: Secondary | ICD-10-CM | POA: Diagnosis not present

## 2020-05-30 ENCOUNTER — Other Ambulatory Visit: Payer: Self-pay | Admitting: Family

## 2020-05-30 DIAGNOSIS — E785 Hyperlipidemia, unspecified: Secondary | ICD-10-CM

## 2020-05-31 ENCOUNTER — Ambulatory Visit (INDEPENDENT_AMBULATORY_CARE_PROVIDER_SITE_OTHER): Payer: Medicare Other | Admitting: Internal Medicine

## 2020-05-31 ENCOUNTER — Other Ambulatory Visit: Payer: Self-pay

## 2020-05-31 ENCOUNTER — Encounter: Payer: Self-pay | Admitting: Internal Medicine

## 2020-05-31 VITALS — BP 130/68 | HR 66 | Temp 98.5°F | Resp 18 | Ht 63.0 in | Wt 188.2 lb

## 2020-05-31 DIAGNOSIS — L299 Pruritus, unspecified: Secondary | ICD-10-CM

## 2020-05-31 DIAGNOSIS — E118 Type 2 diabetes mellitus with unspecified complications: Secondary | ICD-10-CM

## 2020-05-31 DIAGNOSIS — N390 Urinary tract infection, site not specified: Secondary | ICD-10-CM

## 2020-05-31 DIAGNOSIS — E1169 Type 2 diabetes mellitus with other specified complication: Secondary | ICD-10-CM | POA: Diagnosis not present

## 2020-05-31 DIAGNOSIS — J449 Chronic obstructive pulmonary disease, unspecified: Secondary | ICD-10-CM

## 2020-05-31 DIAGNOSIS — I10 Essential (primary) hypertension: Secondary | ICD-10-CM | POA: Diagnosis not present

## 2020-05-31 DIAGNOSIS — Z0000 Encounter for general adult medical examination without abnormal findings: Secondary | ICD-10-CM

## 2020-05-31 DIAGNOSIS — E039 Hypothyroidism, unspecified: Secondary | ICD-10-CM

## 2020-05-31 DIAGNOSIS — E119 Type 2 diabetes mellitus without complications: Secondary | ICD-10-CM | POA: Diagnosis not present

## 2020-05-31 DIAGNOSIS — E785 Hyperlipidemia, unspecified: Secondary | ICD-10-CM

## 2020-05-31 LAB — LIPID PANEL
Cholesterol: 145 mg/dL (ref 0–200)
HDL: 42.6 mg/dL
LDL Cholesterol: 94 mg/dL (ref 0–99)
NonHDL: 102.15
Total CHOL/HDL Ratio: 3
Triglycerides: 40 mg/dL (ref 0.0–149.0)
VLDL: 8 mg/dL (ref 0.0–40.0)

## 2020-05-31 LAB — COMPREHENSIVE METABOLIC PANEL WITH GFR
ALT: 12 U/L (ref 0–35)
AST: 13 U/L (ref 0–37)
Albumin: 4.1 g/dL (ref 3.5–5.2)
Alkaline Phosphatase: 49 U/L (ref 39–117)
BUN: 19 mg/dL (ref 6–23)
CO2: 29 meq/L (ref 19–32)
Calcium: 10 mg/dL (ref 8.4–10.5)
Chloride: 108 meq/L (ref 96–112)
Creatinine, Ser: 0.99 mg/dL (ref 0.40–1.20)
GFR: 56.8 mL/min — ABNORMAL LOW
Glucose, Bld: 73 mg/dL (ref 70–99)
Potassium: 4.1 meq/L (ref 3.5–5.1)
Sodium: 143 meq/L (ref 135–145)
Total Bilirubin: 0.3 mg/dL (ref 0.2–1.2)
Total Protein: 7.5 g/dL (ref 6.0–8.3)

## 2020-05-31 LAB — CBC
HCT: 34.4 % — ABNORMAL LOW (ref 36.0–46.0)
Hemoglobin: 11.2 g/dL — ABNORMAL LOW (ref 12.0–15.0)
MCHC: 32.5 g/dL (ref 30.0–36.0)
MCV: 82 fl (ref 78.0–100.0)
Platelets: 315 10*3/uL (ref 150.0–400.0)
RBC: 4.19 Mil/uL (ref 3.87–5.11)
RDW: 15 % (ref 11.5–15.5)
WBC: 9 10*3/uL (ref 4.0–10.5)

## 2020-05-31 LAB — HEMOGLOBIN A1C: Hgb A1c MFr Bld: 5.9 % (ref 4.6–6.5)

## 2020-05-31 MED ORDER — LOSARTAN POTASSIUM 50 MG PO TABS: 50.0000 mg | ORAL_TABLET | Freq: Every day | ORAL | 3 refills | Status: DC

## 2020-05-31 MED ORDER — TRIAMCINOLONE ACETONIDE 0.1 % EX CREA: 1.0000 "application " | TOPICAL_CREAM | Freq: Two times a day (BID) | CUTANEOUS | 0 refills | Status: DC

## 2020-05-31 MED ORDER — HYDROXYZINE HCL 10 MG PO TABS: 10.0000 mg | ORAL_TABLET | Freq: Three times a day (TID) | ORAL | 0 refills | Status: DC | PRN

## 2020-05-31 MED ORDER — PANTOPRAZOLE SODIUM 40 MG PO TBEC: 40.0000 mg | DELAYED_RELEASE_TABLET | Freq: Every day | ORAL | 3 refills | Status: DC

## 2020-05-31 MED ORDER — SULFAMETHOXAZOLE-TRIMETHOPRIM 800-160 MG PO TABS: 1.0000 | ORAL_TABLET | Freq: Two times a day (BID) | ORAL | 0 refills | Status: DC

## 2020-05-31 MED ORDER — BUDESONIDE-FORMOTEROL FUMARATE 80-4.5 MCG/ACT IN AERO: 2.0000 | INHALATION_SPRAY | Freq: Two times a day (BID) | RESPIRATORY_TRACT | 5 refills | Status: DC

## 2020-05-31 MED ORDER — ALBUTEROL SULFATE HFA 108 (90 BASE) MCG/ACT IN AERS: 2.0000 | INHALATION_SPRAY | Freq: Four times a day (QID) | RESPIRATORY_TRACT | 5 refills | Status: DC | PRN

## 2020-05-31 MED ORDER — METFORMIN HCL 500 MG PO TABS: 1.0000 | ORAL_TABLET | Freq: Every day | ORAL | 3 refills | Status: DC

## 2020-05-31 MED ORDER — DOCUSATE SODIUM 100 MG PO CAPS: 100.0000 mg | ORAL_CAPSULE | Freq: Two times a day (BID) | ORAL | 1 refills | Status: AC | PRN

## 2020-05-31 MED ORDER — SIMVASTATIN 40 MG PO TABS: 40.0000 mg | ORAL_TABLET | Freq: Every day | ORAL | 3 refills | Status: DC

## 2020-05-31 NOTE — Progress Notes (Signed)
   Subjective:   Patient ID: Lindsay [name], female    DOB: Feb 03, 1945, 75 y.o.   MRN: [id number]  HPI The patient is a transfer of care 75 YO female coming in for physical.  PMH, [facility], social history reviewed and updated.  Review of Systems  Constitutional: Negative.   HENT: Negative.   Eyes: Negative.   Respiratory: Negative for cough, chest tightness and shortness of breath.   Cardiovascular: Negative for chest pain, palpitations and leg swelling.  Gastrointestinal: Negative for abdominal distention, abdominal pain, constipation, diarrhea, nausea and vomiting.  Musculoskeletal: Negative.   Skin: Negative.   Neurological: Negative.   Psychiatric/Behavioral: Negative.     Objective:  Physical Exam Constitutional:      Appearance: She is well-developed. She is obese.  HENT:     Head: Normocephalic and atraumatic.  Cardiovascular:     Rate and Rhythm: Normal rate and regular rhythm.  Pulmonary:     Effort: Pulmonary effort is normal. No respiratory distress.     Breath sounds: Normal breath sounds. No wheezing or rales.  Abdominal:     General: Bowel sounds are normal. There is no distension.     Palpations: Abdomen is soft.     Tenderness: There is no abdominal tenderness. There is no rebound.  Musculoskeletal:     Cervical back: Normal range of motion.  Skin:    General: Skin is warm and dry.     Comments: Foot exam  Neurological:     Mental Status: She is alert and oriented to person, place, and time.     Coordination: Coordination normal.     Vitals:   05/31/20 0957  BP: 130/68  Pulse: 66  Resp: 18  Temp: 98.5 F (36.9 C)  TempSrc: Oral  SpO2: 99%  Weight: 188 lb 3.2 oz (85.4 kg)  Height: 5\' 3"  (1.6 m)    This visit occurred during the SARS-CoV-2 public health emergency.  Safety protocols were in place, including screening questions prior to the visit, additional usage of staff PPE, and extensive cleaning of exam room while observing appropriate contact  time as indicated for disinfecting solutions.   Assessment & Plan:

## 2020-05-31 NOTE — Patient Instructions (Addendum)
We have sent in a cream to use on the skin for the itching.  We will check the labs today.  We have sent in the bactrim to take for urine infection if you need it.   Health Maintenance, Female Adopting a healthy lifestyle and getting preventive care are important in promoting health and wellness. Ask your health care provider about:  The right schedule for you to have regular tests and exams.  Things you can do on your own to prevent diseases and keep yourself healthy. What should I know about diet, weight, and exercise? Eat a healthy diet  Eat a diet that includes plenty of vegetables, fruits, low-fat dairy products, and lean protein.  Do not eat a lot of foods that are high in solid fats, added sugars, or sodium.   Maintain a healthy weight Body mass index (BMI) is used to identify weight problems. It estimates body fat based on height and weight. Your health care provider can help determine your BMI and help you achieve or maintain a healthy weight. Get regular exercise Get regular exercise. This is one of the most important things you can do for your health. Most adults should:  Exercise for at least 150 minutes each week. The exercise should increase your heart rate and make you sweat (moderate-intensity exercise).  Do strengthening exercises at least twice a week. This is in addition to the moderate-intensity exercise.  Spend less time sitting. Even light physical activity can be beneficial. Watch cholesterol and blood lipids Have your blood tested for lipids and cholesterol at 73 years of age, then have this test every 5 years. Have your cholesterol levels checked more often if:  Your lipid or cholesterol levels are high.  You are older than 73 years of age.  You are at high risk for heart disease. What should I know about cancer screening? Depending on your health history and family history, you may need to have cancer screening at various ages. This may include  screening for:  Breast cancer.  Cervical cancer.  Colorectal cancer.  Skin cancer.  Lung cancer. What should I know about heart disease, diabetes, and high blood pressure? Blood pressure and heart disease  High blood pressure causes heart disease and increases the risk of stroke. This is more likely to develop in people who have high blood pressure readings, are of African descent, or are overweight.  Have your blood pressure checked: ? Every 3-5 years if you are 82-25 years of age. ? Every year if you are 7 years old or older. Diabetes Have regular diabetes screenings. This checks your fasting blood sugar level. Have the screening done:  Once every three years after age 39 if you are at a normal weight and have a low risk for diabetes.  More often and at a younger age if you are overweight or have a high risk for diabetes. What should I know about preventing infection? Hepatitis B If you have a higher risk for hepatitis B, you should be screened for this virus. Talk with your health care provider to find out if you are at risk for hepatitis B infection. Hepatitis C Testing is recommended for:  Everyone born from [birth year] through 1963.  Anyone with known risk factors for hepatitis C. Sexually transmitted infections (STIs)  Get screened for STIs, including gonorrhea and chlamydia, if: ? You are sexually active and are younger than 73 years of age. ? You are older than 73 years of age and your health care  provider tells you that you are at risk for this type of infection. ? Your sexual activity has changed since you were last screened, and you are at increased risk for chlamydia or gonorrhea. Ask your health care provider if you are at risk.  Ask your health care provider about whether you are at high risk for HIV. Your health care provider may recommend a prescription medicine to help prevent HIV infection. If you choose to take medicine to prevent HIV, you should first get  tested for HIV. You should then be tested every 3 months for as long as you are taking the medicine. Pregnancy  If you are about to stop having your period (premenopausal) and you may become pregnant, seek counseling before you get pregnant.  Take 400 to 800 micrograms (mcg) of folic acid every day if you become pregnant.  Ask for birth control (contraception) if you want to prevent pregnancy. Osteoporosis and menopause Osteoporosis is a disease in which the bones lose minerals and strength with aging. This can result in bone fractures. If you are 58 years old or older, or if you are at risk for osteoporosis and fractures, ask your health care provider if you should:  Be screened for bone loss.  Take a calcium or vitamin D supplement to lower your risk of fractures.  Be given hormone replacement therapy (HRT) to treat symptoms of menopause. Follow these instructions at home: Lifestyle  Do not use any products that contain nicotine or tobacco, such as cigarettes, e-cigarettes, and chewing tobacco. If you need help quitting, ask your health care provider.  Do not use street drugs.  Do not share needles.  Ask your health care provider for help if you need support or information about quitting drugs. Alcohol use  Do not drink alcohol if: ? Your health care provider tells you not to drink. ? You are pregnant, may be pregnant, or are planning to become pregnant.  If you drink alcohol: ? Limit how much you use to 0-1 drink a day. ? Limit intake if you are breastfeeding.  Be aware of how much alcohol is in your drink. In the [place] one drink equals one 12 oz bottle of beer (355 mL), one 5 oz glass of wine (148 mL), or one 1 oz glass of hard liquor (44 mL). General instructions  Schedule regular health, dental, and eye exams.  Stay current with your vaccines.  Tell your health care provider if: ? You often feel depressed. ? You have ever been abused or do not feel safe at  home. Summary  Adopting a healthy lifestyle and getting preventive care are important in promoting health and wellness.  Follow your health care provider's instructions about healthy diet, exercising, and getting tested or screened for diseases.  Follow your health care provider's instructions on monitoring your cholesterol and blood pressure. This information is not intended to replace advice given to you by your health care provider. Make sure you discuss any questions you have with your health care provider. Document Revised: 12/16/2017 Document Reviewed: 12/16/2017 Elsevier Patient Education  2021 [employer].

## 2020-06-01 NOTE — Assessment & Plan Note (Signed)
Checking CMP and adjust as needed. Taking losartan.

## 2020-06-01 NOTE — Assessment & Plan Note (Signed)
Checking lipid panel as none in some time. Adjust simvastatin 40 mg daily as needed. LDL goal <70.

## 2020-06-01 NOTE — Assessment & Plan Note (Signed)
Rx bactrim to take if needed.

## 2020-06-01 NOTE — Assessment & Plan Note (Signed)
Taking metformin and losartan and simvastatin. Foot exam done. Reminded about eye exam. Checking HgA1c and adjust as needed.

## 2020-06-01 NOTE — Assessment & Plan Note (Signed)
Recent labs normal and sees endo for management.

## 2020-06-01 NOTE — Assessment & Plan Note (Signed)
Flu shot yearly. Covid-19 3 shots counseled. Pneumonia complete. Shingrix complete. Tetanus due declines. Cologuard due 2023. Mammogram due 2023, pap smear aged out and dexa aged out of further. Counseled about sun safety and mole surveillance. Counseled about the dangers of distracted driving. Given 10 year screening recommendations.

## 2020-06-01 NOTE — Assessment & Plan Note (Signed)
Refill albuterol which she uses prn.

## 2020-06-12 DIAGNOSIS — M47816 Spondylosis without myelopathy or radiculopathy, lumbar region: Secondary | ICD-10-CM | POA: Diagnosis not present

## 2020-08-03 ENCOUNTER — Other Ambulatory Visit: Payer: Self-pay | Admitting: Internal Medicine

## 2020-08-06 ENCOUNTER — Telehealth: Payer: Self-pay | Admitting: Internal Medicine

## 2020-08-06 DIAGNOSIS — R300 Dysuria: Secondary | ICD-10-CM

## 2020-08-06 NOTE — Telephone Encounter (Signed)
   Patient calling to report UTI symptoms. She is scheduled for appointment 8/4  Can you urine test be ordered today? Patient requesting medication be prescribed   Please advise

## 2020-08-06 NOTE — Telephone Encounter (Signed)
Spoke with the patient and she has been scheduled for 08/07/2020 at 12 pm to leave a urine sample. Patient is aware of appointment date and time.

## 2020-08-06 NOTE — Telephone Encounter (Signed)
Labs placed for urine test

## 2020-08-06 NOTE — Telephone Encounter (Signed)
See below

## 2020-08-07 ENCOUNTER — Other Ambulatory Visit: Payer: Self-pay

## 2020-08-07 ENCOUNTER — Other Ambulatory Visit (INDEPENDENT_AMBULATORY_CARE_PROVIDER_SITE_OTHER): Payer: Medicare Other

## 2020-08-07 DIAGNOSIS — R300 Dysuria: Secondary | ICD-10-CM | POA: Diagnosis not present

## 2020-08-08 LAB — URINE CULTURE

## 2020-08-08 LAB — URINALYSIS, ROUTINE W REFLEX MICROSCOPIC
Bilirubin Urine: NEGATIVE
Hgb urine dipstick: NEGATIVE
Ketones, ur: NEGATIVE
Leukocytes,Ua: NEGATIVE
Nitrite: NEGATIVE
RBC / HPF: NONE SEEN (ref 0–?)
Specific Gravity, Urine: 1.02 (ref 1.000–1.030)
Total Protein, Urine: NEGATIVE
Urine Glucose: NEGATIVE
Urobilinogen, UA: 0.2 (ref 0.0–1.0)
pH: 5.5 (ref 5.0–8.0)

## 2020-08-09 ENCOUNTER — Ambulatory Visit: Payer: Medicare Other | Admitting: Internal Medicine

## 2020-08-16 DIAGNOSIS — M25561 Pain in right knee: Secondary | ICD-10-CM | POA: Diagnosis not present

## 2020-08-16 DIAGNOSIS — M25562 Pain in left knee: Secondary | ICD-10-CM | POA: Diagnosis not present

## 2020-08-31 DIAGNOSIS — R350 Frequency of micturition: Secondary | ICD-10-CM | POA: Diagnosis not present

## 2020-08-31 DIAGNOSIS — N3020 Other chronic cystitis without hematuria: Secondary | ICD-10-CM | POA: Diagnosis not present

## 2020-08-31 DIAGNOSIS — R3915 Urgency of urination: Secondary | ICD-10-CM | POA: Diagnosis not present

## 2020-09-13 DIAGNOSIS — N3020 Other chronic cystitis without hematuria: Secondary | ICD-10-CM | POA: Diagnosis not present

## 2020-09-14 ENCOUNTER — Encounter: Payer: Self-pay | Admitting: Nurse Practitioner

## 2020-09-14 ENCOUNTER — Other Ambulatory Visit: Payer: Self-pay

## 2020-09-14 ENCOUNTER — Ambulatory Visit (INDEPENDENT_AMBULATORY_CARE_PROVIDER_SITE_OTHER): Payer: Medicare Other | Admitting: Nurse Practitioner

## 2020-09-14 VITALS — BP 140/82 | HR 65 | Temp 98.2°F | Ht 63.0 in | Wt 193.2 lb

## 2020-09-14 DIAGNOSIS — L309 Dermatitis, unspecified: Secondary | ICD-10-CM

## 2020-09-14 LAB — COMPREHENSIVE METABOLIC PANEL
ALT: 12 U/L (ref 0–35)
AST: 13 U/L (ref 0–37)
Albumin: 4 g/dL (ref 3.5–5.2)
Alkaline Phosphatase: 49 U/L (ref 39–117)
BUN: 23 mg/dL (ref 6–23)
CO2: 28 mEq/L (ref 19–32)
Calcium: 9.8 mg/dL (ref 8.4–10.5)
Chloride: 107 mEq/L (ref 96–112)
Creatinine, Ser: 0.79 mg/dL (ref 0.40–1.20)
GFR: 74.31 mL/min (ref >60.00)
Glucose, Bld: 97 mg/dL (ref 70–99)
Potassium: 3.6 mEq/L (ref 3.5–5.1)
Sodium: 142 mEq/L (ref 135–145)
Total Bilirubin: 0.4 mg/dL (ref 0.2–1.2)
Total Protein: 7.1 g/dL (ref 6.0–8.3)

## 2020-09-14 LAB — CBC
HCT: 32.6 % — ABNORMAL LOW (ref 36.0–46.0)
Hemoglobin: 10.5 g/dL — ABNORMAL LOW (ref 12.0–15.0)
MCHC: 32.3 g/dL (ref 30.0–36.0)
MCV: 82.2 fl (ref 78.0–100.0)
Platelets: 280 10*3/uL (ref 150.0–400.0)
RBC: 3.97 Mil/uL (ref 3.87–5.11)
RDW: 14.7 % (ref 11.5–15.5)
WBC: 8.1 10*3/uL (ref 4.0–10.5)

## 2020-09-14 MED ORDER — METHYLPREDNISOLONE ACETATE 40 MG/ML IJ SUSP
40.0000 mg | Freq: Once | INTRAMUSCULAR | Status: AC
  Administered 2020-09-14: 40 mg via INTRAMUSCULAR

## 2020-09-14 MED ORDER — HYDROXYZINE HCL 10 MG PO TABS: 10.0000 mg | ORAL_TABLET | Freq: Three times a day (TID) | ORAL | 0 refills | Status: DC | PRN

## 2020-09-14 NOTE — Progress Notes (Signed)
Subjective:  Patient ID: Carmie End, female    DOB: August 07, 1945  Age: 75 y.o. MRN: [id number]  CC:  Chief Complaint  Patient presents with   Eczema    Pt states she had a flare-up w/ her eczema.Marland Kitchen itching all over      HPI  This patient arrives today for the above.  She tells me that she has been experiencing itching and a rash for about 2 weeks.  She has been scratching with her nails, using a washcloth, and using alcohol on the itching areas.  She is also use hydrocortisone cream and her triamcinolone cream.  She tells me the only thing that seems to be helping the itching temporarily is the alcohol.  She is also been eating a lot of muscular and grapes and is not sure if this could be related to her rash.  She is wondering if she can take a steroid shot today to help with the itching.  She is status post cholecystectomy.  She denies any exposure to new cleaning products or detergents.  Past Medical History:  Diagnosis Date   Acid reflux    Anemia    Arthritis    Asthma    COPD (chronic obstructive pulmonary disease) (HCC)    Diabetes mellitus without complication (HCC)    TYPE 2    Hyperglycemia    Hypertension    Hypokalemia    Hypothyroidism    Obesity    PONV (postoperative nausea and vomiting)    Spinal stenosis    Urinary tract infection, chronic       Family History  Problem Relation Age of Onset   Microcephaly Maternal Grandmother    Heart disease Maternal Grandmother        Not clear details, patient just knows she took "heart pills"   Asthma Daughter     Social History   Social History Narrative   Not on file   Social History   Tobacco Use   Smoking status: Former    Packs/day: 0.50    Years: 20.00    Pack years: 10.00    Types: Cigarettes    Quit date: 01/06/1997    Years since quitting: 23.7   Smokeless tobacco: Never  Substance Use Topics   Alcohol use: No     Current Meds  Medication Sig   albuterol (VENTOLIN HFA) 108 (90  Base) MCG/ACT inhaler Inhale 2 puffs into the lungs every 6 (six) hours as needed for wheezing or shortness of breath.   budesonide-formoterol (SYMBICORT) 80-4.5 MCG/ACT inhaler Inhale 2 puffs into the lungs 2 (two) times daily.   cetirizine (ZYRTEC) 10 MG tablet Take 1 tablet (10 mg total) by mouth daily. FOR ASTHMA (Patient taking differently: Take 10 mg by mouth daily.)   Cholecalciferol (VITAMIN D3) 50 MCG (2000 UT) capsule Take 1 capsule (2,000 Units total) by mouth daily. Must keep scheduled follow=-up appt for future refills   docusate sodium (COLACE) 100 MG capsule Take 1 capsule (100 mg total) by mouth 2 (two) times daily as needed (for constipation).   famotidine (PEPCID) 20 MG tablet One after supper   gabapentin (NEURONTIN) 300 MG capsule Take 300 mg by mouth 3 (three) times daily.   levothyroxine (SYNTHROID) 75 MCG tablet TAKE 1 TABLET(75 MCG) BY MOUTH DAILY   losartan (COZAAR) 50 MG tablet Take 1 tablet (50 mg total) by mouth daily.   Menthol-Methyl Salicylate (SALONPAS PAIN RELIEF PATCH EX) Place 1 patch onto the skin daily as  needed (pain).   metFORMIN (GLUCOPHAGE) 500 MG tablet Take 1 tablet (500 mg total) by mouth daily.   montelukast (SINGULAIR) 10 MG tablet Take 1 tablet (10 mg total) by mouth at bedtime.   NON FORMULARY DIET- REGULAR CCD   oxybutynin (DITROPAN) 5 MG tablet Take 1 tablet (5 mg total) by mouth 2 (two) times daily. (Patient taking differently: Take 10 mg by mouth daily.)   pantoprazole (PROTONIX) 40 MG tablet Take 1 tablet (40 mg total) by mouth daily. Take 30-60 min before first meal of the day   polyvinyl alcohol (LIQUIFILM TEARS) 1.4 % ophthalmic solution Place 1 drop into both eyes as needed for dry eyes.   simvastatin (ZOCOR) 40 MG tablet Take 1 tablet (40 mg total) by mouth daily at 6 PM.   tiZANidine (ZANAFLEX) 4 MG tablet Take 0.5-1 tablets (2-4 mg total) by mouth every 8 (eight) hours as needed for muscle spasms.   triamcinolone cream (KENALOG) 0.1 %  Apply 1 application topically 2 (two) times daily.   [DISCONTINUED] hydrOXYzine (ATARAX/VISTARIL) 10 MG tablet Take 1 tablet (10 mg total) by mouth 3 (three) times daily as needed.    ROS:  See HPI   Objective:   Today's Vitals: BP 140/82 (BP Location: Left Arm)   Pulse 65   Temp 98.2 F (36.8 C) (Oral)   Ht _0  (1.6 m)   Wt 193 lb 3.2 oz (87.6 kg)   SpO2 96%   BMI 34.22 kg/m  Vitals with BMI 09/14/2020 05/31/2020 04/30/2020  Height _1  _2  _3   Weight 193 lbs 3 oz 188 lbs 3 oz 190 lbs 10 oz  BMI 34.23 62.83 66.29  Systolic 476 546 503  Diastolic 82 68 74  Pulse 65 66 70     Physical Exam Vitals reviewed.  Constitutional:      General: She is not in acute distress.    Appearance: Normal appearance.  HENT:     Head: Normocephalic and atraumatic.  Neck:     Vascular: No carotid bruit.  Cardiovascular:     Rate and Rhythm: Normal rate and regular rhythm.     Pulses: Normal pulses.     Heart sounds: Normal heart sounds.  Pulmonary:     Effort: Pulmonary effort is normal.     Breath sounds: Normal breath sounds.  Skin:    General: Skin is warm and dry.          Comments: Red lines indicate areas of pruritus as well as rash.  Rash is raised without any lesions and red.  No open or draining areas noted.  Neurological:     General: No focal deficit present.     Mental Status: She is alert and oriented to person, place, and time.  Psychiatric:        Mood and Affect: Mood normal.        Behavior: Behavior normal.        Judgment: Judgment normal.         Assessment and Plan   1. Eczema, unspecified type      Plan: 1.  I did consult with Dr. Sharlet Salina regarding this case.  Will have 40 mg of Depo-Medrol administered IM today.  Patient was told to continue seeing her triamcinolone cream, avoid scratching the areas with her nails (but instead to rub with her fingertips), use cold compress as needed, use her hydroxyzine as needed (refill sent to her  pharmacy), and to use over-the-counter Zyrtec up to  3 times a day (per shared decision making patient also try using it twice a day which I told her is fine).  She was told to call the office if symptoms worsen or do not improve.  We will also check CMP and CBC today.   Tests ordered Orders Placed This Encounter  Procedures   Comp Met (CMET)   CBC      Meds ordered this encounter  Medications   hydrOXYzine (ATARAX/VISTARIL) 10 MG tablet    Sig: Take 1 tablet (10 mg total) by mouth 3 (three) times daily as needed.    Dispense:  30 tablet    Refill:  0    Order Specific Question:   Supervising Provider    Answer:   Binnie Rail [[id number]]   methylPREDNISolone acetate (DEPO-MEDROL) injection 40 mg    Patient to follow-up as scheduled in November or sooner as needed.  Ailene Ards, NP

## 2020-09-18 ENCOUNTER — Telehealth: Payer: Self-pay | Admitting: *Deleted

## 2020-09-18 NOTE — Telephone Encounter (Signed)
-----   Message from Elenore Paddy, NP sent at 09/14/2020  1:07 PM EDT ----- I attempted to call this patient this afternoon to let her know lab work looks stable, but she did not answer the phone and there was no way to leave a voicemail. She continues to be anemic, but no other abnormalities noted. She should follow-up as scheduled or call us with any other acute concerns. Will you try to call her to notify her of the above later this afternoon?

## 2020-09-18 NOTE — Telephone Encounter (Signed)
Tried calling pt again still no answer x's 10 rings.../lmb 

## 2020-09-18 NOTE — Telephone Encounter (Signed)
SEE LAB REPORT PT RETURN CALL GAVE SARAH RESPONSE.Marland Kitchen/LMB

## 2020-09-20 DIAGNOSIS — M1712 Unilateral primary osteoarthritis, left knee: Secondary | ICD-10-CM | POA: Diagnosis not present

## 2020-09-21 DIAGNOSIS — Z96651 Presence of right artificial knee joint: Secondary | ICD-10-CM | POA: Diagnosis not present

## 2020-09-21 DIAGNOSIS — M1712 Unilateral primary osteoarthritis, left knee: Secondary | ICD-10-CM | POA: Diagnosis not present

## 2020-09-21 DIAGNOSIS — M25562 Pain in left knee: Secondary | ICD-10-CM | POA: Diagnosis not present

## 2020-10-06 ENCOUNTER — Other Ambulatory Visit: Payer: Self-pay | Admitting: Primary Care

## 2020-10-09 DIAGNOSIS — M6281 Muscle weakness (generalized): Secondary | ICD-10-CM | POA: Diagnosis not present

## 2020-10-13 ENCOUNTER — Other Ambulatory Visit: Payer: Self-pay | Admitting: Adult Health

## 2020-10-13 ENCOUNTER — Other Ambulatory Visit: Payer: Self-pay | Admitting: Internal Medicine

## 2020-10-23 ENCOUNTER — Other Ambulatory Visit: Payer: Self-pay | Admitting: Internal Medicine

## 2020-10-23 DIAGNOSIS — Z1231 Encounter for screening mammogram for malignant neoplasm of breast: Secondary | ICD-10-CM

## 2020-10-24 ENCOUNTER — Encounter: Payer: Self-pay | Admitting: Internal Medicine

## 2020-10-24 ENCOUNTER — Other Ambulatory Visit: Payer: Self-pay

## 2020-10-24 ENCOUNTER — Ambulatory Visit (INDEPENDENT_AMBULATORY_CARE_PROVIDER_SITE_OTHER): Payer: Medicare Other | Admitting: Internal Medicine

## 2020-10-24 VITALS — BP 128/82 | HR 71 | Resp 18 | Ht 63.0 in | Wt 199.0 lb

## 2020-10-24 DIAGNOSIS — N390 Urinary tract infection, site not specified: Secondary | ICD-10-CM

## 2020-10-24 DIAGNOSIS — R21 Rash and other nonspecific skin eruption: Secondary | ICD-10-CM | POA: Insufficient documentation

## 2020-10-24 DIAGNOSIS — L309 Dermatitis, unspecified: Secondary | ICD-10-CM | POA: Insufficient documentation

## 2020-10-24 DIAGNOSIS — D509 Iron deficiency anemia, unspecified: Secondary | ICD-10-CM

## 2020-10-24 MED ORDER — HYDROXYZINE HCL 10 MG PO TABS: 10.0000 mg | ORAL_TABLET | Freq: Two times a day (BID) | ORAL | 0 refills | Status: DC | PRN

## 2020-10-24 MED ORDER — BETAMETHASONE VALERATE 0.1 % EX OINT: 1.0000 "application " | TOPICAL_OINTMENT | Freq: Two times a day (BID) | CUTANEOUS | 0 refills | Status: DC

## 2020-10-24 MED ORDER — CEPHALEXIN 500 MG PO CAPS: 500.0000 mg | ORAL_CAPSULE | Freq: Two times a day (BID) | ORAL | 0 refills | Status: AC

## 2020-10-24 NOTE — Assessment & Plan Note (Signed)
Checking food allergy profile to see if there are any sensitivities which may be contributing to her rash.

## 2020-10-24 NOTE — Patient Instructions (Addendum)
For the booster shot go to vaccines.gov to find the moderna closer to you.  We have sent in the keflex to take if needed for urine infection 1 pill twice a day.   Your heart sounds normal today.   We have sent in betamethasone ointment to use twice a day on the rash to help clear this up.  Try taking a multivitamin or iron pill daily to see if this helps with the energy.   We will check the labs for the food allergy today.

## 2020-10-24 NOTE — Progress Notes (Signed)
   Subjective:   Patient ID: Lindsay [name], female    DOB: 1945-06-04, 75 y.o.   MRN: [id number]  HPI The patient is a 75 YO female coming in for several concerns.  Review of Systems  Constitutional: Negative.   HENT: Negative.    Eyes: Negative.   Respiratory:  Negative for cough, chest tightness and shortness of breath.   Cardiovascular:  Negative for chest pain, palpitations and leg swelling.  Gastrointestinal:  Negative for abdominal distention, abdominal pain, constipation, diarrhea, nausea and vomiting.  Musculoskeletal: Negative.   Skin:  Positive for rash.  Neurological: Negative.   Psychiatric/Behavioral: Negative.     Objective:  Physical Exam Constitutional:      Appearance: She is well-developed.  HENT:     Head: Normocephalic and atraumatic.  Cardiovascular:     Rate and Rhythm: Normal rate and regular rhythm.  Pulmonary:     Effort: Pulmonary effort is normal. No respiratory distress.     Breath sounds: Normal breath sounds. No wheezing or rales.  Abdominal:     General: Bowel sounds are normal. There is no distension.     Palpations: Abdomen is soft.     Tenderness: There is no abdominal tenderness. There is no rebound.  Musculoskeletal:     Cervical back: Normal range of motion.  Skin:    General: Skin is warm and dry.     Findings: Rash present.  Neurological:     Mental Status: She is alert and oriented to person, place, and time.     Coordination: Coordination normal.    Vitals:   10/24/20 0915  BP: 128/82  Pulse: 71  Resp: 18  SpO2: 91%  Weight: 199 lb (90.3 kg)  Height: 5\' 3"  (1.6 m)   This visit occurred during the SARS-CoV-2 public health emergency.  Safety protocols were in place, including screening questions prior to the visit, additional usage of staff PPE, and extensive cleaning of exam room while observing appropriate contact time as indicated for disinfecting solutions.   Assessment & Plan:

## 2020-10-24 NOTE — Assessment & Plan Note (Signed)
Recent CBC reviewed with patient and worse. Advised to take iron pills daily to help with energy.

## 2020-10-24 NOTE — Assessment & Plan Note (Signed)
Rx keflex to use if needed for infection as she is able to detect this well.

## 2020-10-24 NOTE — Assessment & Plan Note (Addendum)
Rx betamethasone to use twice daily. Referral to dermatology per patient preference for evaluation.

## 2020-10-25 LAB — FOOD ALLERGY PROFILE
Allergen, Salmon, f41: <0.1 kU/L
Almonds: <0.1 kU/L
CLASS: 0
CLASS: 0
CLASS: 0
CLASS: 0
CLASS: 0
CLASS: 0
CLASS: 0
CLASS: 0
CLASS: 0
CLASS: 0
CLASS: 0
Cashew IgE: <0.1 kU/L
Class: 0
Class: 0
Class: 0
Class: 0
Egg White IgE: <0.1 kU/L
Fish Cod: <0.1 kU/L
Hazelnut: <0.1 kU/L
Milk IgE: <0.1 kU/L
Peanut IgE: <0.1 kU/L
Scallop IgE: <0.1 kU/L
Sesame Seed f10: <0.1 kU/L
Shrimp IgE: <0.1 kU/L
Soybean IgE: <0.1 kU/L
Tuna IgE: <0.1 kU/L
Walnut: <0.1 kU/L
Wheat IgE: <0.1 kU/L

## 2020-10-25 LAB — INTERPRETATION:

## 2020-11-14 DIAGNOSIS — N3020 Other chronic cystitis without hematuria: Secondary | ICD-10-CM | POA: Diagnosis not present

## 2020-11-14 DIAGNOSIS — M6281 Muscle weakness (generalized): Secondary | ICD-10-CM | POA: Diagnosis not present

## 2020-11-15 DIAGNOSIS — M25562 Pain in left knee: Secondary | ICD-10-CM | POA: Diagnosis not present

## 2020-11-15 DIAGNOSIS — M1712 Unilateral primary osteoarthritis, left knee: Secondary | ICD-10-CM | POA: Diagnosis not present

## 2020-11-21 DIAGNOSIS — M19071 Primary osteoarthritis, right ankle and foot: Secondary | ICD-10-CM | POA: Diagnosis not present

## 2020-11-23 ENCOUNTER — Ambulatory Visit
Admission: RE | Admit: 2020-11-23 | Discharge: 2020-11-23 | Disposition: A | Discharge status: Home / Self Care | Payer: Medicare Other | Source: Ambulatory Visit | Attending: Internal Medicine | Admitting: Internal Medicine

## 2020-11-23 ENCOUNTER — Other Ambulatory Visit: Payer: Self-pay

## 2020-11-23 DIAGNOSIS — Z1231 Encounter for screening mammogram for malignant neoplasm of breast: Secondary | ICD-10-CM | POA: Diagnosis not present

## 2020-11-27 ENCOUNTER — Encounter: Payer: Self-pay | Admitting: Internal Medicine

## 2020-11-27 DIAGNOSIS — H2513 Age-related nuclear cataract, bilateral: Secondary | ICD-10-CM | POA: Diagnosis not present

## 2020-11-27 DIAGNOSIS — E119 Type 2 diabetes mellitus without complications: Secondary | ICD-10-CM | POA: Diagnosis not present

## 2020-11-27 DIAGNOSIS — H43813 Vitreous degeneration, bilateral: Secondary | ICD-10-CM | POA: Diagnosis not present

## 2020-11-27 LAB — HM DIABETES EYE EXAM

## 2020-12-03 ENCOUNTER — Ambulatory Visit: Payer: Medicare Other | Admitting: Internal Medicine

## 2020-12-24 ENCOUNTER — Telehealth: Payer: Self-pay | Admitting: Internal Medicine

## 2020-12-24 NOTE — Telephone Encounter (Signed)
Patient states she has a uti, patient states provider knows she has utis often  Patient requesting rx for uti  Pharmacy  [facility] [phone] - [place], [place] - [address] [facility] [address] & [address]

## 2020-12-25 MED ORDER — NITROFURANTOIN MONOHYD MACRO 100 MG PO CAPS: 100.0000 mg | ORAL_CAPSULE | Freq: Two times a day (BID) | ORAL | 0 refills | Status: DC

## 2020-12-25 NOTE — Telephone Encounter (Signed)
Sent in macrobid to take 1 pill twice a day for 1 week. If no improvement should have visit.

## 2020-12-25 NOTE — Telephone Encounter (Signed)
Spoke with the patient and is aware of prescription that needs to be picked up.

## 2020-12-25 NOTE — Telephone Encounter (Signed)
Patient calling back   Patient says she needs to hear back from nurse as soon as possible regarding her possible UTI  Please call [phone]

## 2020-12-25 NOTE — Telephone Encounter (Signed)
Spoke with the pt and she stated she is unsure of when her symptoms started. She mentioned that her urine is cloudy and has a odor to it. She would like to have long term prescription on file.

## 2021-01-04 ENCOUNTER — Other Ambulatory Visit: Payer: Self-pay | Admitting: Family

## 2021-01-23 ENCOUNTER — Other Ambulatory Visit: Payer: Self-pay

## 2021-01-23 ENCOUNTER — Ambulatory Visit (INDEPENDENT_AMBULATORY_CARE_PROVIDER_SITE_OTHER): Payer: Medicare Other | Admitting: Podiatry

## 2021-01-23 ENCOUNTER — Ambulatory Visit (INDEPENDENT_AMBULATORY_CARE_PROVIDER_SITE_OTHER): Payer: Medicare Other

## 2021-01-23 DIAGNOSIS — M79671 Pain in right foot: Secondary | ICD-10-CM

## 2021-01-23 DIAGNOSIS — M19071 Primary osteoarthritis, right ankle and foot: Secondary | ICD-10-CM

## 2021-01-23 DIAGNOSIS — L600 Ingrowing nail: Secondary | ICD-10-CM | POA: Diagnosis not present

## 2021-01-23 DIAGNOSIS — M19079 Primary osteoarthritis, unspecified ankle and foot: Secondary | ICD-10-CM

## 2021-01-23 NOTE — Progress Notes (Signed)
Subjective:  Patient ID: Lindsay [name], female    DOB: Nov 24, 1945,  MRN: [id number]  Chief Complaint  Patient presents with   Foot Pain    PT states she has pain around the front of her ankle     76 y.o. female presents with the above complaint.  Patient presents with complaint of right lateral midfoot pain.  Patient states that it been going for quite some time.  This started last year hurts at night use Voltaren gel which helps some she would like to know if there is arthritis that is in there and is painful.  She states gotten bad over the last few months and has progressed to gotten worse.  She would like to discuss treatment options for this.  She has not seen anyone else prior to seeing me.   Review of Systems: Negative except as noted in the HPI. Denies N/V/F/Ch.  Past Medical History:  Diagnosis Date   Acid reflux    Anemia    Arthritis    Asthma    COPD (chronic obstructive pulmonary disease) (HCC)    Diabetes mellitus without complication (HCC)    TYPE 2    Hyperglycemia    Hypertension    Hypokalemia    Hypothyroidism    Obesity    PONV (postoperative nausea and vomiting)    Spinal stenosis    Urinary tract infection, chronic     Current Outpatient Medications:    albuterol (VENTOLIN HFA) 108 (90 Base) MCG/ACT inhaler, Inhale 2 puffs into the lungs every 6 (six) hours as needed for wheezing or shortness of breath., Disp: 8 g, Rfl: 5   betamethasone valerate ointment (VALISONE) 0.1 %, Apply 1 application topically 2 (two) times daily., Disp: 100 g, Rfl: 0   budesonide-formoterol (SYMBICORT) 80-4.5 MCG/ACT inhaler, Inhale 2 puffs into the lungs 2 (two) times daily., Disp: 6.9 g, Rfl: 5   cetirizine (ZYRTEC) 10 MG tablet, Take 1 tablet (10 mg total) by mouth daily. FOR ASTHMA (Patient taking differently: Take 10 mg by mouth daily.), Disp: 30 tablet, Rfl: 5   Cholecalciferol (VITAMIN D3) 50 MCG (2000 UT) capsule, Take 1 capsule (2,000 Units total) by mouth daily. Must  keep scheduled follow=-up appt for future refills, Disp: 30 capsule, Rfl: 0   docusate sodium (COLACE) 100 MG capsule, Take 1 capsule (100 mg total) by mouth 2 (two) times daily as needed (for constipation)., Disp: 180 capsule, Rfl: 1   famotidine (PEPCID) 20 MG tablet, One after supper, Disp: 30 tablet, Rfl: 11   gabapentin (NEURONTIN) 300 MG capsule, Take 300 mg by mouth 3 (three) times daily., Disp: , Rfl:    hydrOXYzine (ATARAX/VISTARIL) 10 MG tablet, Take 1 tablet (10 mg total) by mouth 2 (two) times daily as needed., Disp: 30 tablet, Rfl: 0   levothyroxine (SYNTHROID) 75 MCG tablet, TAKE 1 TABLET(75 MCG) BY MOUTH DAILY, Disp: 90 tablet, Rfl: 0   losartan (COZAAR) 50 MG tablet, Take 1 tablet (50 mg total) by mouth daily., Disp: 90 tablet, Rfl: 3   Menthol-Methyl Salicylate (SALONPAS PAIN RELIEF PATCH EX), Place 1 patch onto the skin daily as needed (pain)., Disp: , Rfl:    metFORMIN (GLUCOPHAGE) 500 MG tablet, Take 1 tablet (500 mg total) by mouth daily., Disp: 90 tablet, Rfl: 3   montelukast (SINGULAIR) 10 MG tablet, TAKE 1 TABLET(10 MG) BY MOUTH AT BEDTIME, Disp: 30 tablet, Rfl: 5   montelukast (SINGULAIR) 10 MG tablet, TAKE 1 TABLET(10 MG) BY MOUTH AT BEDTIME, Disp: 30  tablet, Rfl: 5   nitrofurantoin, macrocrystal-monohydrate, (MACROBID) 100 MG capsule, Take 1 capsule (100 mg total) by mouth 2 (two) times daily., Disp: 14 capsule, Rfl: 0   NON FORMULARY, DIET- REGULAR CCD, Disp: , Rfl:    oxybutynin (DITROPAN) 5 MG tablet, Take 1 tablet (5 mg total) by mouth 2 (two) times daily. (Patient taking differently: Take 10 mg by mouth daily.), Disp: 60 tablet, Rfl: 0   pantoprazole (PROTONIX) 40 MG tablet, Take 1 tablet (40 mg total) by mouth daily. Take 30-60 min before first meal of the day, Disp: 90 tablet, Rfl: 3   polyvinyl alcohol (LIQUIFILM TEARS) 1.4 % ophthalmic solution, Place 1 drop into both eyes as needed for dry eyes., Disp: 15 mL, Rfl: 0   simvastatin (ZOCOR) 40 MG tablet, Take 1  tablet (40 mg total) by mouth daily at 6 PM., Disp: 90 tablet, Rfl: 3   tiZANidine (ZANAFLEX) 4 MG tablet, Take 0.5-1 tablets (2-4 mg total) by mouth every 8 (eight) hours as needed for muscle spasms., Disp: 90 tablet, Rfl: 1   triamcinolone cream (KENALOG) 0.1 %, Apply 1 application topically 2 (two) times daily., Disp: 100 g, Rfl: 0  Social History   Tobacco Use  Smoking Status Former   Packs/day: 0.50   Years: 20.00   Pack years: 10.00   Types: Cigarettes   Quit date: 01/06/1997   Years since quitting: 24.0  Smokeless Tobacco Never    Allergies  Allergen Reactions   Codeine Nausea Only   Lisinopril Cough   Morphine And Related Itching   Shellfish Allergy Itching   Objective:  There were no vitals filed for this visit. There is no height or weight on file to calculate BMI. Constitutional Well developed. Well nourished.  Vascular Dorsalis pedis pulses palpable bilaterally. Posterior tibial pulses palpable bilaterally. Capillary refill normal to all digits.  No cyanosis or clubbing noted. Pedal hair growth normal.  Neurologic Normal speech. Oriented to person, place, and time. Epicritic sensation to light touch grossly present bilaterally.  Dermatologic Nails well groomed and normal in appearance. No open wounds. No skin lesions.  Orthopedic: Pain on palpation of right lateral midfoot.  Clinically unable to appreciate its superficial exostoses leading to excessive pressure to the dorsal skin and therefore the pain.  Negative Tinel's sign.  Negative piano key test.  No calf pain.   Radiographs: 3 views of skeletally mature the right foot: Osteoarthritic changes noted to the midfoot.  Severe pes planovalgus foot structure noted with decreasing calcaneal inclination angle anterior break in the cyma line elevatus present. Assessment:   1. Arthritis of midfoot    Plan:  Patient was evaluated and treated and all questions answered.  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  I discussed shoe gear modification as well as offloading to see if that is beneficial.  If he continues to have pain we will continue doing steroid injection versus versus tried some over-the-counter medication.   -A steroid injection was performed at right lateral midfoot using 1% plain Lidocaine and 10 mg of Kenalog. This was well tolerated.  -We will plan a cam boot immobilization versus MRI/CT scan if there is no improvement

## 2021-01-28 ENCOUNTER — Other Ambulatory Visit: Payer: Self-pay

## 2021-01-28 ENCOUNTER — Ambulatory Visit (INDEPENDENT_AMBULATORY_CARE_PROVIDER_SITE_OTHER): Payer: Medicare Other

## 2021-01-28 DIAGNOSIS — Z0000 Encounter for general adult medical examination without abnormal findings: Secondary | ICD-10-CM | POA: Diagnosis not present

## 2021-01-28 DIAGNOSIS — Z780 Asymptomatic menopausal state: Secondary | ICD-10-CM | POA: Diagnosis not present

## 2021-01-28 NOTE — Progress Notes (Signed)
Subjective:   Lindsay [name] is a 76 y.o. female who presents for Medicare Annual (Subsequent) preventive examination.  I connected with Lindsay [name] today by telephone and verified that I am speaking with the correct person using two identifiers. Location patient: home Location provider: work Persons participating in the virtual visit: patient, provider.   I discussed the limitations, risks, security and privacy concerns of performing an evaluation and management service by telephone and the availability of in person appointments. I also discussed with the patient that there may be a patient responsible charge related to this service. The patient expressed understanding and verbally consented to this telephonic visit.    Interactive audio and video telecommunications were attempted between this provider and patient, however failed, due to patient having technical difficulties OR patient did not have access to video capability.  We continued and completed visit with audio only.    Review of Systems     Cardiac Risk Factors include: advanced age (>[id number], 98>65 women);diabetes mellitus;hypertension;dyslipidemia     Objective:    Today's Vitals   There is no height or weight on file to calculate BMI.  Advanced Directives 01/28/2021 09/30/2019 09/21/2019 09/02/2019 07/28/2019 07/26/2019 07/24/2019  Does Patient Have a Medical Advance Directive? No No No No Yes No No  Type of Advance Directive - - - - (No Data) - -  Does patient want to make changes to medical advance directive? - - - - No - Patient declined - -  Would patient like information on creating a medical advance directive? No - Patient declined No - Patient declined - No - Patient declined - - No - Patient declined    Current Medications (verified) Outpatient Encounter Medications as of 01/28/2021  Medication Sig   albuterol (VENTOLIN HFA) 108 (90 Base) MCG/ACT inhaler Inhale 2 puffs into the lungs every 6 (six) hours as needed  for wheezing or shortness of breath.   betamethasone valerate ointment (VALISONE) 0.1 % Apply 1 application topically 2 (two) times daily.   budesonide-formoterol (SYMBICORT) 80-4.5 MCG/ACT inhaler Inhale 2 puffs into the lungs 2 (two) times daily.   cetirizine (ZYRTEC) 10 MG tablet Take 1 tablet (10 mg total) by mouth daily. FOR ASTHMA (Patient taking differently: Take 10 mg by mouth daily.)   Cholecalciferol (VITAMIN D3) 50 MCG (2000 UT) capsule Take 1 capsule (2,000 Units total) by mouth daily. Must keep scheduled follow=-up appt for future refills   docusate sodium (COLACE) 100 MG capsule Take 1 capsule (100 mg total) by mouth 2 (two) times daily as needed (for constipation).   famotidine (PEPCID) 20 MG tablet One after supper   gabapentin (NEURONTIN) 300 MG capsule Take 300 mg by mouth 3 (three) times daily.   hydrOXYzine (ATARAX/VISTARIL) 10 MG tablet Take 1 tablet (10 mg total) by mouth 2 (two) times daily as needed.   levothyroxine (SYNTHROID) 75 MCG tablet TAKE 1 TABLET(75 MCG) BY MOUTH DAILY   losartan (COZAAR) 50 MG tablet Take 1 tablet (50 mg total) by mouth daily.   Menthol-Methyl Salicylate (SALONPAS PAIN RELIEF PATCH EX) Place 1 patch onto the skin daily as needed (pain).   metFORMIN (GLUCOPHAGE) 500 MG tablet Take 1 tablet (500 mg total) by mouth daily.   montelukast (SINGULAIR) 10 MG tablet TAKE 1 TABLET(10 MG) BY MOUTH AT BEDTIME   montelukast (SINGULAIR) 10 MG tablet TAKE 1 TABLET(10 MG) BY MOUTH AT BEDTIME   nitrofurantoin, macrocrystal-monohydrate, (MACROBID) 100 MG capsule Take 1 capsule (100 mg total) by mouth 2 (two)  times daily.   NON FORMULARY DIET- REGULAR CCD   oxybutynin (DITROPAN) 5 MG tablet Take 1 tablet (5 mg total) by mouth 2 (two) times daily. (Patient taking differently: Take 10 mg by mouth daily.)   pantoprazole (PROTONIX) 40 MG tablet Take 1 tablet (40 mg total) by mouth daily. Take 30-60 min before first meal of the day   polyvinyl alcohol (LIQUIFILM TEARS)  1.4 % ophthalmic solution Place 1 drop into both eyes as needed for dry eyes.   simvastatin (ZOCOR) 40 MG tablet Take 1 tablet (40 mg total) by mouth daily at 6 PM.   tiZANidine (ZANAFLEX) 4 MG tablet Take 0.5-1 tablets (2-4 mg total) by mouth every 8 (eight) hours as needed for muscle spasms.   triamcinolone cream (KENALOG) 0.1 % Apply 1 application topically 2 (two) times daily.   No facility-administered encounter medications on file as of 01/28/2021.    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   Procedure: TOTAL KNEE ARTHROPLASTY;  Surgeon: Jodi Geralds, MD;  Location: [facility];  Service: Orthopedics;  Laterality: Right;   Family History  Problem Relation Age of Onset   Microcephaly Maternal Grandmother    Heart disease Maternal Grandmother        Not clear details, patient just knows she took "heart pills"   Asthma Daughter    Social History   Socioeconomic History   Marital status: Single    Spouse name: Not on file   Number of children: 1   Years of education: 12   Highest education level: Not on file  Occupational History   Not on file  Tobacco Use   Smoking status: Former    Packs/day: 0.50    Years: 20.00    Pack years: 10.00    Types: Cigarettes    Quit date: 01/06/1997    Years since quitting: 24.0   Smokeless tobacco: Never  Vaping Use   Vaping Use: Never used   Substance and Sexual Activity   Alcohol use: No   Drug use: No   Sexual activity: Not on file  Other Topics Concern   Not on file  Social History Narrative   Not on file   Social Determinants of Health   Financial Resource Strain: Low Risk    Difficulty of Paying Living Expenses: Not hard at all  Food Insecurity: No Food Insecurity   Worried About Programme researcher, broadcasting/film/video in the Last Year: Never true   Ran Out of Food in the Last Year: Never true  Transportation Needs: No Transportation Needs   Lack of Transportation (Medical): No   Lack of Transportation (Non-Medical): No  Physical Activity: Inactive   Days of Exercise per Week: 0 days   Minutes of Exercise per Session: 0 min  Stress: No  Stress Concern Present   Feeling of Stress : Only a little  Social Connections: Moderately Isolated   Frequency of Communication with Friends and Family: Twice a week   Frequency of Social Gatherings with Friends and Family: Twice a week   Attends Religious Services: More than 4 times per year   Active Member of [employer] or Organizations: No   Attends Engineer, structural: Never   Marital Status: Never married    Tobacco Counseling Counseling given: Not Answered   Clinical Intake:  Pre-visit preparation completed: Yes  Pain : No/denies pain     Nutritional Risks: None Diabetes: No  How often do you need to have someone help you when you read instructions, pamphlets, or other written materials from your doctor or pharmacy?: 1 - Never What is the last grade level you completed in school?: 12th  Diabetic?yes  Nutrition Risk Assessment:  Has the patient had any N/V/D within the last 2 months?  No  Does the patient have any non-healing wounds?  No  Has the patient had any unintentional weight loss or weight gain?  No   Diabetes:  Is the patient diabetic?  Yes  If diabetic, was a CBG obtained today?  No  Did the patient bring in their glucometer from home?  No  How often  do you monitor your CBG's? Never .   Financial Strains and Diabetes Management:  Are you having any financial strains with the device, your supplies or your medication? No .  Does the patient want to be seen by Chronic Care Management for management of their diabetes?  No  Would the patient like to be referred to a Nutritionist or for Diabetic Management?  No   Diabetic Exams:  Diabetic Eye Exam: Completed 11/2020 Diabetic Foot Exam: Overdue, Pt has been advised about the importance in completing this exam. Pt is scheduled for diabetic foot exam on next office visit .   Interpreter Needed?: No  Information entered by :: L.Tiye Huwe,LPN   Activities of Daily Living In your present state of health, do you have any difficulty performing the following activities: 01/28/2021 10/24/2020  Hearing? N N  Vision? N N  Difficulty concentrating or making decisions? N N  Walking or climbing stairs? N N  Dressing or bathing? N N  Doing errands, shopping? N N  Preparing Food and eating ? N -  Using the Toilet? N -  In the past six months, have you accidently leaked urine? Y -  Do you have problems with loss of bowel control? N -  Managing your Medications? N -  Managing your Finances? N -  Housekeeping or managing your Housekeeping? N -  Some recent data might be hidden    Patient Care Team: Myrlene Broker, MD as PCP - General (Internal Medicine)  Indicate any recent Medical Services you may have received from other than [facility] providers in the past year (date may be approximate).     Assessment:   This is a routine wellness examination for Lindsay [name].  Hearing/Vision screen Vision Screening - Comments:: Annual eye exams wear glasses   Dietary issues and exercise activities discussed: Current Exercise Habits: The patient does not participate in regular exercise at present, Exercise limited by: orthopedic condition(s)   Goals Addressed   None    Depression Screen PHQ 2/9 Scores  01/28/2021 01/28/2021 05/31/2020 09/02/2019 09/02/2019 05/02/2019 02/11/2018  PHQ - 2 Score 0 0 3 0 0 0 0  PHQ- 9 Score - - 9 - - - -  Fall Risk Fall Risk  01/28/2021 10/24/2020 09/02/2019 02/11/2018  Falls in the past year? 0 0 0 0  Number falls in past yr: - 0 0 0  Injury with Fall? 0 0 0 0  Risk for fall due to : (No Data) - Orthopedic patient;Impaired balance/gait -  Risk for fall due to: Comment uses cane - - -  Follow up Falls evaluation completed - Falls evaluation completed -    FALL RISK PREVENTION PERTAINING TO THE HOME:  Any stairs in or around the home? No  If so, are there any without handrails? No  Home free of loose throw rugs in walkways, pet beds, electrical cords, etc? No  Adequate lighting in your home to reduce risk of falls? Yes   ASSISTIVE DEVICES UTILIZED TO PREVENT FALLS:  Life alert? No  Use of a cane, walker or w/c? No  Grab bars in the bathroom? No  Shower chair or bench in shower? No  Elevated toilet seat or a handicapped toilet? No    Cognitive Function:Normal cognitive status assessed by direct observation by this Nurse Health Advisor. No abnormalities found.          Immunizations Immunization History  Administered Date(s) Administered   DTaP 02/10/2015   Fluad Quad(high Dose 65+) 09/09/2018, 10/18/2020   Influenza, High Dose Seasonal PF 03/11/2018   Moderna SARS-COV2 Booster Vaccination 04/27/2020   Moderna Sars-Covid-2 Vaccination 02/18/2019, 03/18/2019, 11/25/2019   Pneumococcal Conjugate-13 10/12/2014   Pneumococcal Polysaccharide-23 11/04/2016   Zoster Recombinat (Shingrix) 08/21/2016, 11/03/2016    TDAP status: Up to date  Flu Vaccine status: Up to date  Pneumococcal vaccine status: Up to date  Covid-19 vaccine status: Completed vaccines  Qualifies for Shingles Vaccine? Yes   Zostavax completed Yes   Shingrix Completed?: Yes  Screening Tests Health Maintenance  Topic Date Due   TETANUS/TDAP  Never done   OPHTHALMOLOGY EXAM   06/07/2020   COVID-19 Vaccine (4 - Booster for Moderna series) 06/22/2020   HEMOGLOBIN A1C  12/01/2020   FOOT EXAM  05/31/2021   Fecal DNA (Cologuard)  09/24/2021   MAMMOGRAM  11/23/2021   Pneumonia Vaccine 3465+ Years old  Completed   INFLUENZA VACCINE  Completed   DEXA SCAN  Completed   Hepatitis C Screening  Completed   Zoster Vaccines- Shingrix  Completed   HPV VACCINES  Aged Out    Health Maintenance  Health Maintenance Due  Topic Date Due   TETANUS/TDAP  Never done   OPHTHALMOLOGY EXAM  06/07/2020   COVID-19 Vaccine (4 - Booster for Moderna series) 06/22/2020   HEMOGLOBIN A1C  12/01/2020    Colorectal cancer screening: Type of screening: Cologuard. Completed 09/25/2018. Repeat every 3 years  Mammogram status: Completed 11/23/2020. Repeat every year  Bone Density status: Ordered 01/28/2021. Pt provided with contact info and advised to call to schedule appt.  Lung Cancer Screening: (Low Dose CT Chest recommended if Age 34-80 years, 30 pack-year currently smoking OR have quit w/in 15years.) does not qualify.   Lung Cancer Screening Referral: n/a  Additional Screening:  Hepatitis C Screening: does not qualify; Completed 02/11/2018  Vision Screening: Recommended annual ophthalmology exams for early detection of glaucoma and other disorders of the eye. Is the patient up to date with their annual eye exam?  Yes  Who is the provider or what is the name of the office in which the patient attends annual eye exams? Dr.Groat  If pt is not established with a provider, would they like to be referred  to a provider to establish care? No .   Dental Screening: Recommended annual dental exams for proper oral hygiene  Community Resource Referral / Chronic Care Management: CRR required this visit?  No   CCM required this visit?  No      Plan:     I have personally reviewed and noted the following in the patients chart:   Medical and social history Use of alcohol, tobacco or  illicit drugs  Current medications and supplements including opioid prescriptions.  Functional ability and status Nutritional status Physical activity Advanced directives List of other physicians Hospitalizations, surgeries, and ER visits in previous 12 months Vitals Screenings to include cognitive, depression, and falls Referrals and appointments  In addition, I have reviewed and discussed with patient certain preventive protocols, quality metrics, and best practice recommendations. A written personalized care plan for preventive services as well as general preventive health recommendations were provided to patient.     Lindsay Rummage, LPN   [id number]   Nurse Notes: none

## 2021-01-28 NOTE — Patient Instructions (Signed)
[name] , Thank you for taking time to come for your Medicare Wellness Visit. I appreciate your ongoing commitment to your health goals. Please review the following plan we discussed and let me know if I can assist you in the future.   Screening recommendations/referrals: Colonoscopy: Cologuard 09/25/2018 Mammogram: 11/23/2020 Bone Density: referral 01/29/2020 Recommended yearly ophthalmology/optometry visit for glaucoma screening and checkup Recommended yearly dental visit for hygiene and checkup  Vaccinations: Influenza vaccine: completed  Pneumococcal vaccine: completed  Tdap vaccine: 02/10/2015 Shingles vaccine: completed     Advanced directives: none   Conditions/risks identified: none   Next appointment: none    Preventive Care 53 Years and Older, Female Preventive care refers to lifestyle choices and visits with your health care provider that can promote health and wellness. What does preventive care include? A yearly physical exam. This is also called an annual well check. Dental exams once or twice a year. Routine eye exams. Ask your health care provider how often you should have your eyes checked. Personal lifestyle choices, including: Daily care of your teeth and gums. Regular physical activity. Eating a healthy diet. Avoiding tobacco and drug use. Limiting alcohol use. Practicing safe sex. Taking low-dose aspirin every day. Taking vitamin and mineral supplements as recommended by your health care provider. What happens during an annual well check? The services and screenings done by your health care provider during your annual well check will depend on your age, overall health, lifestyle risk factors, and family history of disease. Counseling  Your health care provider may ask you questions about your: Alcohol use. Tobacco use. Drug use. Emotional well-being. Home and relationship well-being. Sexual activity. Eating habits. History of falls. Memory and  ability to understand (cognition). Work and work Statistician. Reproductive health. Screening  You may have the following tests or measurements: Height, weight, and BMI. Blood pressure. Lipid and cholesterol levels. These may be checked every 5 years, or more frequently if you are over 17 years old. Skin check. Lung cancer screening. You may have this screening every year starting at age 43 if you have a 30-pack-year history of smoking and currently smoke or have quit within the past 15 years. Fecal occult blood test (FOBT) of the stool. You may have this test every year starting at age 72. Flexible sigmoidoscopy or colonoscopy. You may have a sigmoidoscopy every 5 years or a colonoscopy every 10 years starting at age 56. Hepatitis C blood test. Hepatitis B blood test. Sexually transmitted disease (STD) testing. Diabetes screening. This is done by checking your blood sugar (glucose) after you have not eaten for a while (fasting). You may have this done every 1-3 years. Bone density scan. This is done to screen for osteoporosis. You may have this done starting at age 28. Mammogram. This may be done every 1-2 years. Talk to your health care provider about how often you should have regular mammograms. Talk with your health care provider about your test results, treatment options, and if necessary, the need for more tests. Vaccines  Your health care provider may recommend certain vaccines, such as: Influenza vaccine. This is recommended every year. Tetanus, diphtheria, and acellular pertussis (Tdap, Td) vaccine. You may need a Td booster every 10 years. Zoster vaccine. You may need this after age 23. Pneumococcal 13-valent conjugate (PCV13) vaccine. One dose is recommended after age 62. Pneumococcal polysaccharide (PPSV23) vaccine. One dose is recommended after age 3. Talk to your health care provider about which screenings and vaccines you need and how  often you need them. This information is  not intended to replace advice given to you by your health care provider. Make sure you discuss any questions you have with your health care provider. Document Released: 01/19/2015 Document Revised: 09/12/2015 Document Reviewed: 10/24/2014 Elsevier Interactive Patient Education  2017 [place] Prevention in the Home Falls can cause injuries. They can happen to people of all ages. There are many things you can do to make your home safe and to help prevent falls. What can I do on the outside of my home? Regularly fix the edges of walkways and driveways and fix any cracks. Remove anything that might make you trip as you walk through a door, such as a raised step or threshold. Trim any bushes or trees on the path to your home. Use bright outdoor lighting. Clear any walking paths of anything that might make someone trip, such as rocks or tools. Regularly check to see if handrails are loose or broken. Make sure that both sides of any steps have handrails. Any raised decks and porches should have guardrails on the edges. Have any leaves, snow, or ice cleared regularly. Use sand or salt on walking paths during winter. Clean up any spills in your garage right away. This includes oil or grease spills. What can I do in the bathroom? Use night lights. Install grab bars by the toilet and in the tub and shower. Do not use towel bars as grab bars. Use non-skid mats or decals in the tub or shower. If you need to sit down in the shower, use a plastic, non-slip stool. Keep the floor dry. Clean up any water that spills on the floor as soon as it happens. Remove soap buildup in the tub or shower regularly. Attach bath mats securely with double-sided non-slip rug tape. Do not have throw rugs and other things on the floor that can make you trip. What can I do in the bedroom? Use night lights. Make sure that you have a light by your bed that is easy to reach. Do not use any sheets or blankets that  are too big for your bed. They should not hang down onto the floor. Have a firm chair that has side arms. You can use this for support while you get dressed. Do not have throw rugs and other things on the floor that can make you trip. What can I do in the kitchen? Clean up any spills right away. Avoid walking on wet floors. Keep items that you use a lot in easy-to-reach places. If you need to reach something above you, use a strong step stool that has a grab bar. Keep electrical cords out of the way. Do not use floor polish or wax that makes floors slippery. If you must use wax, use non-skid floor wax. Do not have throw rugs and other things on the floor that can make you trip. What can I do with my stairs? Do not leave any items on the stairs. Make sure that there are handrails on both sides of the stairs and use them. Fix handrails that are broken or loose. Make sure that handrails are as long as the stairways. Check any carpeting to make sure that it is firmly attached to the stairs. Fix any carpet that is loose or worn. Avoid having throw rugs at the top or bottom of the stairs. If you do have throw rugs, attach them to the floor with carpet tape. Make sure that you have a  light switch at the top of the stairs and the bottom of the stairs. If you do not have them, ask someone to add them for you. What else can I do to help prevent falls? Wear shoes that: Do not have high heels. Have rubber bottoms. Are comfortable and fit you well. Are closed at the toe. Do not wear sandals. If you use a stepladder: Make sure that it is fully opened. Do not climb a closed stepladder. Make sure that both sides of the stepladder are locked into place. Ask someone to hold it for you, if possible. Clearly mark and make sure that you can see: Any grab bars or handrails. First and last steps. Where the edge of each step is. Use tools that help you move around (mobility aids) if they are needed. These  include: Canes. Walkers. Scooters. Crutches. Turn on the lights when you go into a dark area. Replace any light bulbs as soon as they burn out. Set up your furniture so you have a clear path. Avoid moving your furniture around. If any of your floors are uneven, fix them. If there are any pets around you, be aware of where they are. Review your medicines with your doctor. Some medicines can make you feel dizzy. This can increase your chance of falling. Ask your doctor what other things that you can do to help prevent falls. This information is not intended to replace advice given to you by your health care provider. Make sure you discuss any questions you have with your health care provider. Document Released: 10/19/2008 Document Revised: 05/31/2015 Document Reviewed: 01/27/2014 Elsevier Interactive Patient Education  2017 [employer].

## 2021-01-30 ENCOUNTER — Ambulatory Visit (INDEPENDENT_AMBULATORY_CARE_PROVIDER_SITE_OTHER): Payer: Medicare Other | Admitting: Internal Medicine

## 2021-01-30 ENCOUNTER — Other Ambulatory Visit: Payer: Self-pay

## 2021-01-30 ENCOUNTER — Encounter: Payer: Self-pay | Admitting: Internal Medicine

## 2021-01-30 VITALS — BP 128/76 | HR 72 | Temp 98.1°F | Ht 63.0 in | Wt 201.0 lb

## 2021-01-30 DIAGNOSIS — E118 Type 2 diabetes mellitus with unspecified complications: Secondary | ICD-10-CM | POA: Diagnosis not present

## 2021-01-30 DIAGNOSIS — E1169 Type 2 diabetes mellitus with other specified complication: Secondary | ICD-10-CM | POA: Diagnosis not present

## 2021-01-30 DIAGNOSIS — M1711 Unilateral primary osteoarthritis, right knee: Secondary | ICD-10-CM | POA: Diagnosis not present

## 2021-01-30 DIAGNOSIS — E785 Hyperlipidemia, unspecified: Secondary | ICD-10-CM

## 2021-01-30 DIAGNOSIS — N390 Urinary tract infection, site not specified: Secondary | ICD-10-CM

## 2021-01-30 LAB — URINALYSIS, ROUTINE W REFLEX MICROSCOPIC
Bilirubin Urine: NEGATIVE
Hgb urine dipstick: NEGATIVE
Ketones, ur: NEGATIVE
Nitrite: NEGATIVE
RBC / HPF: NONE SEEN (ref 0–?)
Specific Gravity, Urine: 1.02 (ref 1.000–1.030)
Total Protein, Urine: NEGATIVE
Urine Glucose: NEGATIVE
Urobilinogen, UA: 0.2 (ref 0.0–1.0)
pH: 5.5 (ref 5.0–8.0)

## 2021-01-30 LAB — COMPREHENSIVE METABOLIC PANEL
ALT: 10 U/L (ref 0–35)
AST: 13 U/L (ref 0–37)
Albumin: 4.1 g/dL (ref 3.5–5.2)
Alkaline Phosphatase: 55 U/L (ref 39–117)
BUN: 27 mg/dL — ABNORMAL HIGH (ref 6–23)
CO2: 29 mEq/L (ref 19–32)
Calcium: 9.8 mg/dL (ref 8.4–10.5)
Chloride: 109 mEq/L (ref 96–112)
Creatinine, Ser: 0.84 mg/dL (ref 0.40–1.20)
GFR: 68.85 mL/min (ref >60.00)
Glucose, Bld: 99 mg/dL (ref 70–99)
Potassium: 3.9 mEq/L (ref 3.5–5.1)
Sodium: 145 mEq/L (ref 135–145)
Total Bilirubin: 0.3 mg/dL (ref 0.2–1.2)
Total Protein: 7.3 g/dL (ref 6.0–8.3)

## 2021-01-30 LAB — LIPID PANEL
Cholesterol: 150 mg/dL (ref 0–200)
HDL: 42.3 mg/dL (ref >39.00)
LDL Cholesterol: 93 mg/dL (ref 0–99)
NonHDL: 108.03
Total CHOL/HDL Ratio: 4
Triglycerides: 76 mg/dL (ref 0.0–149.0)
VLDL: 15.2 mg/dL (ref 0.0–40.0)

## 2021-01-30 LAB — CBC
HCT: 34.8 % — ABNORMAL LOW (ref 36.0–46.0)
Hemoglobin: 11 g/dL — ABNORMAL LOW (ref 12.0–15.0)
MCHC: 31.8 g/dL (ref 30.0–36.0)
MCV: 82.9 fl (ref 78.0–100.0)
Platelets: 296 10*3/uL (ref 150.0–400.0)
RBC: 4.19 Mil/uL (ref 3.87–5.11)
RDW: 14.3 % (ref 11.5–15.5)
WBC: 10.2 10*3/uL (ref 4.0–10.5)

## 2021-01-30 LAB — HEMOGLOBIN A1C: Hgb A1c MFr Bld: 6.2 % (ref 4.6–6.5)

## 2021-01-30 LAB — MICROALBUMIN / CREATININE URINE RATIO
Creatinine,U: 114 mg/dL
Microalb Creat Ratio: 3.1 mg/g (ref 0.0–30.0)
Microalb, Ur: 3.5 mg/dL — ABNORMAL HIGH (ref 0.0–1.9)

## 2021-01-30 MED ORDER — NITROFURANTOIN MONOHYD MACRO 100 MG PO CAPS: 100.0000 mg | ORAL_CAPSULE | Freq: Two times a day (BID) | ORAL | 3 refills | Status: DC

## 2021-01-30 MED ORDER — MELOXICAM 7.5 MG PO TABS: 7.5000 mg | ORAL_TABLET | Freq: Every day | ORAL | 5 refills | Status: DC

## 2021-01-30 NOTE — Progress Notes (Signed)
° °  Subjective:   Patient ID: Athena Masse, female    DOB: 1945/07/02, 76 y.o.   MRN: [id number]  HPI The patient is a 76 YO female coming in for follow up.   Review of Systems  Constitutional: Negative.   HENT: Negative.    Eyes: Negative.   Respiratory:  Negative for cough, chest tightness and shortness of breath.   Cardiovascular:  Negative for chest pain, palpitations and leg swelling.  Gastrointestinal:  Negative for abdominal distention, abdominal pain, constipation, diarrhea, nausea and vomiting.  Musculoskeletal:  Positive for arthralgias.  Skin: Negative.   Neurological: Negative.   Psychiatric/Behavioral: Negative.     Objective:  Physical Exam Constitutional:      Appearance: She is well-developed.  HENT:     Head: Normocephalic and atraumatic.  Cardiovascular:     Rate and Rhythm: Normal rate and regular rhythm.  Pulmonary:     Effort: Pulmonary effort is normal. No respiratory distress.     Breath sounds: Normal breath sounds. No wheezing or rales.  Abdominal:     General: Bowel sounds are normal. There is no distension.     Palpations: Abdomen is soft.     Tenderness: There is no abdominal tenderness. There is no rebound.  Musculoskeletal:        General: Tenderness present.     Cervical back: Normal range of motion.  Skin:    General: Skin is warm and dry.  Neurological:     Mental Status: She is alert and oriented to person, place, and time.     Coordination: Coordination normal.    Vitals:   01/30/21 1127  BP: 128/76  Pulse: 72  Temp: 98.1 F (36.7 C)  TempSrc: Oral  SpO2: 96%  Weight: 201 lb (91.2 kg)  Height: 5\' 3"  (1.6 m)    This visit occurred during the SARS-CoV-2 public health emergency.  Safety protocols were in place, including screening questions prior to the visit, additional usage of staff PPE, and extensive cleaning of exam room while observing appropriate contact time as indicated for disinfecting solutions.   Assessment &  Plan:

## 2021-01-30 NOTE — Patient Instructions (Addendum)
We will check the labs today and the urine. If the HgA1c is <6 we will stop metformin.  We have sent in meloxicam for the arthritis pain. You can take 1 pill daily of this.

## 2021-01-31 ENCOUNTER — Encounter: Payer: Self-pay | Admitting: Internal Medicine

## 2021-01-31 ENCOUNTER — Other Ambulatory Visit: Payer: Self-pay | Admitting: Internal Medicine

## 2021-01-31 NOTE — Assessment & Plan Note (Signed)
Rx meloxicam as normal renal function. Checking CBC and CMP today.

## 2021-01-31 NOTE — Assessment & Plan Note (Signed)
Taking metformin 500 mg daily and this is causing some diarrhea. She has heard from family/friends that this is a bad medicine she should stop. Last Hga1c 5.9 so will check HgA1c today and if <6.2 will stop metformin and follow up in 3 months. She is on ARB and statin. Checking microalbumin to creatinine ratio. Referral for eye exam as this is not current.

## 2021-01-31 NOTE — Assessment & Plan Note (Signed)
Checking lipid panel as due. Adjust simvastatin 40 mg daily as needed for goal LDL <100.

## 2021-01-31 NOTE — Assessment & Plan Note (Signed)
Checking U/A today and rx macrobid to have on file with pharmacy for early treatment to avoid sepsis.

## 2021-02-21 DIAGNOSIS — M1712 Unilateral primary osteoarthritis, left knee: Secondary | ICD-10-CM | POA: Diagnosis not present

## 2021-02-21 DIAGNOSIS — M25562 Pain in left knee: Secondary | ICD-10-CM | POA: Diagnosis not present

## 2021-03-06 ENCOUNTER — Other Ambulatory Visit: Payer: Self-pay

## 2021-03-06 ENCOUNTER — Ambulatory Visit (INDEPENDENT_AMBULATORY_CARE_PROVIDER_SITE_OTHER): Payer: Medicare Other | Admitting: Podiatry

## 2021-03-06 DIAGNOSIS — M19071 Primary osteoarthritis, right ankle and foot: Secondary | ICD-10-CM

## 2021-03-06 DIAGNOSIS — M19079 Primary osteoarthritis, unspecified ankle and foot: Secondary | ICD-10-CM

## 2021-03-08 ENCOUNTER — Encounter: Payer: Self-pay | Admitting: Internal Medicine

## 2021-03-08 ENCOUNTER — Ambulatory Visit (INDEPENDENT_AMBULATORY_CARE_PROVIDER_SITE_OTHER): Payer: Medicare Other | Admitting: Internal Medicine

## 2021-03-08 ENCOUNTER — Other Ambulatory Visit: Payer: Self-pay

## 2021-03-08 VITALS — BP 120/74 | HR 72 | Ht 63.0 in | Wt 209.0 lb

## 2021-03-08 DIAGNOSIS — E039 Hypothyroidism, unspecified: Secondary | ICD-10-CM | POA: Diagnosis not present

## 2021-03-08 LAB — TSH: TSH: 3.39 u[IU]/mL (ref 0.35–5.50)

## 2021-03-08 LAB — T4, FREE: Free T4: 0.91 ng/dL (ref 0.60–1.60)

## 2021-03-08 NOTE — Progress Notes (Signed)
? ?Name: Lindsay [name]  ?MRN/ DOB: [id number], September 27, 1945    ?Age/ Sex: 76 y.o., female   ? ? ?PCP: Myrlene Broker, MD   ?Reason for Endocrinology Evaluation: Hypothyroidism  ?   ?Initial Endocrinology Clinic Visit: 01/11/2019  ? ? ?PATIENT IDENTIFIER: Lindsay [name] is a 76 y.o., female with a past medical history of HTN, T2DM and Hypothyroidism. She has followed with [facility] since 01/11/2019 for consultative assistance with management of her hypothyroidism.  ? ?HISTORICAL SUMMARY: The patient was diagnosed with hypothyroidism in 12/2018 with a TSH 25.71 uIU/mL . Was started on levothyroxine 50 mcg but developed diarrhea that she attributed to LT-4 replacement and stopped it.  ?She was encouraged to restart it on her initial visit to our clinic ? ?SUBJECTIVE:  ? ?Today (03/08/2021):  Lindsay [name] is here for a follow up on hypothyroidism.  ? ?Weight has been trending up  ?Has alternating constipation and diarrhea  ?Denies palpitations  ?No local neck symptoms  ?Continues with back issues  ?She had intra-articular injections to the left knee and right ankle  ?She has a right foot boot  ? ? ? ? ?Levothyroxine 75 mcg daily  ? ? ?HISTORY:  ?Past Medical History:  ?Past Medical History:  ?Diagnosis Date  ? Acid reflux   ? Anemia   ? Arthritis   ? Asthma   ? COPD (chronic obstructive pulmonary disease) (HCC)   ? Diabetes mellitus without complication (HCC)   ? TYPE 2   ? Hyperglycemia   ? Hypertension   ? Hypokalemia   ? Hypothyroidism   ? Obesity   ? PONV (postoperative nausea and vomiting)   ? Spinal stenosis   ? Urinary tract infection, chronic   ? ?Past Surgical History:  ?Past Surgical History:  ?Procedure Laterality Date  ? ABDOMINAL HYSTERECTOMY    ? ANKLE FUSION Left   ? CHOLECYSTECTOMY    ? 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 Procedure: TOTAL KNEE ARTHROPLASTY;  Surgeon: Jodi Geralds, MD;  Location: [facility];  Service: Orthopedics;  Laterality: Right;  ? ?Social History:  reports  that she quit smoking about 24 years ago. Her smoking use included cigarettes. She has a 10.00 pack-year smoking history. She has never used smokeless tobacco. She reports that she does not drink alcohol and does not use drugs. ?Family History:  ?Family History  ?Problem Relation Age of Onset  ? Microcephaly Maternal Grandmother   ? Heart disease Maternal Grandmother   ?     Not clear details, patient just knows she took "heart pills"  ? Asthma Daughter   ? ? ? ?HOME MEDICATIONS: ?Allergies as of 03/08/2021   ? ?   Reactions  ? Codeine Nausea Only  ? Lisinopril Cough  ? Morphine And Related Itching  ? Shellfish Allergy Itching  ? ?  ? ?  ?Medication List  ?  ? ?  ? Accurate as of March 08, 2021 10:54 AM. If you have any questions, ask your nurse or doctor.  ?  ?  ? ?  ? ?albuterol 108 (90 Base) MCG/ACT inhaler ?Commonly known as: VENTOLIN HFA ?Inhale 2 puffs into the lungs every 6 (six) hours as needed for wheezing or shortness of breath. ?  ?betamethasone valerate ointment 0.1 % ?Commonly known as: VALISONE ?Apply 1 application topically 2 (two) times daily. ?  ?budesonide-formoterol 80-4.5 MCG/ACT inhaler ?Commonly known as: Symbicort ?Inhale 2 puffs into the lungs 2 (two) times daily. ?  ?cetirizine 10  MG tablet ?Commonly known as: ZYRTEC ?Take 1 tablet (10 mg total) by mouth daily. FOR ASTHMA ?What changed: additional instructions ?  ?docusate sodium 100 MG capsule ?Commonly known as: Colace ?Take 1 capsule (100 mg total) by mouth 2 (two) times daily as needed (for constipation). ?  ?famotidine 20 MG tablet ?Commonly known as: Pepcid ?One after supper ?  ?Fluad Quadrivalent 0.5 ML injection ?Generic drug: influenza vaccine adjuvanted ?  ?gabapentin 300 MG capsule ?Commonly known as: NEURONTIN ?Take 300 mg by mouth 3 (three) times daily. ?  ?hydrOXYzine 10 MG tablet ?Commonly known as: ATARAX ?Take 1 tablet (10 mg total) by mouth 2 (two) times daily as needed. ?  ?levothyroxine 75 MCG tablet ?Commonly known as:  SYNTHROID ?TAKE 1 TABLET(75 MCG) BY MOUTH DAILY ?  ?losartan 50 MG tablet ?Commonly known as: COZAAR ?Take 1 tablet (50 mg total) by mouth daily. ?  ?meloxicam 7.5 MG tablet ?Commonly known as: MOBIC ?Take 1 tablet (7.5 mg total) by mouth daily. ?  ?metFORMIN 500 MG tablet ?Commonly known as: GLUCOPHAGE ?Take 1 tablet (500 mg total) by mouth daily. ?  ?montelukast 10 MG tablet ?Commonly known as: SINGULAIR ?TAKE 1 TABLET(10 MG) BY MOUTH AT BEDTIME ?What changed: Another medication with the same name was removed. Continue taking this medication, and follow the directions you see here. ?Changed by: Scarlette Shorts, MD ?  ?nitrofurantoin (macrocrystal-monohydrate) 100 MG capsule ?Commonly known as: Macrobid ?Take 1 capsule (100 mg total) by mouth 2 (two) times daily. ?  ?NON FORMULARY ?DIET- REGULAR CCD ?  ?oxybutynin 5 MG tablet ?Commonly known as: DITROPAN ?Take 1 tablet (5 mg total) by mouth 2 (two) times daily. ?What changed:  ?how much to take ?when to take this ?  ?pantoprazole 40 MG tablet ?Commonly known as: Protonix ?Take 1 tablet (40 mg total) by mouth daily. Take 30-60 min before first meal of the day ?  ?polyvinyl alcohol 1.4 % ophthalmic solution ?Commonly known as: LIQUIFILM TEARS ?Place 1 drop into both eyes as needed for dry eyes. ?  ?SALONPAS PAIN RELIEF PATCH EX ?Place 1 patch onto the skin daily as needed (pain). ?  ?simvastatin 40 MG tablet ?Commonly known as: ZOCOR ?Take 1 tablet (40 mg total) by mouth daily at 6 PM. ?  ?tiZANidine 4 MG tablet ?Commonly known as: ZANAFLEX ?Take 0.5-1 tablets (2-4 mg total) by mouth every 8 (eight) hours as needed for muscle spasms. ?  ?triamcinolone cream 0.1 % ?Commonly known as: KENALOG ?Apply 1 application topically 2 (two) times daily. ?  ?Vitamin D3 50 MCG (2000 UT) capsule ?Take 1 capsule (2,000 Units total) by mouth daily. Must keep scheduled follow=-up appt for future refills ?  ? ?  ? ? ? ? ?OBJECTIVE:  ? ?PHYSICAL EXAM: ?VS: BP 120/74 (BP Location:  Left Arm, Patient Position: Sitting, Cuff Size: Small)   Pulse 72   Ht 5\' 3"  (1.6 m)   Wt 209 lb (94.8 kg)   BMI 37.02 kg/m?   ? ?EXAM: ?General: Pt appears well and is in NAD  ?Neck: General: Supple without adenopathy. ?Thyroid: Thyroid size normal.  No goiter or nodules appreciated.   ?Lungs: Clear with good BS bilat with no rales, rhonchi, or wheezes  ?Heart: Auscultation: RRR.  ?Extremities:  ?BL LE: No pretibial edema normal ROM and strength.  ?Mental Status: Judgment, insight: Intact ?Mood and affect: No depression, anxiety, or agitation  ? ? ? ?DATA REVIEWED: ? ? Latest Reference Range & Units 03/08/21 10:57  ?  TSH 0.35 - 5.50 uIU/mL 3.39  ?T4,Free(Direct) 0.60 - 1.60 ng/dL 9.48  ? ? ? ?ASSESSMENT / PLAN / RECOMMENDATIONS:  ? ?Hypothyroidism: ? ? ?- Pt is clinically euthyroid  ?- No local neck symptoms ?- TSH is slightly elevated , no changes today , suspect imperfect adherence ?- Pt is questioning how long she will be taking LT-4 replacement. I explained to the pt that LT-4 replacement is lifelong. Pt not very pleased with this, but we discussed importance of compliance with levothyroxine  ?- Will recheck in 8 weeks and determine then if the dose needs to be adjusted.  ? ? ?Medications  ?Continue levothyroxine 75 mcg daily  ? ? ? ? ? ?F/u in 1 yr  ? ?Signed electronically by: ?Abby Raelyn Mora, MD ? ?[facility]  ?[facility] ?[address] ?[place], [place] [address] ?Phone: [phone] ?FAX: [phone]  ? ? ? ? ?CC: ?Myrlene Broker, MD ?[address] ?[place] [address] ?Phone: [phone]  ?Fax: [phone] ? ? ?Return to Endocrinology clinic as below: ?Future Appointments  ?Date Time Provider Department Center  ?05/01/2021 10:00 AM Myrlene Broker, MD [facility] None  ?07/02/2021 10:30 AM [facility] DX DEXA 1 [facility]  ?  ? ?

## 2021-03-08 NOTE — Patient Instructions (Signed)

## 2021-03-11 ENCOUNTER — Telehealth: Payer: Self-pay | Admitting: Internal Medicine

## 2021-03-11 MED ORDER — LEVOTHYROXINE SODIUM 75 MCG PO TABS: 75.0000 ug | ORAL_TABLET | Freq: Every day | ORAL | 3 refills | Status: DC

## 2021-03-11 NOTE — Telephone Encounter (Signed)
Patient notified and verbalized understanding. 

## 2021-03-11 NOTE — Telephone Encounter (Signed)
Please let the patient know that her thyroid test came back normal and to continue levothyroxine 75 mcg daily. ? ? ? ?I will refill request for you has been sent ?Thanks ?

## 2021-03-12 NOTE — Progress Notes (Signed)
?Subjective:  ?Patient ID: Lindsay [name], female    DOB: 31-Aug-1945,  MRN: [id number] ? ?Chief Complaint  ?Patient presents with  ? Injections  ? ? ?76 y.o. female presents with the above complaint.  Patient presents with right lateral midfoot pain.  She states the injection helped somewhat.  She states that her pain started coming off as injection wore off.  She has been wearing her brace.  She denies any other acute complaints.  She would like to know if she can get another shot. ? ? ?Review of Systems: Negative except as noted in the HPI. Denies N/V/F/Ch. ? ?Past Medical History:  ?Diagnosis Date  ? Acid reflux   ? Anemia   ? Arthritis   ? Asthma   ? COPD (chronic obstructive pulmonary disease) ([place])   ? Diabetes mellitus without complication ([facility] [place])   ? TYPE 2   ? Hyperglycemia   ? Hypertension   ? Hypokalemia   ? Hypothyroidism   ? Obesity   ? PONV (postoperative nausea and vomiting)   ? Spinal stenosis   ? Urinary tract infection, chronic   ? ? ?Current Outpatient Medications:  ?  albuterol (VENTOLIN HFA) 108 (90 Base) MCG/ACT inhaler, Inhale 2 puffs into the lungs every 6 (six) hours as needed for wheezing or shortness of breath., Disp: 8 g, Rfl: 5 ?  betamethasone valerate ointment (VALISONE) 0.1 %, Apply 1 application topically 2 (two) times daily., Disp: 100 g, Rfl: 0 ?  budesonide-formoterol (SYMBICORT) 80-4.5 MCG/ACT inhaler, Inhale 2 puffs into the lungs 2 (two) times daily., Disp: 6.9 g, Rfl: 5 ?  cetirizine (ZYRTEC) 10 MG tablet, Take 1 tablet (10 mg total) by mouth daily. FOR ASTHMA (Patient taking differently: Take 10 mg by mouth daily.), Disp: 30 tablet, Rfl: 5 ?  Cholecalciferol (VITAMIN D3) 50 MCG (2000 UT) capsule, Take 1 capsule (2,000 Units total) by mouth daily. Must keep scheduled follow=-up appt for future refills, Disp: 30 capsule, Rfl: 0 ?  docusate sodium (COLACE) 100 MG capsule, Take 1 capsule (100 mg total) by mouth 2 (two) times daily as needed (for constipation)., Disp: 180 capsule,  Rfl: 1 ?  famotidine (PEPCID) 20 MG tablet, One after supper, Disp: 30 tablet, Rfl: 11 ?  FLUAD QUADRIVALENT 0.5 ML injection, , Disp: , Rfl:  ?  gabapentin (NEURONTIN) 300 MG capsule, Take 300 mg by mouth 3 (three) times daily., Disp: , Rfl:  ?  hydrOXYzine (ATARAX/VISTARIL) 10 MG tablet, Take 1 tablet (10 mg total) by mouth 2 (two) times daily as needed., Disp: 30 tablet, Rfl: 0 ?  levothyroxine (SYNTHROID) 75 MCG tablet, Take 1 tablet (75 mcg total) by mouth daily before breakfast., Disp: 90 tablet, Rfl: 3 ?  losartan (COZAAR) 50 MG tablet, Take 1 tablet (50 mg total) by mouth daily., Disp: 90 tablet, Rfl: 3 ?  meloxicam (MOBIC) 7.5 MG tablet, Take 1 tablet (7.5 mg total) by mouth daily., Disp: 30 tablet, Rfl: 5 ?  Menthol-Methyl Salicylate (SALONPAS PAIN RELIEF PATCH EX), Place 1 patch onto the skin daily as needed (pain)., Disp: , Rfl:  ?  metFORMIN (GLUCOPHAGE) 500 MG tablet, Take 1 tablet (500 mg total) by mouth daily., Disp: 90 tablet, Rfl: 3 ?  montelukast (SINGULAIR) 10 MG tablet, TAKE 1 TABLET(10 MG) BY MOUTH AT BEDTIME, Disp: 30 tablet, Rfl: 5 ?  nitrofurantoin, macrocrystal-monohydrate, (MACROBID) 100 MG capsule, Take 1 capsule (100 mg total) by mouth 2 (two) times daily., Disp: 14 capsule, Rfl: 3 ?  NON FORMULARY, DIET-  REGULAR CCD, Disp: , Rfl:  ?  oxybutynin (DITROPAN) 5 MG tablet, Take 1 tablet (5 mg total) by mouth 2 (two) times daily. (Patient taking differently: Take 10 mg by mouth daily.), Disp: 60 tablet, Rfl: 0 ?  pantoprazole (PROTONIX) 40 MG tablet, Take 1 tablet (40 mg total) by mouth daily. Take 30-60 min before first meal of the day, Disp: 90 tablet, Rfl: 3 ?  polyvinyl alcohol (LIQUIFILM TEARS) 1.4 % ophthalmic solution, Place 1 drop into both eyes as needed for dry eyes., Disp: 15 mL, Rfl: 0 ?  simvastatin (ZOCOR) 40 MG tablet, Take 1 tablet (40 mg total) by mouth daily at 6 PM., Disp: 90 tablet, Rfl: 3 ?  tiZANidine (ZANAFLEX) 4 MG tablet, Take 0.5-1 tablets (2-4 mg total) by mouth  every 8 (eight) hours as needed for muscle spasms., Disp: 90 tablet, Rfl: 1 ?  triamcinolone cream (KENALOG) 0.1 %, Apply 1 application topically 2 (two) times daily., Disp: 100 g, Rfl: 0 ? ?Social History  ? ?Tobacco Use  ?Smoking Status Former  ? Packs/day: 0.50  ? Years: 20.00  ? Pack years: 10.00  ? Types: Cigarettes  ? Quit date: 01/06/1997  ? Years since quitting: 24.1  ?Smokeless Tobacco Never  ? ? ?Allergies  ?Allergen Reactions  ? Codeine Nausea Only  ? Lisinopril Cough  ? Morphine And Related Itching  ? Shellfish Allergy Itching  ? ?Objective:  ?There were no vitals filed for this visit. ?There is no height or weight on file to calculate BMI. ?Constitutional Well developed. ?Well nourished.  ?Vascular Dorsalis pedis pulses palpable bilaterally. ?Posterior tibial pulses palpable bilaterally. ?Capillary refill normal to all digits.  ?No cyanosis or clubbing noted. ?Pedal hair growth normal.  ?Neurologic Normal speech. ?Oriented to person, place, and time. ?Epicritic sensation to light touch grossly present bilaterally.  ?Dermatologic Nails well groomed and normal in appearance. ?No open wounds. ?No skin lesions.  ?Orthopedic: Pain on palpation of right lateral midfoot.  Clinically unable to appreciate its superficial exostoses leading to excessive pressure to the dorsal skin and therefore the pain.  Negative Tinel's sign.  Negative piano key test.  No calf pain.  ? ?Radiographs: 3 views of skeletally mature the right foot: Osteoarthritic changes noted to the midfoot.  Severe pes planovalgus foot structure noted with decreasing calcaneal inclination angle anterior break in the cyma line elevatus present. ?Assessment:  ? ?1. Arthritis of midfoot   ? ? ?Plan:  ?Patient was evaluated and treated and all questions answered. ? 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  I discussed shoe gear  modification as well as offloading to see if that is beneficial.  If he continues to have pain we will continue doing steroid injection versus versus tried some over-the-counter medication.   ?-A second steroid injection was performed at right lateral midfoot using 1% plain Lidocaine and 10 mg of Kenalog. This was well tolerated. ?-We will plan a cam boot immobilization versus MRI/CT scan if there is no improvement ?

## 2021-03-13 IMAGING — MG DIGITAL SCREENING BILAT W/ TOMO W/ CAD
8 series · 8 of 24 positions shown · non-contrast
Comparison: Previous exam(s).

CLINICAL DATA: Screening.

EXAM:
DIGITAL SCREENING BILATERAL MAMMOGRAM WITH TOMO AND CAD

[L CC synth-2D]
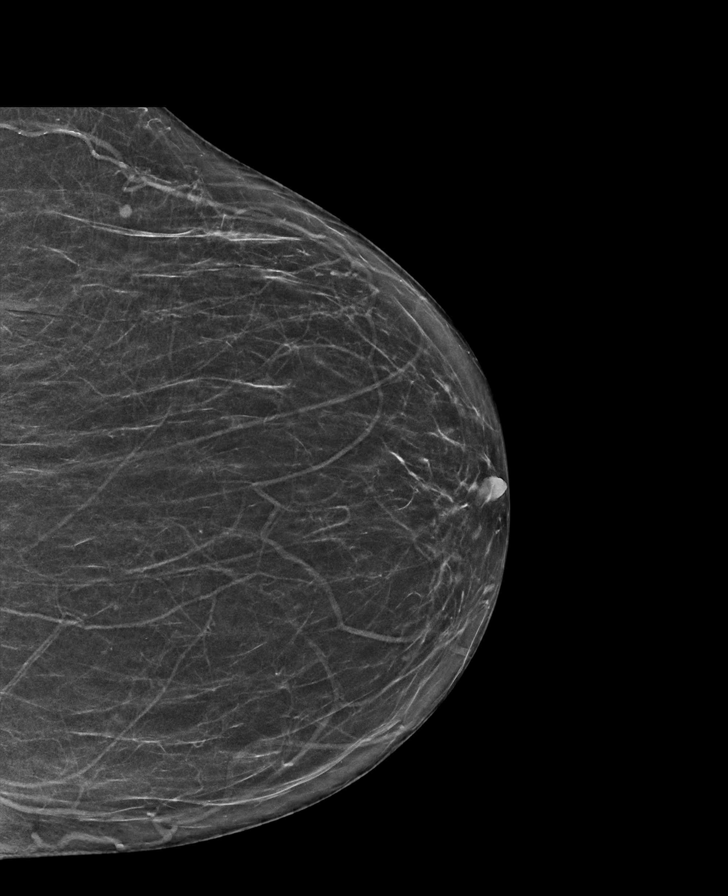

[R CC synth-2D]
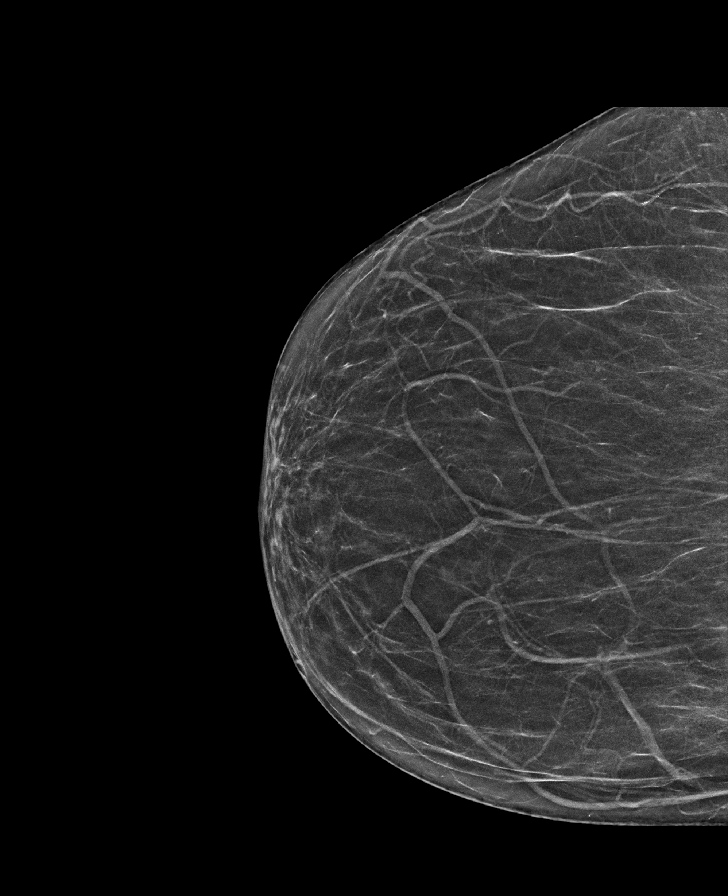

[R MLO synth-2D]
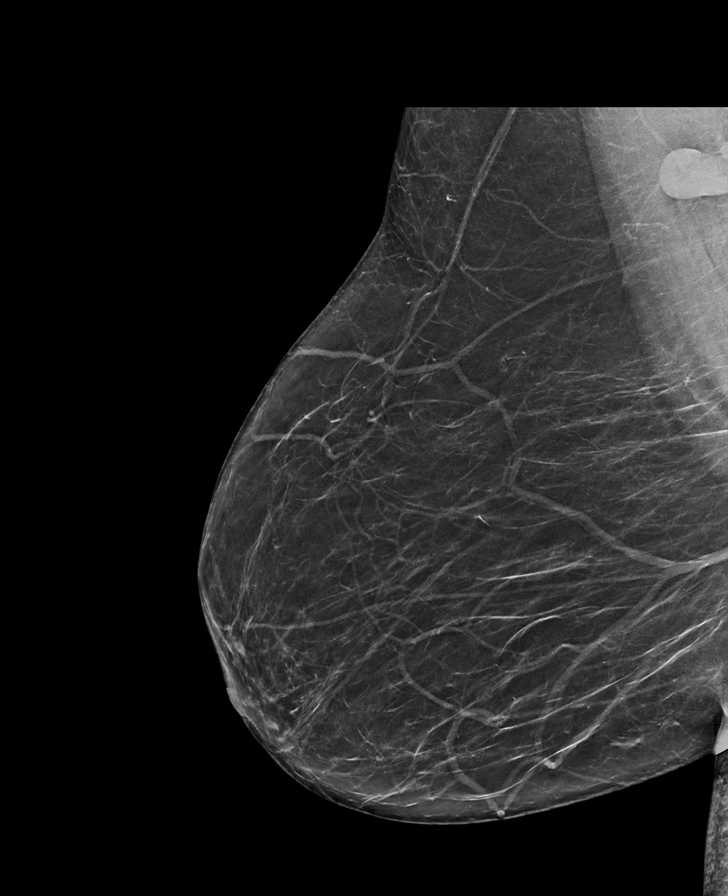

[L MLO synth-2D]
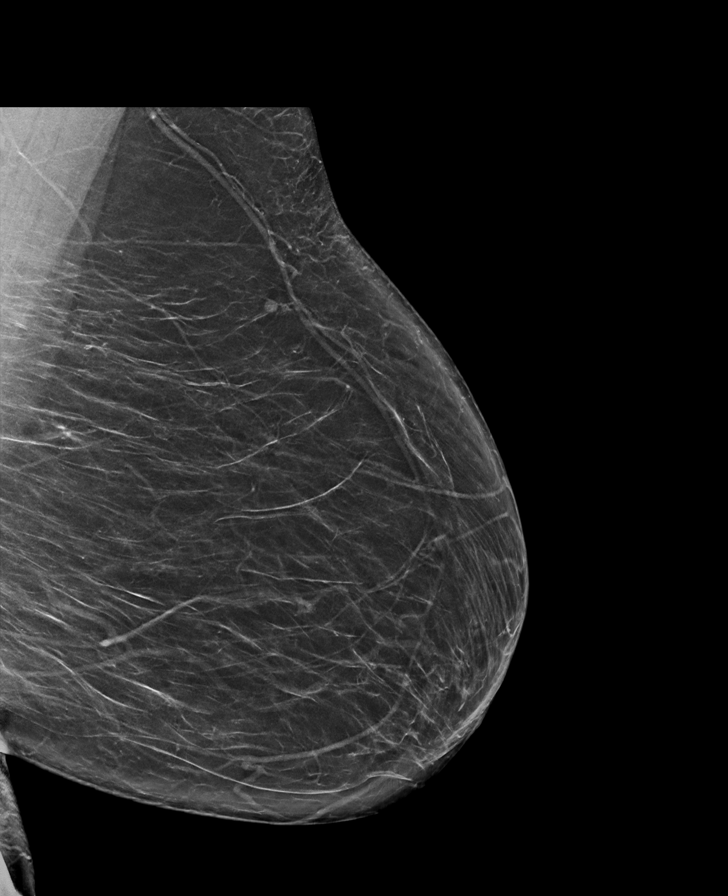

[L MLO tomo · tomo slice 39/76.0]
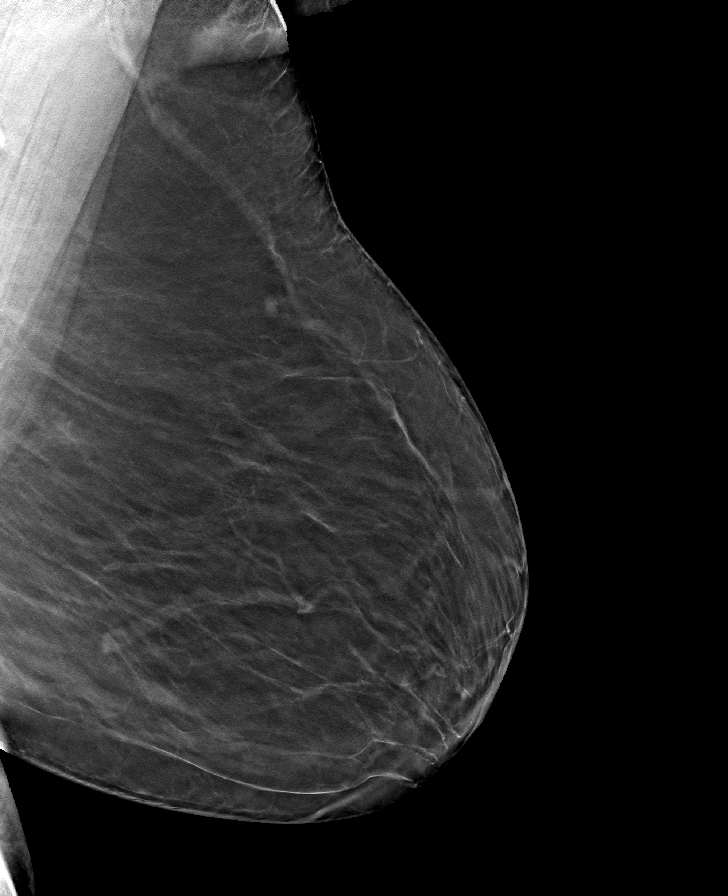

[L CC tomo · tomo slice 29/57.0]
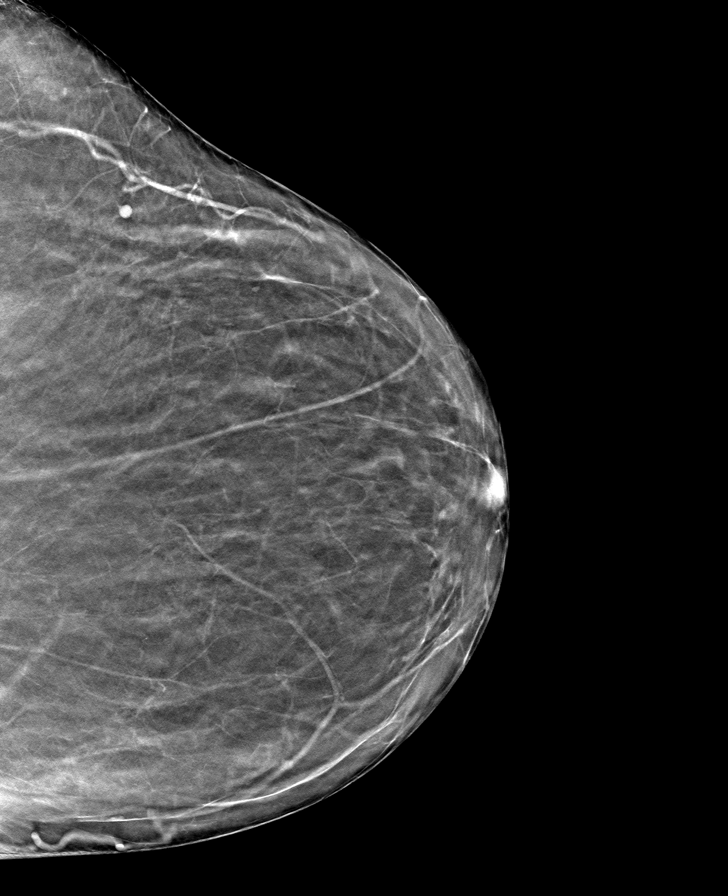

[R CC tomo · tomo slice 28/55.0]
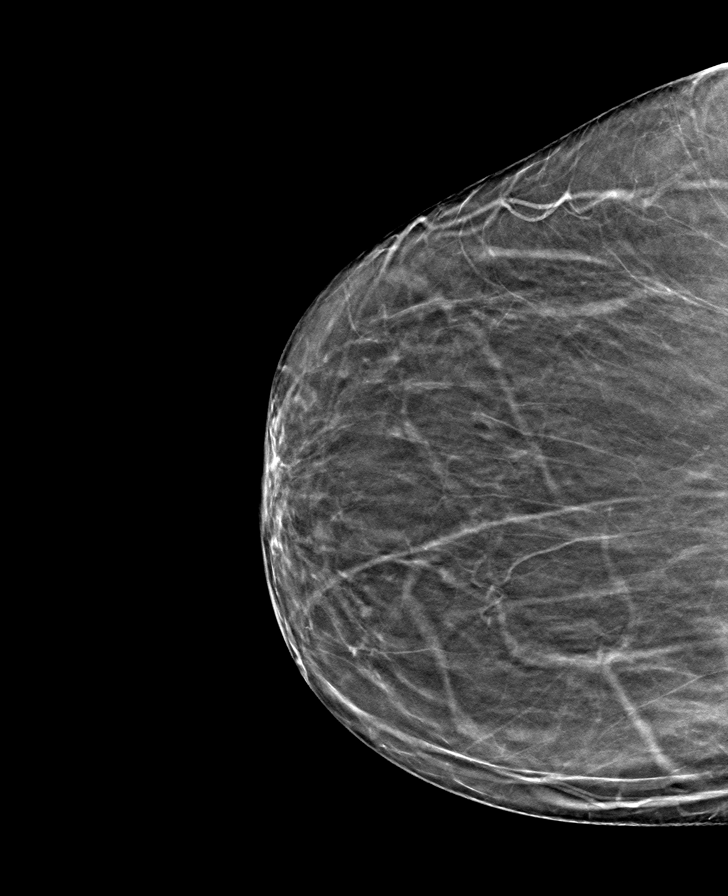

[R MLO tomo · tomo slice 37/73.0]
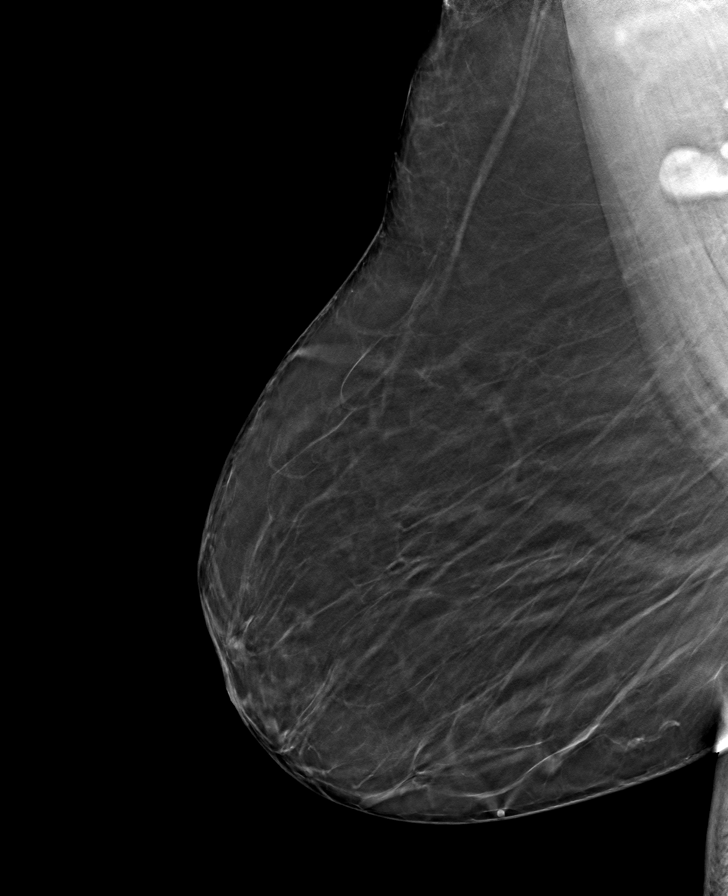

[8 of 24 positions shown; findings below may reference images not displayed]

ACR Breast Density Category b: There are scattered areas of
fibroglandular density.
FINDINGS: There are no findings suspicious for malignancy. Images were
processed with CAD.
IMPRESSION: No mammographic evidence of malignancy. A result letter of this
screening mammogram will be mailed directly to the patient.

RECOMMENDATION:
Screening mammogram in one year. (Code:[id number])

BI-RADS CATEGORY  1: Negative.

## 2021-04-04 ENCOUNTER — Other Ambulatory Visit: Payer: Self-pay | Admitting: Internal Medicine

## 2021-04-04 DIAGNOSIS — E1169 Type 2 diabetes mellitus with other specified complication: Secondary | ICD-10-CM

## 2021-04-04 DIAGNOSIS — E118 Type 2 diabetes mellitus with unspecified complications: Secondary | ICD-10-CM

## 2021-05-01 ENCOUNTER — Encounter: Payer: Self-pay | Admitting: Internal Medicine

## 2021-05-01 ENCOUNTER — Ambulatory Visit (INDEPENDENT_AMBULATORY_CARE_PROVIDER_SITE_OTHER): Payer: Medicare Other | Admitting: Internal Medicine

## 2021-05-01 VITALS — BP 130/70 | HR 72 | Resp 18 | Ht 63.0 in | Wt 212.4 lb

## 2021-05-01 DIAGNOSIS — E118 Type 2 diabetes mellitus with unspecified complications: Secondary | ICD-10-CM | POA: Diagnosis not present

## 2021-05-01 DIAGNOSIS — L309 Dermatitis, unspecified: Secondary | ICD-10-CM

## 2021-05-01 DIAGNOSIS — R21 Rash and other nonspecific skin eruption: Secondary | ICD-10-CM | POA: Diagnosis not present

## 2021-05-01 MED ORDER — RYBELSUS 3 MG PO TABS: 3.0000 mg | ORAL_TABLET | Freq: Every day | ORAL | 0 refills | Status: AC

## 2021-05-01 MED ORDER — RYBELSUS 7 MG PO TABS: 7.0000 mg | ORAL_TABLET | Freq: Every day | ORAL | 3 refills | Status: DC

## 2021-05-01 MED ORDER — HYDROXYZINE PAMOATE 25 MG PO CAPS: 25.0000 mg | ORAL_CAPSULE | Freq: Three times a day (TID) | ORAL | 0 refills | Status: DC | PRN

## 2021-05-01 NOTE — Patient Instructions (Signed)
We will get you in with the dermatologist.  ? ?We have sent in the ryebelsus to take 3 mg daily for the first 1 month. Then increase to 7 mg daily (we have sent in separate prescriptions for both of these). ? ?Come back in 3 months and we can increase the dose if you are doing well. ?

## 2021-05-01 NOTE — Progress Notes (Signed)
? ?  Subjective:  ? ?Patient ID: Lindsay [name], female    DOB: Nov 15, 1945, 76 y.o.   MRN: [id number] ? ?HPI ?The patient is a 76 YO female coming in for follow up.  ? ?Review of Systems  ?Constitutional: Negative.   ?HENT: Negative.    ?Eyes: Negative.   ?Respiratory:  Negative for cough, chest tightness and shortness of breath.   ?Cardiovascular:  Negative for chest pain, palpitations and leg swelling.  ?Gastrointestinal:  Negative for abdominal distention, abdominal pain, constipation, diarrhea, nausea and vomiting.  ?Musculoskeletal: Negative.   ?Skin:  Positive for rash.  ?     itching  ?Neurological: Negative.   ?Psychiatric/Behavioral: Negative.    ? ?Objective:  ?Physical Exam ?Constitutional:   ?   Appearance: She is well-developed.  ?HENT:  ?   Head: Normocephalic and atraumatic.  ?Cardiovascular:  ?   Rate and Rhythm: Normal rate and regular rhythm.  ?Pulmonary:  ?   Effort: Pulmonary effort is normal. No respiratory distress.  ?   Breath sounds: Normal breath sounds. No wheezing or rales.  ?Abdominal:  ?   General: Bowel sounds are normal. There is no distension.  ?   Palpations: Abdomen is soft.  ?   Tenderness: There is no abdominal tenderness. There is no rebound.  ?Musculoskeletal:  ?   Cervical back: Normal range of motion.  ?Skin: ?   General: Skin is warm and dry.  ?Neurological:  ?   Mental Status: She is alert and oriented to person, place, and time.  ?   Coordination: Coordination normal.  ? ? ?Vitals:  ? 05/01/21 0958  ?BP: 130/70  ?Pulse: 72  ?Resp: 18  ?SpO2: 94%  ?Weight: 212 lb 6.4 oz (96.3 kg)  ?Height: 5\' 3"  (1.6 m)  ? ?EKG: Rate 66, axis normal, interval normal, sinus, no st or t wave changes, no significant change compared to prior 2021 ? ? ?This visit occurred during the SARS-CoV-2 public health emergency.  Safety protocols were in place, including screening questions prior to the visit, additional usage of staff PPE, and extensive cleaning of exam room while observing appropriate  contact time as indicated for disinfecting solutions.  ? ?Assessment & Plan:  ? ?

## 2021-05-03 ENCOUNTER — Encounter: Payer: Self-pay | Admitting: Internal Medicine

## 2021-05-03 LAB — POCT GLYCOSYLATED HEMOGLOBIN (HGB A1C): Hemoglobin A1C: 6.3 % — AB (ref 4.0–5.6)

## 2021-05-03 NOTE — Assessment & Plan Note (Addendum)
She had stopped metformin due to good control about 3 months ago. Weight is up slightly and POC HgA1c done today. Although Hga1c is at goal she would like to try rybelsus for weight control also. Rx rybelsus 3 mg first month then increase to 7 mg daily and return in 3 months. Can increase further at that time if tolerated. Counseled on benefit/risk.  ?

## 2021-05-03 NOTE — Assessment & Plan Note (Signed)
Increase dose of hydroxyzine to 25 mg TID prn for itching and refer to dermatology for ongoing management.  ?

## 2021-05-27 ENCOUNTER — Telehealth: Payer: Self-pay | Admitting: Internal Medicine

## 2021-05-27 NOTE — Telephone Encounter (Signed)
Pt is having serious problems with eczema.   States her appt. With dermatologist isn't until October. States she needs something to help her with this issue.    States she is itching in her head. Please call pt at earliest convenience to address issue.

## 2021-05-27 NOTE — Telephone Encounter (Signed)
She has hydroxyzine for itching and triamcinolone ointment.

## 2021-05-28 NOTE — Telephone Encounter (Signed)
Pt is saying that the medications listed are not helping with the itching.  Please advise

## 2021-05-30 NOTE — Telephone Encounter (Signed)
Called pt. Went straight to VM. VM not setup, unable to leave a message

## 2021-05-30 NOTE — Telephone Encounter (Signed)
Is she also taking zyrtec 10 mg TID for itching to help?

## 2021-05-31 NOTE — Telephone Encounter (Signed)
Noted  

## 2021-05-31 NOTE — Telephone Encounter (Signed)
PT calls back and stated she does not have her VM set up. She also noted that she was currently using the cream that was prescribed to her last year to treat the eczema. I had informed her that Dr.Crawford wanted her to start using the Zyrtec as well three times a day.  PT will be getting up with [place] again if this doesn't help. She just wanted to get something working prior to her dermatology appointment in October.

## 2021-06-10 ENCOUNTER — Other Ambulatory Visit: Payer: Self-pay | Admitting: Internal Medicine

## 2021-06-10 DIAGNOSIS — J449 Chronic obstructive pulmonary disease, unspecified: Secondary | ICD-10-CM

## 2021-06-14 ENCOUNTER — Other Ambulatory Visit: Payer: Self-pay | Admitting: Internal Medicine

## 2021-06-14 ENCOUNTER — Telehealth: Payer: Self-pay

## 2021-06-14 DIAGNOSIS — L309 Dermatitis, unspecified: Secondary | ICD-10-CM

## 2021-06-14 NOTE — Telephone Encounter (Signed)
Pt is requesting a refill on: hydrOXYzine (VISTARIL) 25 MG capsule tiZANidine (ZANAFLEX) 4 MG tablet (Dr. Kandee Keen was the presciber of this medication but pt no longer see him)  Pharmacy: [facility] [phone] - [place], [place] - [phone] AT [facility] OF [address]  LOV  05/01/21 ROV  08/01/21

## 2021-06-14 NOTE — Telephone Encounter (Signed)
Does she need to reach out to sports medicine for her Zanaflex refill?

## 2021-06-17 NOTE — Telephone Encounter (Signed)
Can we call and ask how she is taking tizanidine (how much and how many times a day)? Okay to prescribe but will need to know accurate dosing.

## 2021-06-18 MED ORDER — TIZANIDINE HCL 4 MG PO TABS: 2.0000 mg | ORAL_TABLET | Freq: Three times a day (TID) | ORAL | 1 refills | Status: DC | PRN

## 2021-06-18 MED ORDER — HYDROXYZINE PAMOATE 25 MG PO CAPS: ORAL_CAPSULE | ORAL | 0 refills | Status: DC

## 2021-06-18 NOTE — Telephone Encounter (Signed)
Spoke with the pt and she stated this taking her Zanaflex 2mg -4gmg q8h prn as needed. Medication refills have been sent to the patient's pharmacy

## 2021-06-18 NOTE — Addendum Note (Signed)
Addended by: Manuela Schwartz on: 06/18/2021 02:37 PM   Modules accepted: Orders

## 2021-07-01 ENCOUNTER — Other Ambulatory Visit: Payer: Self-pay | Admitting: Internal Medicine

## 2021-07-01 DIAGNOSIS — E2839 Other primary ovarian failure: Secondary | ICD-10-CM

## 2021-07-02 ENCOUNTER — Ambulatory Visit
Admission: RE | Admit: 2021-07-02 | Discharge: 2021-07-02 | Disposition: A | Discharge status: Home / Self Care | Payer: Medicare Other | Source: Ambulatory Visit | Attending: Internal Medicine | Admitting: Internal Medicine

## 2021-07-02 DIAGNOSIS — M85852 Other specified disorders of bone density and structure, left thigh: Secondary | ICD-10-CM | POA: Diagnosis not present

## 2021-07-02 DIAGNOSIS — Z780 Asymptomatic menopausal state: Secondary | ICD-10-CM | POA: Diagnosis not present

## 2021-07-02 DIAGNOSIS — E2839 Other primary ovarian failure: Secondary | ICD-10-CM

## 2021-07-03 ENCOUNTER — Other Ambulatory Visit: Payer: Self-pay | Admitting: Internal Medicine

## 2021-07-03 DIAGNOSIS — I10 Essential (primary) hypertension: Secondary | ICD-10-CM

## 2021-07-18 DIAGNOSIS — H0102A Squamous blepharitis right eye, upper and lower eyelids: Secondary | ICD-10-CM | POA: Diagnosis not present

## 2021-07-18 DIAGNOSIS — H0102B Squamous blepharitis left eye, upper and lower eyelids: Secondary | ICD-10-CM | POA: Diagnosis not present

## 2021-07-18 DIAGNOSIS — H1045 Other chronic allergic conjunctivitis: Secondary | ICD-10-CM | POA: Diagnosis not present

## 2021-07-18 DIAGNOSIS — H04123 Dry eye syndrome of bilateral lacrimal glands: Secondary | ICD-10-CM | POA: Diagnosis not present

## 2021-07-19 ENCOUNTER — Encounter: Payer: Self-pay | Admitting: Internal Medicine

## 2021-07-19 ENCOUNTER — Ambulatory Visit (INDEPENDENT_AMBULATORY_CARE_PROVIDER_SITE_OTHER): Payer: Medicare Other | Admitting: Internal Medicine

## 2021-07-19 VITALS — BP 130/78 | HR 75 | Resp 18 | Ht 63.0 in | Wt 211.6 lb

## 2021-07-19 DIAGNOSIS — E118 Type 2 diabetes mellitus with unspecified complications: Secondary | ICD-10-CM

## 2021-07-19 DIAGNOSIS — R5383 Other fatigue: Secondary | ICD-10-CM | POA: Diagnosis not present

## 2021-07-19 DIAGNOSIS — J019 Acute sinusitis, unspecified: Secondary | ICD-10-CM | POA: Insufficient documentation

## 2021-07-19 DIAGNOSIS — E039 Hypothyroidism, unspecified: Secondary | ICD-10-CM

## 2021-07-19 DIAGNOSIS — J0110 Acute frontal sinusitis, unspecified: Secondary | ICD-10-CM

## 2021-07-19 LAB — CBC
HCT: 33.4 % — ABNORMAL LOW (ref 36.0–46.0)
Hemoglobin: 10.7 g/dL — ABNORMAL LOW (ref 12.0–15.0)
MCHC: 32.1 g/dL (ref 30.0–36.0)
MCV: 81.8 fl (ref 78.0–100.0)
Platelets: 339 10*3/uL (ref 150.0–400.0)
RBC: 4.09 Mil/uL (ref 3.87–5.11)
RDW: 14 % (ref 11.5–15.5)
WBC: 8.8 10*3/uL (ref 4.0–10.5)

## 2021-07-19 LAB — COMPREHENSIVE METABOLIC PANEL
ALT: 9 U/L (ref 0–35)
AST: 13 U/L (ref 0–37)
Albumin: 4.1 g/dL (ref 3.5–5.2)
Alkaline Phosphatase: 55 U/L (ref 39–117)
BUN: 20 mg/dL (ref 6–23)
CO2: 28 mEq/L (ref 19–32)
Calcium: 9.6 mg/dL (ref 8.4–10.5)
Chloride: 107 mEq/L (ref 96–112)
Creatinine, Ser: 0.85 mg/dL (ref 0.40–1.20)
GFR: 67.66 mL/min (ref >60.00)
Glucose, Bld: 102 mg/dL — ABNORMAL HIGH (ref 70–99)
Potassium: 3.6 mEq/L (ref 3.5–5.1)
Sodium: 141 mEq/L (ref 135–145)
Total Bilirubin: 0.3 mg/dL (ref 0.2–1.2)
Total Protein: 7.2 g/dL (ref 6.0–8.3)

## 2021-07-19 LAB — TSH: TSH: 6.66 u[IU]/mL — ABNORMAL HIGH (ref 0.35–5.50)

## 2021-07-19 LAB — VITAMIN B12: Vitamin B-12: 590 pg/mL (ref 211–911)

## 2021-07-19 LAB — FERRITIN: Ferritin: 50.1 ng/mL (ref 10.0–291.0)

## 2021-07-19 LAB — T4, FREE: Free T4: 0.97 ng/dL (ref 0.60–1.60)

## 2021-07-19 LAB — VITAMIN D 25 HYDROXY (VIT D DEFICIENCY, FRACTURES): VITD: 40.4 ng/mL (ref 30.00–100.00)

## 2021-07-19 MED ORDER — AMOXICILLIN-POT CLAVULANATE 875-125 MG PO TABS: 1.0000 | ORAL_TABLET | Freq: Two times a day (BID) | ORAL | 0 refills | Status: AC

## 2021-07-19 NOTE — Assessment & Plan Note (Signed)
Checking CBC, CMP, ferritin, B12, vitamin D, TSH free T4 to rule out metabolic etiology. We discussed that rybelsus does have 11% side effect fatigue however her symptoms were ongoing prior to this medicine. If labs normal she may want to try stopping to see if this helps.

## 2021-07-19 NOTE — Assessment & Plan Note (Signed)
She is unsure if rybelsus is causing nasal symptoms and fatigue and may want to stop this if labs do not indicate another cause.

## 2021-07-19 NOTE — Progress Notes (Signed)
   Subjective:   Patient ID: Lindsay [name], female    DOB: 1945/08/30, 76 y.o.   MRN: [id number]  HPI The patient is a 76 YO female coming in for concerns.  Review of Systems  Constitutional:  Positive for fatigue.  HENT:  Positive for nosebleeds.   Eyes: Negative.   Respiratory:  Negative for cough, chest tightness and shortness of breath.   Cardiovascular:  Negative for chest pain, palpitations and leg swelling.  Gastrointestinal:  Positive for constipation. Negative for abdominal distention, abdominal pain, diarrhea, nausea and vomiting.  Musculoskeletal:  Positive for arthralgias.  Skin: Negative.   Neurological: Negative.   Psychiatric/Behavioral: Negative.      Objective:  Physical Exam Constitutional:      Appearance: She is well-developed. She is obese.  HENT:     Head: Normocephalic and atraumatic.  Cardiovascular:     Rate and Rhythm: Normal rate and regular rhythm.  Pulmonary:     Effort: Pulmonary effort is normal. No respiratory distress.     Breath sounds: Normal breath sounds. No wheezing or rales.  Abdominal:     General: Bowel sounds are normal. There is no distension.     Palpations: Abdomen is soft.     Tenderness: There is no abdominal tenderness. There is no rebound.  Musculoskeletal:        General: Tenderness present.     Cervical back: Normal range of motion.  Skin:    General: Skin is warm and dry.  Neurological:     Mental Status: She is alert and oriented to person, place, and time.     Coordination: Coordination normal.     Vitals:   07/19/21 1513  BP: 130/78  Pulse: 75  Resp: 18  SpO2: 98%  Weight: 211 lb 9.6 oz (96 kg)  Height: 5\' 3"  (1.6 m)    Assessment & Plan:

## 2021-07-19 NOTE — Assessment & Plan Note (Signed)
Rx augmentin 1 week course to clear.

## 2021-07-19 NOTE — Assessment & Plan Note (Signed)
Checking TSH and free T4 with new fatigue symptoms. Adjust as needed synthroid 75 mcg daily.

## 2021-07-19 NOTE — Patient Instructions (Signed)
I think you have a sinus infection. We have sent in augmentin to take 1 pill twice a day for 1 week.  We are checking the labs today for the tiredness.

## 2021-08-01 ENCOUNTER — Ambulatory Visit: Payer: Medicare Other | Admitting: Internal Medicine

## 2021-08-02 ENCOUNTER — Telehealth: Payer: Self-pay

## 2021-08-02 NOTE — Telephone Encounter (Signed)
Pt is calling requesting that an order be sent to Medicaid for a Vacuum Mattress, Pillow case, and pillow for a program that Medicaid is offering for patients with Asthma and allergies.   Pt CB [phone]  Please advise

## 2021-08-02 NOTE — Telephone Encounter (Signed)
We would need an actual place/fax to send this to

## 2021-08-05 NOTE — Telephone Encounter (Signed)
Rx given to  you

## 2021-08-07 NOTE — Telephone Encounter (Signed)
Spoke with to let her know that her prescription has been faxed to [facility] along office notes. She was very appreciative for the call and update.

## 2021-08-08 ENCOUNTER — Other Ambulatory Visit: Payer: Self-pay | Admitting: Internal Medicine

## 2021-08-26 ENCOUNTER — Telehealth: Payer: Self-pay | Admitting: *Deleted

## 2021-08-26 NOTE — Patient Outreach (Signed)
  Care Coordination   Initial Visit Note   08/26/2021 Name: Lindsay [name] MRN: [id number] DOB: 25-Feb-1945  Lindsay [name] is a 76 y.o. year old female who sees Myrlene Broker, MD for primary care. I spoke with  Lindsay [name] by phone today  What matters to the patients health and wellness today?  Sometimes I don't eat  right and I need help   Goals Addressed             This Visit's Progress    Develop plan for healthy eathy            SDOH assessments and interventions completed:  Yes     Care Coordination Interventions Activated:  Yes  Care Coordination Interventions:  Yes, provided   Follow up plan: Referral made to Kathyrn Sheriff for August 31, 023 at 2 pm    Encounter Outcome:  Pt. Visit Completed

## 2021-08-30 ENCOUNTER — Ambulatory Visit: Payer: Self-pay

## 2021-08-30 NOTE — Patient Outreach (Signed)
  Care Coordination   Initial Visit Note   08/30/2021 Name: DEBORA STOCKDALE MRN: [id number] DOB: 28-Sep-1945  Athena Masse is a 76 y.o. year old female who sees Myrlene Broker, MD for primary care. I spoke with  Athena Masse by phone today.  What matters to the patients health and wellness today?  Diet education regarding diabetes    Goals Addressed             This Visit's Progress    Develop plan for healthy eathy       Care Coordination Interventions: Discussed current diet  Provided education on diabetes and diet Encouraged to eathy healthy: fruits, vegetables, lean meats, minimize carbohydrates and food high in saturated and transfats.          SDOH assessments and interventions completed:  Yes  SDOH Interventions Today    Flowsheet Row Most Recent Value  SDOH Interventions   Food Insecurity Interventions Intervention Not Indicated  Housing Interventions Intervention Not Indicated  Transportation Interventions Intervention Not Indicated        Care Coordination Interventions Activated:  Yes  Care Coordination Interventions:  Yes, provided   Follow up plan: Follow up call scheduled for 10/28/21    Encounter Outcome:  Pt. Visit Completed   Kathyrn Sheriff, RN, MSN, BSN, CCM Care Coordinator [phone]

## 2021-08-30 NOTE — Patient Instructions (Signed)
Visit Information  Thank you for taking time to visit with me today. Please don't hesitate to contact me if I can be of assistance to you.   Following are the goals we discussed today:   Goals Addressed             This Visit's Progress    Develop plan for healthy eathy       Care Coordination Interventions: Discussed current diet  Provided education on diabetes and diet Encouraged to eathy healthy: fruits, vegetables, lean meats, minimize carbohydrates and food high in saturated and transfats.          Our next appointment is by telephone on 10/28/21 at 2:00 pm  Please call the care guide team at [phone] if you need to cancel or reschedule your appointment.   If you are experiencing a Mental Health or Behavioral Health Crisis or need someone to talk to, please call the Suicide and Crisis Lifeline: 988  The patient verbalized understanding of instructions, educational materials, and care plan provided today and agreed to receive a mailed copy of patient instructions, educational materials, and care plan.   Kathyrn Sheriff, RN, MSN, BSN, CCM Care Coordinator [phone]    Diabetes Mellitus and Nutrition, Adult When you have diabetes, or diabetes mellitus, it is very important to have healthy eating habits because your blood sugar (glucose) levels are greatly affected by what you eat and drink. Eating healthy foods in the right amounts, at about the same times every day, can help you: Manage your blood glucose. Lower your risk of heart disease. Improve your blood pressure. Reach or maintain a healthy weight. What can affect my meal plan? Every person with diabetes is different, and each person has different needs for a meal plan. Your health care provider may recommend that you work with a dietitian to make a meal plan that is best for you. Your meal plan may vary depending on factors such as: The calories you need. The medicines you take. Your weight. Your blood  glucose, blood pressure, and cholesterol levels. Your activity level. Other health conditions you have, such as heart or kidney disease. How do carbohydrates affect me? Carbohydrates, also called carbs, affect your blood glucose level more than any other type of food. Eating carbs raises the amount of glucose in your blood. It is important to know how many carbs you can safely have in each meal. This is different for every person. Your dietitian can help you calculate how many carbs you should have at each meal and for each snack. How does alcohol affect me? Alcohol can cause a decrease in blood glucose (hypoglycemia), especially if you use insulin or take certain diabetes medicines by mouth. Hypoglycemia can be a life-threatening condition. Symptoms of hypoglycemia, such as sleepiness, dizziness, and confusion, are similar to symptoms of having too much alcohol. Do not drink alcohol if: Your health care provider tells you not to drink. You are pregnant, may be pregnant, or are planning to become pregnant. If you drink alcohol: Limit how much you have to: 0-1 drink a day for women. 0-2 drinks a day for men. Know how much alcohol is in your drink. In the [place] one drink equals one 12 oz bottle of beer (355 mL), one 5 oz glass of wine (148 mL), or one 1 oz glass of hard liquor (44 mL). Keep yourself hydrated with water, diet soda, or unsweetened iced tea. Keep in mind that regular soda, juice, and other mixers may contain a  lot of sugar and must be counted as carbs. What are tips for following this plan?  Reading food labels Start by checking the serving size on the Nutrition Facts label of packaged foods and drinks. The number of calories and the amount of carbs, fats, and other nutrients listed on the label are based on one serving of the item. Many items contain more than one serving per package. Check the total grams (g) of carbs in one serving. Check the number of grams of saturated fats  and trans fats in one serving. Choose foods that have a low amount or none of these fats. Check the number of milligrams (mg) of salt (sodium) in one serving. Most people should limit total sodium intake to less than 2,300 mg per day. Always check the nutrition information of foods labeled as "low-fat" or "nonfat." These foods may be higher in added sugar or refined carbs and should be avoided. Talk to your dietitian to identify your daily goals for nutrients listed on the label. Shopping Avoid buying canned, pre-made, or processed foods. These foods tend to be high in fat, sodium, and added sugar. Shop around the outside edge of the grocery store. This is where you will most often find fresh fruits and vegetables, bulk grains, fresh meats, and fresh dairy products. Cooking Use low-heat cooking methods, such as baking, instead of high-heat cooking methods, such as deep frying. Cook using healthy oils, such as olive, canola, or sunflower oil. Avoid cooking with butter, cream, or high-fat meats. Meal planning Eat meals and snacks regularly, preferably at the same times every day. Avoid going long periods of time without eating. Eat foods that are high in fiber, such as fresh fruits, vegetables, beans, and whole grains. Eat 4-6 oz (112-168 g) of lean protein each day, such as lean meat, chicken, fish, eggs, or tofu. One ounce (oz) (28 g) of lean protein is equal to: 1 oz (28 g) of meat, chicken, or fish. 1 egg.  cup (62 g) of tofu. Eat some foods each day that contain healthy fats, such as avocado, nuts, seeds, and fish. What foods should I eat? Fruits Berries. Apples. Oranges. Peaches. Apricots. Plums. Grapes. Mangoes. Papayas. Pomegranates. Kiwi. Cherries. Vegetables Leafy greens, including lettuce, spinach, kale, chard, collard greens, mustard greens, and cabbage. Beets. Cauliflower. Broccoli. Carrots. Green beans. Tomatoes. Peppers. Onions. Cucumbers. Brussels sprouts. Grains Whole grains,  such as whole-wheat or whole-grain bread, crackers, tortillas, cereal, and pasta. Unsweetened oatmeal. Quinoa. Brown or wild rice. Meats and other proteins Seafood. Poultry without skin. Lean cuts of poultry and beef. Tofu. Nuts. Seeds. Dairy Low-fat or fat-free dairy products such as milk, yogurt, and cheese. The items listed above may not be a complete list of foods and beverages you can eat and drink. Contact a dietitian for more information. What foods should I avoid? Fruits Fruits canned with syrup. Vegetables Canned vegetables. Frozen vegetables with butter or cream sauce. Grains Refined white flour and flour products such as bread, pasta, snack foods, and cereals. Avoid all processed foods. Meats and other proteins Fatty cuts of meat. Poultry with skin. Breaded or fried meats. Processed meat. Avoid saturated fats. Dairy Full-fat yogurt, cheese, or milk. Beverages Sweetened drinks, such as soda or iced tea. The items listed above may not be a complete list of foods and beverages you should avoid. Contact a dietitian for more information. Questions to ask a health care provider Do I need to meet with a certified diabetes care and education specialist? Do I  need to meet with a dietitian? What number can I call if I have questions? When are the best times to check my blood glucose? Where to find more information: American Diabetes Association: diabetes.org Academy of Nutrition and Dietetics: eatright.[employer] of Diabetes and Digestive and Kidney Diseases: StageSync.si Association of Diabetes Care & Education Specialists: diabeteseducator.org Summary It is important to have healthy eating habits because your blood sugar (glucose) levels are greatly affected by what you eat and drink. It is important to use alcohol carefully. A healthy meal plan will help you manage your blood glucose and lower your risk of heart disease. Your health care provider may recommend that  you work with a dietitian to make a meal plan that is best for you. This information is not intended to replace advice given to you by your health care provider. Make sure you discuss any questions you have with your health care provider. Document Revised: 07/27/2019 Document Reviewed: 07/27/2019 Elsevier Patient Education  2023 [employer].

## 2021-09-05 ENCOUNTER — Telehealth: Payer: Self-pay | Admitting: Internal Medicine

## 2021-09-05 NOTE — Telephone Encounter (Signed)
[employer] is calling stating that pt is needing a PA for the following: Vacuum Mattress, Pillow case, and pillow for a program that Medicaid is offering for patients with Asthma and allergies.  [employer] states that the request needs to be submitted to [facility] under DME  Fax number [phone]. Provider number is [phone]

## 2021-09-06 NOTE — Telephone Encounter (Signed)
Do we still have the rx to fax? It was a paper rx as none of those things exist in epic to order

## 2021-09-07 ENCOUNTER — Emergency Department (HOSPITAL_COMMUNITY)
Admission: EM | Admit: 2021-09-07 | Discharge: 2021-09-07 | Disposition: A | Discharge status: Home / Self Care | Payer: Medicare Other | Attending: Emergency Medicine | Admitting: Emergency Medicine

## 2021-09-07 ENCOUNTER — Encounter (HOSPITAL_COMMUNITY): Payer: Self-pay | Admitting: Emergency Medicine

## 2021-09-07 ENCOUNTER — Other Ambulatory Visit: Payer: Self-pay

## 2021-09-07 DIAGNOSIS — Z79899 Other long term (current) drug therapy: Secondary | ICD-10-CM | POA: Diagnosis not present

## 2021-09-07 DIAGNOSIS — H1131 Conjunctival hemorrhage, right eye: Secondary | ICD-10-CM | POA: Diagnosis not present

## 2021-09-07 DIAGNOSIS — E119 Type 2 diabetes mellitus without complications: Secondary | ICD-10-CM | POA: Insufficient documentation

## 2021-09-07 DIAGNOSIS — H5789 Other specified disorders of eye and adnexa: Secondary | ICD-10-CM | POA: Diagnosis present

## 2021-09-07 DIAGNOSIS — J45909 Unspecified asthma, uncomplicated: Secondary | ICD-10-CM | POA: Diagnosis not present

## 2021-09-07 DIAGNOSIS — I10 Essential (primary) hypertension: Secondary | ICD-10-CM | POA: Diagnosis not present

## 2021-09-07 DIAGNOSIS — Z7984 Long term (current) use of oral hypoglycemic drugs: Secondary | ICD-10-CM | POA: Diagnosis not present

## 2021-09-07 DIAGNOSIS — Z7951 Long term (current) use of inhaled steroids: Secondary | ICD-10-CM | POA: Diagnosis not present

## 2021-09-07 NOTE — ED Notes (Signed)
Patient verbalizes understanding of discharge instructions. Opportunity for questioning and answers were provided. Pt discharged from ED. 

## 2021-09-07 NOTE — ED Provider Notes (Signed)
[facility] EMERGENCY DEPARTMENT Provider Note   CSN: [id number] Arrival date & time: 09/07/21  1015     History  Chief Complaint  Patient presents with   Eye Problem    Lindsay [name] is a 76 y.o. female who presents to the ED complaining of left eye redness onset yesterday.  Notes that her right eye is now red as well.  Denies sick contacts of similar symptoms. Denies wearing contacts or glasses.  No meds tried prior to arrival.  Denies vision changes, eye drainage, pain.  Does not have an eye doctor at this time.  No allergies to medications.  She notes that she did lift heavy items prior to the onset of her symptoms that she was coughing prior to the onset of her symptoms.  The history is provided by the patient. No language interpreter was used.       Home Medications Prior to Admission medications   Medication Sig Start Date End Date Taking? Authorizing Provider  albuterol (VENTOLIN HFA) 108 (90 Base) MCG/ACT inhaler INHALE 2 PUFFS INTO THE LUNGS EVERY 6 HOURS AS NEEDED FOR WHEEZING OR SHORTNESS OF BREATH 06/10/21   Myrlene Broker, MD  betamethasone valerate ointment (VALISONE) 0.1 % Apply 1 application topically 2 (two) times daily. 10/24/20   Myrlene Broker, MD  budesonide-formoterol [facility]) 80-4.5 MCG/ACT inhaler INHALE 2 PUFFS INTO THE LUNGS TWICE DAILY 06/10/21   Myrlene Broker, MD  cetirizine (ZYRTEC) 10 MG tablet Take 1 tablet (10 mg total) by mouth daily. FOR ASTHMA Patient taking differently: Take 10 mg by mouth daily. 09/05/19   Glenford Bayley, NP  Cholecalciferol (VITAMIN D3) 50 MCG (2000 UT) capsule Take 1 capsule (2,000 Units total) by mouth daily. Must keep scheduled follow=-up appt for future refills 08/06/19   Medina-Vargas, Monina C, NP  famotidine (PEPCID) 20 MG tablet One after supper 03/09/20   Nyoka Cowden, MD  gabapentin (NEURONTIN) 300 MG capsule Take 300 mg by mouth 3 (three) times daily.    [provider]  hydrOXYzine (VISTARIL) 25 MG capsule TAKE 1 CAPSULE(25 MG) BY MOUTH EVERY 8 HOURS AS NEEDED 06/18/21   Myrlene Broker, MD  levothyroxine (SYNTHROID) 75 MCG tablet Take 1 tablet (75 mcg total) by mouth daily before breakfast. 03/11/21   Shamleffer, Konrad Dolores, MD  losartan (COZAAR) 50 MG tablet TAKE 1 TABLET(50 MG) BY MOUTH DAILY 07/03/21   Myrlene Broker, MD  meloxicam (MOBIC) 7.5 MG tablet Take 1 tablet (7.5 mg total) by mouth daily. 01/30/21   Myrlene Broker, MD  Menthol-Methyl Salicylate (SALONPAS PAIN RELIEF PATCH EX) Place 1 patch onto the skin daily as needed (pain).    [provider]  montelukast (SINGULAIR) 10 MG tablet TAKE 1 TABLET(10 MG) BY MOUTH AT BEDTIME 10/07/20   Nyoka Cowden, MD  nitrofurantoin, macrocrystal-monohydrate, (MACROBID) 100 MG capsule Take 1 capsule (100 mg total) by mouth 2 (two) times daily. 01/30/21   Myrlene Broker, MD  NON FORMULARY DIET- REGULAR CCD 07/27/19   [provider]  oxybutynin (DITROPAN) 5 MG tablet Take 1 tablet (5 mg total) by mouth 2 (two) times daily. Patient taking differently: Take 10 mg by mouth daily. 08/06/19   Medina-Vargas, Monina C, NP  pantoprazole (PROTONIX) 40 MG tablet TAKE 1 TABLET(40 MG) BY MOUTH DAILY 30 TO 60 MINUTES BEFORE FIRST MEAL OF THE DAY 08/08/21   Myrlene Broker, MD  polyvinyl alcohol (LIQUIFILM TEARS) 1.4 % ophthalmic solution Place  1 drop into both eyes as needed for dry eyes. 08/06/19   Medina-Vargas, Monina C, NP  Semaglutide (RYBELSUS) 7 MG TABS Take 7 mg by mouth daily. 06/01/21   Myrlene Broker, MD  simvastatin (ZOCOR) 40 MG tablet TAKE 1 TABLET(40 MG) BY MOUTH DAILY AT 6 PM 04/05/21   Myrlene Broker, MD  tiZANidine (ZANAFLEX) 4 MG tablet Take 0.5-1 tablets (2-4 mg total) by mouth every 8 (eight) hours as needed for muscle spasms. 06/18/21   Myrlene Broker, MD  triamcinolone cream (KENALOG) 0.1 % Apply 1 application topically 2 (two) times daily.  05/31/20   Myrlene Broker, MD      Allergies    Codeine, Lisinopril, Morphine and related, and Shellfish allergy    Review of Systems   Review of Systems  Eyes:  Positive for redness. Negative for photophobia, pain, discharge, itching and visual disturbance.  Neurological:  Negative for weakness and numbness.       -tingling  All other systems reviewed and are negative.   Physical Exam Updated Vital Signs BP (!) 156/94 (BP Location: Right Arm)   Pulse 73   Temp 98.2 F (36.8 C) (Oral)   Resp 16   Ht 5\' 3"  (1.6 m)   Wt 96 kg   SpO2 99%   BMI 37.49 kg/m  Physical Exam Vitals and nursing note reviewed.  Constitutional:      General: She is not in acute distress.    Appearance: Normal appearance. She is not ill-appearing.  HENT:     Head: Normocephalic and atraumatic.     Nose: Nose normal. No congestion or rhinorrhea.     Mouth/Throat:     Mouth: Mucous membranes are moist.     Pharynx: Oropharynx is clear. No oropharyngeal exudate or posterior oropharyngeal erythema.  Eyes:     General: No scleral icterus.       Right eye: No foreign body or discharge.        Left eye: No foreign body or discharge.     Extraocular Movements: Extraocular movements intact.     Conjunctiva/sclera:     Right eye: Hemorrhage present.     Pupils: Pupils are equal, round, and reactive to light.     Comments: Subconjunctival hemorrhage noted to right sclera. No draining or crusting noted. EOMI. PERRL.   Cardiovascular:     Rate and Rhythm: Normal rate.  Pulmonary:     Effort: Pulmonary effort is normal. No respiratory distress.  Musculoskeletal:        General: Normal range of motion.     Cervical back: Neck supple.  Skin:    General: Skin is warm and dry.     Findings: No rash.  Neurological:     General: No focal deficit present.     Mental Status: She is alert.     Cranial Nerves: Cranial nerves 2-12 are intact.     Sensory: Sensation is intact.     Motor: Motor function is  intact. No pronator drift.     Gait: Gait normal.     ED Results / Procedures / Treatments   Labs (all labs ordered are listed, but only abnormal results are displayed) Labs Reviewed - No data to display  EKG None  Radiology No results found.  Procedures Procedures    Medications Ordered in ED Medications - No data to display  ED Course/ Medical Decision Making/ A&P  Medical Decision Making  Pt presents with concerns for right eye problem onset this morning.  No vision changes.  Denies discharge, itching, pain, photophobia.  No numbness or tingling or weakness.  Has tried over-the-counter Pataday as well as prescription eyedrops that were prescribed several months ago by her ophthalmologist. Vital signs, patient afebrile. On exam, pt with subconjunctival hemorrhage noted to right eye.  No visible crusting or drainage noted to eye.  No focal neurologic deficits.  Cranial nerves II through XII intact.  Strength sensation intact to bilateral upper and lower extremities.  Negative pronator drift.  Able to ambulate without assistance or difficulty with her cane. Differential diagnosis includes corneal abrasion, conjunctivitis, subconjunctival hemorrhage.    Co morbidities that complicate the patient evaluation: Hypertension. Diabetes  Disposition: Please stay suspicious for subconjunctival hemorrhage.  Doubt corneal abrasion or conjunctivitis at this time.  Patient denies foreign body sensation to eye.  No visible drainage or crusting noted to eyes.  No vision changes noted per patient. After consideration of the diagnostic results and the patients response to treatment, I feel that the patient would benefit from Discharge home.  Patient discharged home with instructions to follow-up with her ophthalmologist Dr. Dione Booze on 09/10/2021.  Supportive care measures and strict return precautions discussed with patient at bedside. Pt acknowledges and verbalizes  understanding. Pt appears safe for discharge. Follow up as indicated in discharge paperwork.    This chart was dictated using voice recognition software, Dragon. Despite the best efforts of this provider to proofread and correct errors, errors may still occur which can change documentation meaning.  Final Clinical Impression(s) / ED Diagnoses Final diagnoses:  Subconjunctival hemorrhage of right eye    Rx / DC Orders ED Discharge Orders     None         Damond Borchers A, PA-C 09/07/21 1203    Gloris Manchester, MD 09/07/21 1807

## 2021-09-07 NOTE — Discharge Instructions (Signed)
It was a pleasure taking care of you today!   Call your ophthalmologist, Dr. Dione Booze on 09/10/2021 to set up a follow-up appointment regarding today's ED visit.  You may follow-up with your primary care provider regarding today's ED visit.  Return to the emergency department if you are experiencing increasing/worsening eye pain, drainage, crusting, worsening symptoms.

## 2021-09-07 NOTE — ED Triage Notes (Signed)
Pt states her left eye was red yesterday and now her right eye is red too. Denies vision changes.

## 2021-09-10 ENCOUNTER — Telehealth: Payer: Self-pay | Admitting: Internal Medicine

## 2021-09-10 NOTE — Telephone Encounter (Signed)
PT visits today with an application for renewal of their disability placard. Form has been left in Dr.Crawford's mailbox. PT would like to be notified once it is filled out.  CB: [phone]

## 2021-09-11 ENCOUNTER — Telehealth: Payer: Self-pay | Admitting: *Deleted

## 2021-09-11 NOTE — Telephone Encounter (Signed)
New rx done

## 2021-09-11 NOTE — Telephone Encounter (Signed)
Notified pt --form for Disability Parking Placard is complete and ready to be pick at the front office.

## 2021-09-30 ENCOUNTER — Other Ambulatory Visit: Payer: Self-pay | Admitting: Internal Medicine

## 2021-10-01 DIAGNOSIS — H1131 Conjunctival hemorrhage, right eye: Secondary | ICD-10-CM | POA: Diagnosis not present

## 2021-10-07 DIAGNOSIS — L298 Other pruritus: Secondary | ICD-10-CM | POA: Diagnosis not present

## 2021-10-07 DIAGNOSIS — L2089 Other atopic dermatitis: Secondary | ICD-10-CM | POA: Diagnosis not present

## 2021-10-21 ENCOUNTER — Ambulatory Visit: Payer: Medicare Other | Admitting: Internal Medicine

## 2021-10-22 ENCOUNTER — Ambulatory Visit (INDEPENDENT_AMBULATORY_CARE_PROVIDER_SITE_OTHER): Payer: Medicare Other | Admitting: Internal Medicine

## 2021-10-22 ENCOUNTER — Encounter: Payer: Self-pay | Admitting: Internal Medicine

## 2021-10-22 VITALS — BP 100/80 | HR 83 | Temp 98.1°F | Ht 63.0 in | Wt 209.0 lb

## 2021-10-22 DIAGNOSIS — E118 Type 2 diabetes mellitus with unspecified complications: Secondary | ICD-10-CM

## 2021-10-22 DIAGNOSIS — J42 Unspecified chronic bronchitis: Secondary | ICD-10-CM

## 2021-10-22 DIAGNOSIS — E785 Hyperlipidemia, unspecified: Secondary | ICD-10-CM | POA: Diagnosis not present

## 2021-10-22 DIAGNOSIS — Z0000 Encounter for general adult medical examination without abnormal findings: Secondary | ICD-10-CM

## 2021-10-22 DIAGNOSIS — I10 Essential (primary) hypertension: Secondary | ICD-10-CM | POA: Diagnosis not present

## 2021-10-22 DIAGNOSIS — Z1211 Encounter for screening for malignant neoplasm of colon: Secondary | ICD-10-CM

## 2021-10-22 DIAGNOSIS — E1169 Type 2 diabetes mellitus with other specified complication: Secondary | ICD-10-CM | POA: Diagnosis not present

## 2021-10-22 LAB — POCT GLYCOSYLATED HEMOGLOBIN (HGB A1C): Hemoglobin A1C: 5.3 % (ref 4.0–5.6)

## 2021-10-22 MED ORDER — BETAMETHASONE VALERATE 0.1 % EX OINT: 1.0000 | TOPICAL_OINTMENT | Freq: Two times a day (BID) | CUTANEOUS | 5 refills | Status: DC

## 2021-10-22 MED ORDER — NITROFURANTOIN MONOHYD MACRO 100 MG PO CAPS: 100.0000 mg | ORAL_CAPSULE | Freq: Two times a day (BID) | ORAL | 3 refills | Status: DC

## 2021-10-22 NOTE — Progress Notes (Signed)
   Subjective:   Patient ID: Lindsay [name], female    DOB: 1945/03/02, 76 y.o.   MRN: [id number]  HPI The patient is a 76 YO female coming in for physical  PMH, [facility], social history reviewed and updated  Review of Systems  Constitutional: Negative.   HENT: Negative.    Eyes: Negative.   Respiratory:  Negative for cough, chest tightness and shortness of breath.   Cardiovascular:  Negative for chest pain, palpitations and leg swelling.  Gastrointestinal:  Negative for abdominal distention, abdominal pain, constipation, diarrhea, nausea and vomiting.  Musculoskeletal: Negative.   Skin: Negative.   Neurological: Negative.   Psychiatric/Behavioral: Negative.      Objective:  Physical Exam Constitutional:      Appearance: She is well-developed.  HENT:     Head: Normocephalic and atraumatic.  Cardiovascular:     Rate and Rhythm: Normal rate and regular rhythm.  Pulmonary:     Effort: Pulmonary effort is normal. No respiratory distress.     Breath sounds: Normal breath sounds. No wheezing or rales.  Abdominal:     General: Bowel sounds are normal. There is no distension.     Palpations: Abdomen is soft.     Tenderness: There is no abdominal tenderness. There is no rebound.  Musculoskeletal:     Cervical back: Normal range of motion.  Skin:    General: Skin is warm and dry.  Neurological:     Mental Status: She is alert and oriented to person, place, and time.     Coordination: Coordination normal.    Vitals:   10/22/21 0917  BP: 100/80  Pulse: 83  Temp: 98.1 F (36.7 C)  TempSrc: Oral  SpO2: 98%  Weight: 209 lb (94.8 kg)  Height: 5\' 3"  (1.6 m)    Assessment & Plan:

## 2021-10-22 NOTE — Patient Instructions (Signed)
The Hga1c is 5.3 today!

## 2021-10-22 NOTE — Assessment & Plan Note (Signed)
Flu shot declines. Pneumonia complete. Shingrix complete. Tetanus declines. Cologuard ordered. Mammogram due 2024, pap smear aged out and dexa complete. Counseled about sun safety and mole surveillance. Counseled about the dangers of distracted driving. Given 10 year screening recommendations.

## 2021-10-22 NOTE — Assessment & Plan Note (Signed)
Uses albuterol prn and no flare today. Also has symbicort to use prn for flares.

## 2021-10-22 NOTE — Assessment & Plan Note (Signed)
Recent lipid panel at goal. Continue simvastatin 40 mg daily.

## 2021-10-22 NOTE — Assessment & Plan Note (Signed)
Foot exam done and HgA1c done today for follow up. She is taking rybelsus 7 mg daily and weight is decreasing about 3-4 pounds since starting. Is taking ARB and statin.

## 2021-10-22 NOTE — Assessment & Plan Note (Signed)
BMI 37 and complicated by diabetes, hypertension, hyperlipidemia. Counseled about diet and exercise. Taking rybelsus to help with weight loss.

## 2021-10-22 NOTE — Assessment & Plan Note (Signed)
BP at goal on losartan 50 mg daily. Recent BMP without indication for change. Continue.

## 2021-10-25 ENCOUNTER — Other Ambulatory Visit: Payer: Self-pay | Admitting: Internal Medicine

## 2021-10-25 DIAGNOSIS — Z1231 Encounter for screening mammogram for malignant neoplasm of breast: Secondary | ICD-10-CM

## 2021-10-28 ENCOUNTER — Ambulatory Visit: Payer: Self-pay

## 2021-10-28 NOTE — Patient Instructions (Addendum)
Visit Information  Thank you for taking time to visit with me today. Please don't hesitate to contact me if I can be of assistance to you.   Following are the goals we discussed today:   Goals Addressed             This Visit's Progress    COMPLETED: Develop plan for healthy eathy       Care Coordination Interventions: Encouraged to continue to eathy healthy: fruits, vegetables, lean meats, minimize carbohydrates and food high in saturated and transfats  Encouraged to continue to attend provider visits as scheduled/recommended Discussed importance of exercise and remaining active Encouraged patient to contact pulmonologist to schedule an appointment and discuss possible programs available Encouraged patient to follow up with her insurance company and speak with her navigator to find out dental coverage and providers in network. Encouraged patient to contact care coordinator or primary care provider f care coordination needs in the future.        If you are experiencing a Mental Health or [place] or need someone to talk to, please call the Suicide and Crisis Lifeline: 988  The patient verbalized understanding of instructions, educational materials, and care plan provided today and DECLINED offer to receive copy of patient instructions, educational materials, and care plan.   Thea Silversmith, RN, MSN, BSN, [place] Coordinator [phone]

## 2021-10-28 NOTE — Patient Outreach (Signed)
  Care Coordination   Follow Up Visit Note   10/28/2021 Name: Lindsay [name] MRN: [id number] DOB: 08-28-1945  Lindsay [name] is a 76 y.o. year old female who sees Hoyt Koch, MD for primary care. I spoke with  Lindsay [name] by phone today.  What matters to the patients health and wellness today?  Patient reports she saw primary care provider 10/22/21 and was given a good report. A1C 5.3. Patient is very pleased and report provider was very pleased. Patient reports she has not seen pulmonologist in a long time. Per review of chart last visit with pulmonologist 04/26/20. She asked about a program that will assist with vacuum cleaner; mattress and pillow covers in order to minimize asthma flares. Patient also with history of COPD. Patient reports she is in need of dental care, states she will follow up with navigator with health plan regarding resource needs. Care Coordinator discussed closing case. Patient in agreement.  Goals Addressed             This Visit's Progress    COMPLETED: care coordination activities       Encouraged to continue to attend provider visits as scheduled/recommended Discussed importance of exercise and remaining active Encouraged patient to contact pulmonologist to schedule an appointment and discuss possible programs available Encouraged patient to follow up with her insurance company and speak with her navigator to find out dental coverage and providers in network and other benefits. Care coordination with care team, LCSW regarding resource needs     COMPLETED: Develop plan for healthy eating       Care Coordination Interventions: Encouraged to continue to eathy healthy: fruits, vegetables, lean meats, minimize carbohydrates and food high in saturated and transfats         SDOH assessments and interventions completed:  No  Care Coordination Interventions Activated:  Yes  Care Coordination Interventions:  Yes, provided   Follow up plan: No  further intervention required.   Encounter Outcome:  Pt. Visit Completed   Thea Silversmith, RN, MSN, BSN, [place] Coordinator [phone]

## 2021-11-01 ENCOUNTER — Other Ambulatory Visit: Payer: Self-pay | Admitting: Internal Medicine

## 2021-11-04 DIAGNOSIS — Z1211 Encounter for screening for malignant neoplasm of colon: Secondary | ICD-10-CM | POA: Diagnosis not present

## 2021-11-12 LAB — COLOGUARD: COLOGUARD: NEGATIVE

## 2021-12-09 DIAGNOSIS — H0102B Squamous blepharitis left eye, upper and lower eyelids: Secondary | ICD-10-CM | POA: Diagnosis not present

## 2021-12-09 DIAGNOSIS — H0102A Squamous blepharitis right eye, upper and lower eyelids: Secondary | ICD-10-CM | POA: Diagnosis not present

## 2021-12-09 DIAGNOSIS — H43813 Vitreous degeneration, bilateral: Secondary | ICD-10-CM | POA: Diagnosis not present

## 2021-12-09 DIAGNOSIS — H2513 Age-related nuclear cataract, bilateral: Secondary | ICD-10-CM | POA: Diagnosis not present

## 2021-12-09 DIAGNOSIS — H04123 Dry eye syndrome of bilateral lacrimal glands: Secondary | ICD-10-CM | POA: Diagnosis not present

## 2021-12-09 DIAGNOSIS — H1045 Other chronic allergic conjunctivitis: Secondary | ICD-10-CM | POA: Diagnosis not present

## 2021-12-09 DIAGNOSIS — E119 Type 2 diabetes mellitus without complications: Secondary | ICD-10-CM | POA: Diagnosis not present

## 2021-12-09 LAB — HM DIABETES EYE EXAM

## 2021-12-17 ENCOUNTER — Ambulatory Visit
Admission: RE | Admit: 2021-12-17 | Discharge: 2021-12-17 | Disposition: A | Discharge status: Home / Self Care | Payer: Medicare Other | Source: Ambulatory Visit | Attending: Internal Medicine | Admitting: Internal Medicine

## 2021-12-17 DIAGNOSIS — Z1231 Encounter for screening mammogram for malignant neoplasm of breast: Secondary | ICD-10-CM

## 2021-12-26 ENCOUNTER — Telehealth: Payer: Self-pay | Admitting: Internal Medicine

## 2021-12-26 NOTE — Telephone Encounter (Signed)
Patient called and stated that she received a letter from [facility] that her medication RYBELSUS 7 MG TABS needs Prior Authorization needs a renewal for the new year 2024. Patient wants a callback at [phone].

## 2021-12-27 ENCOUNTER — Other Ambulatory Visit: Payer: Self-pay | Admitting: Nurse Practitioner

## 2021-12-27 DIAGNOSIS — E118 Type 2 diabetes mellitus with unspecified complications: Secondary | ICD-10-CM

## 2021-12-27 MED ORDER — BLOOD GLUCOSE MONITOR KIT: PACK | 0 refills | Status: DC

## 2021-12-27 NOTE — Telephone Encounter (Signed)
Having difficulties staring the PA for ms Mcduffie due to insurance information. Will try again

## 2022-01-16 ENCOUNTER — Telehealth: Payer: Self-pay | Admitting: Internal Medicine

## 2022-01-16 NOTE — Telephone Encounter (Signed)
Left message for patient to call back to schedule Medicare Annual Wellness Visit   Last AWV  01/28/21  Please schedule at anytime with [facility]-N[facility] Health Advisor if patient calls the office back.    30 Minutes appointment   Any questions, please call me at [phone] 

## 2022-01-31 ENCOUNTER — Other Ambulatory Visit: Payer: Self-pay | Admitting: Internal Medicine

## 2022-02-18 ENCOUNTER — Ambulatory Visit: Payer: Medicare Other

## 2022-03-05 ENCOUNTER — Telehealth: Payer: Self-pay

## 2022-03-05 NOTE — Telephone Encounter (Signed)
Contacted Lindsay [name] to schedule their annual wellness visit. Appointment made for 03/13/22.  *patient need to reschedule 03/10/22 appointment*  Norton Blizzard, [place] ([place])  [place] [facility] [phone]

## 2022-03-10 ENCOUNTER — Telehealth: Payer: Self-pay | Admitting: Internal Medicine

## 2022-03-10 ENCOUNTER — Encounter: Payer: Self-pay | Admitting: Internal Medicine

## 2022-03-10 ENCOUNTER — Ambulatory Visit: Payer: Medicare Other

## 2022-03-10 ENCOUNTER — Ambulatory Visit (INDEPENDENT_AMBULATORY_CARE_PROVIDER_SITE_OTHER): Payer: 59 | Admitting: Internal Medicine

## 2022-03-10 VITALS — BP 104/68 | HR 84 | Ht 63.0 in | Wt 205.0 lb

## 2022-03-10 DIAGNOSIS — K5900 Constipation, unspecified: Secondary | ICD-10-CM

## 2022-03-10 DIAGNOSIS — E039 Hypothyroidism, unspecified: Secondary | ICD-10-CM

## 2022-03-10 LAB — TSH: TSH: 5.94 u[IU]/mL — ABNORMAL HIGH (ref 0.35–5.50)

## 2022-03-10 MED ORDER — LEVOTHYROXINE SODIUM 88 MCG PO TABS: 88.0000 ug | ORAL_TABLET | Freq: Every day | ORAL | 3 refills | Status: DC

## 2022-03-10 NOTE — Progress Notes (Signed)
Name: Lindsay [name]  MRN/ DOB: [id number], [birth year]    Age/ Sex: 77 y.o., female     PCP: Hoyt Koch, MD   Reason for Endocrinology Evaluation: Hypothyroidism     Initial Endocrinology Clinic Visit: 01/11/2019    PATIENT IDENTIFIER: Lindsay [name] is a 77 y.o., female with a past medical history of HTN, T2DM and Hypothyroidism. She has followed with [facility] since 01/11/2019 for consultative assistance with management of her hypothyroidism.     HISTORICAL SUMMARY: The patient was diagnosed with hypothyroidism in 12/2018 with a TSH 25.71 uIU/mL . Was started on levothyroxine 50 mcg but developed diarrhea that she attributed to LT-4 replacement and stopped it.  She was encouraged to restart it on her initial visit to our clinic  SUBJECTIVE:   Today (03/10/2022):  Lindsay [name] is here for a follow up on hypothyroidism.    Pt has been noted with weight loss  Has been constipated, takes ducolax  Has been occasional chest pain, pending PCP appointment  Denies palpitations  Denies tremors  No local neck symptoms     Levothyroxine 75 mcg daily    HISTORY:  Past Medical History:  Past Medical History:  Diagnosis Date   Acid reflux    Anemia    Arthritis    Asthma    COPD (chronic obstructive pulmonary disease) ([facility])    Diabetes mellitus without complication (HCC)    TYPE 2    Hyperglycemia    Hypertension    Hypokalemia    Hypothyroidism    Obesity    PONV (postoperative nausea and vomiting)    Spinal stenosis    Urinary tract infection, chronic    Past Surgical History:  Past Surgical History:  Procedure Laterality Date   ABDOMINAL HYSTERECTOMY     ANKLE FUSION Left    CHOLECYSTECTOMY     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 Her  smoking use included cigarettes. She has a 10.00 pack-year smoking history. She has never used smokeless tobacco. She reports that she does not drink alcohol and does not use drugs. Family History:  Family History  Problem Relation Age of Onset   Microcephaly Maternal Grandmother    Heart disease Maternal Grandmother        Not clear details, patient just knows she took "heart pills"   Asthma Daughter      HOME MEDICATIONS: Allergies as of 03/10/2022       Reactions   Codeine Nausea Only   Lisinopril Cough   Morphine And Related Itching   Shellfish Allergy Itching        Medication List        Accurate as of March 10, 2022  9:29 AM. If you have any questions, ask your nurse or doctor.          STOP taking these medications    betamethasone valerate ointment 0.1 % Commonly known as: VALISONE Stopped by: Dorita Sciara, MD   ketorolac 0.5 % ophthalmic solution Commonly known as: ACULAR Stopped by: Dorita Sciara, MD   nitrofurantoin (macrocrystal-monohydrate) 100 MG capsule Commonly known as: Macrobid Stopped by: Dorita Sciara, MD   polyvinyl alcohol 1.4 % ophthalmic solution Commonly known as: LIQUIFILM TEARS Stopped by: Dorita Sciara, MD   Restasis 0.05 %  ophthalmic emulsion Generic drug: cycloSPORINE Stopped by: Dorita Sciara, MD       TAKE these medications    albuterol 108 (90 Base) MCG/ACT inhaler Commonly known as: VENTOLIN HFA INHALE 2 PUFFS INTO THE LUNGS EVERY 6 HOURS AS NEEDED FOR WHEEZING OR SHORTNESS OF BREATH   blood glucose meter kit and supplies Kit Check blood sugar with glucometer every morning before your first meal   budesonide-formoterol 80-4.5 MCG/ACT inhaler Commonly known as: SYMBICORT INHALE 2 PUFFS INTO THE LUNGS TWICE DAILY   cetirizine 10 MG tablet Commonly known as: ZYRTEC Take 1 tablet (10 mg total) by mouth daily. FOR ASTHMA What changed: additional instructions   cyclobenzaprine 10  MG tablet Commonly known as: FLEXERIL Take by mouth.   famotidine 20 MG tablet Commonly known as: Pepcid One after supper   gabapentin 300 MG capsule Commonly known as: NEURONTIN Take 300 mg by mouth 3 (three) times daily.   hydrOXYzine 25 MG capsule Commonly known as: VISTARIL TAKE 1 CAPSULE(25 MG) BY MOUTH EVERY 8 HOURS AS NEEDED   levothyroxine 75 MCG tablet Commonly known as: SYNTHROID Take 1 tablet (75 mcg total) by mouth daily before breakfast.   losartan 50 MG tablet Commonly known as: COZAAR TAKE 1 TABLET(50 MG) BY MOUTH DAILY   meloxicam 7.5 MG tablet Commonly known as: MOBIC TAKE 1 TABLET(7.5 MG) BY MOUTH DAILY   montelukast 10 MG tablet Commonly known as: SINGULAIR TAKE 1 TABLET(10 MG) BY MOUTH AT BEDTIME   NON FORMULARY DIET- REGULAR CCD   oxybutynin 5 MG tablet Commonly known as: DITROPAN Take 1 tablet (5 mg total) by mouth 2 (two) times daily. What changed:  how much to take when to take this   pantoprazole 40 MG tablet Commonly known as: PROTONIX TAKE 1 TABLET(40 MG) BY MOUTH DAILY 30 TO 60 MINUTES BEFORE FIRST MEAL OF THE DAY   Pfizer COVID-19 Vac Bivalent injection Generic drug: COVID-19 mRNA bivalent vaccine (Pfizer)   Rybelsus 7 MG Tabs Generic drug: Semaglutide TAKE 1 TABLET BY MOUTH DAILY   SALONPAS PAIN RELIEF PATCH EX Place 1 patch onto the skin daily as needed (pain).   simvastatin 40 MG tablet Commonly known as: ZOCOR TAKE 1 TABLET(40 MG) BY MOUTH DAILY AT 6 PM   tiZANidine 4 MG tablet Commonly known as: ZANAFLEX Take 0.5-1 tablets (2-4 mg total) by mouth every 8 (eight) hours as needed for muscle spasms.   Vitamin D3 50 MCG (2000 UT) Caps Generic drug: Cholecalciferol Take 1 capsule (2,000 Units total) by mouth daily. Must keep scheduled follow=-up appt for future refills          OBJECTIVE:   PHYSICAL EXAM: VS: BP 104/68 (BP Location: Left Arm, Patient Position: Sitting, Cuff Size: Large)   Pulse 84   Ht '5\' 3"'$   (1.6 m)   Wt 205 lb (93 kg)   SpO2 96%   BMI 36.31 kg/m    EXAM: General: Pt appears well and is in NAD  Neck: General: Supple without adenopathy. Thyroid: Thyroid size normal.  No goiter or nodules appreciated.   Lungs: Clear with good BS bilat with no rales, rhonchi, or wheezes  Heart: Auscultation: RRR.  Extremities:  BL LE: No pretibial edema normal ROM and strength.  Mental Status: Judgment, insight: Intact Mood and affect: No depression, anxiety, or agitation     DATA REVIEWED:  Latest Reference Range & Units 03/10/22 09:45  TSH 0.35 - 5.50 uIU/mL 5.94 (H)  (H): Data is abnormally high  ASSESSMENT / PLAN / RECOMMENDATIONS:   Hypothyroidism:   - Pt with worsening constipation - No local neck symptoms - TSH remains elevated, will increase levothyroxine as below   Medications  Stop levothyroxine 75 mcg daily  Start levothyroxine 88 mcg daily   2. Constipation:  - Encouraged fiber intake and hydration  - Pt under the impression that this may be a side effect from levothyroxine, I did explain to the patient that constipation typically occurs with uncontrolled hypothyroidism, but she is also on multiple medications that could cause constipation as a side effect    F/u in 6 months   Signed electronically by: Mack Guise, MD  [facility]  [facility] [address] [place], [place] [address] Phone: [phone] FAX: [phone]      CC: Hoyt Koch, [place] [address] Phone: [phone]  Fax: [phone]   Return to Endocrinology clinic as below: Future Appointments  Date Time Provider [place]  03/10/2022  9:30 AM [place], Melanie Crazier, MD [facility] None  03/13/2022  2:00 PM [place] [facility] None  04/24/2022  9:20 AM Hoyt Koch, MD [facility] None

## 2022-03-10 NOTE — Patient Instructions (Signed)

## 2022-03-10 NOTE — Telephone Encounter (Signed)
Please let the patient know that her thyroid test continues to show that she is not on enough levothyroxine which could be the reason for her constipation   Please asked the patient to stop levothyroxine 75 mcg and start levothyroxine 88 mcg daily   Thanks

## 2022-03-10 NOTE — Telephone Encounter (Signed)
Attempted to contact patient no answer and no vm

## 2022-03-11 NOTE — Telephone Encounter (Signed)
Attempted to contact patient no answer and no vm

## 2022-03-12 NOTE — Telephone Encounter (Signed)
Patient advised and will pick up new dose of medication

## 2022-03-13 ENCOUNTER — Ambulatory Visit (INDEPENDENT_AMBULATORY_CARE_PROVIDER_SITE_OTHER): Payer: 59

## 2022-03-13 ENCOUNTER — Other Ambulatory Visit: Payer: Self-pay | Admitting: Internal Medicine

## 2022-03-13 VITALS — Ht 63.0 in | Wt 205.0 lb

## 2022-03-13 DIAGNOSIS — Z0000 Encounter for general adult medical examination without abnormal findings: Secondary | ICD-10-CM | POA: Diagnosis not present

## 2022-03-13 DIAGNOSIS — E1169 Type 2 diabetes mellitus with other specified complication: Secondary | ICD-10-CM

## 2022-03-13 NOTE — Patient Instructions (Addendum)
Lindsay [name] , Thank you for taking time to come for your Medicare Wellness Visit. I appreciate your ongoing commitment to your health goals. Please review the following plan we discussed and let me know if I can assist you in the future.   These are the goals we discussed:  Goals      My goal for 2024 is to maintain my health.        This is a list of the screening recommended for you and due dates:  Health Maintenance  Topic Date Due   COVID-19 Vaccine (6 - 2023-24 season) 11/19/2021   Yearly kidney health urinalysis for diabetes  01/30/2022   Hemoglobin A1C  04/23/2022   Yearly kidney function blood test for diabetes  07/20/2022   Complete foot exam   10/23/2022   Eye exam for diabetics  12/10/2022   Mammogram  12/18/2022   Medicare Annual Wellness Visit  03/13/2023   Cologuard (Stool DNA test)  11/04/2024   DTaP/Tdap/Td vaccine (2 - Tdap) 02/09/2025   Pneumonia Vaccine  Completed   Flu Shot  Completed   DEXA scan (bone density measurement)  Completed   Hepatitis C Screening: USPSTF Recommendation to screen - Ages 44-79 yo.  Completed   Zoster (Shingles) Vaccine  Completed   HPV Vaccine  Aged Out   Immunization History  Administered Date(s) Administered   DTaP 02/10/2015   Fluad Quad(high Dose 65+) 09/09/2018, 10/18/2020, 09/24/2021   Influenza, High Dose Seasonal PF 03/11/2018   Moderna SARS-COV2 Booster Vaccination 04/27/2020   Moderna Sars-Covid-2 Vaccination 02/18/2019, 03/18/2019, 11/25/2019   PFIZER Comirnaty(Gray Top)Covid-19 Tri-Sucrose Vaccine 09/24/2021   Pneumococcal Conjugate-13 10/12/2014   Pneumococcal Polysaccharide-23 11/04/2016   RSV,unspecified 09/24/2021   Zoster Recombinat (Shingrix) 08/21/2016, 11/03/2016    Advanced directives: No; Advance directive discussed with you today. Even though you declined this today please call our office should you change your mind and we can give you the proper paperwork for you to fill out.  Conditions/risks  identified: Yes  Next appointment: Follow up in one year for your annual wellness visit.   Preventive Care 61 Years and Older, Female Preventive care refers to lifestyle choices and visits with your health care provider that can promote health and wellness. What does preventive care include? A yearly physical exam. This is also called an annual well check. Dental exams once or twice a year. Routine eye exams. Ask your health care provider how often you should have your eyes checked. Personal lifestyle choices, including: Daily care of your teeth and gums. Regular physical activity. Eating a healthy diet. Avoiding tobacco and drug use. Limiting alcohol use. Practicing safe sex. Taking low-dose aspirin every day. Taking vitamin and mineral supplements as recommended by your health care provider. What happens during an annual well check? The services and screenings done by your health care provider during your annual well check will depend on your age, overall health, lifestyle risk factors, and family history of disease. Counseling  Your health care provider may ask you questions about your: Alcohol use. Tobacco use. Drug use. Emotional well-being. Home and relationship well-being. Sexual activity. Eating habits. History of falls. Memory and ability to understand (cognition). Work and work Statistician. Reproductive health. Screening  You may have the following tests or measurements: Height, weight, and BMI. Blood pressure. Lipid and cholesterol levels. These may be checked every 5 years, or more frequently if you are over 69 years old. Skin check. Lung cancer screening. You may have this screening every year starting  at age 28 if you have a 30-pack-year history of smoking and currently smoke or have quit within the past 15 years. Fecal occult blood test (FOBT) of the stool. You may have this test every year starting at age 14. Flexible sigmoidoscopy or colonoscopy. You may  have a sigmoidoscopy every 5 years or a colonoscopy every 10 years starting at age 11. Hepatitis C blood test. Hepatitis B blood test. Sexually transmitted disease (STD) testing. Diabetes screening. This is done by checking your blood sugar (glucose) after you have not eaten for a while (fasting). You may have this done every 1-3 years. Bone density scan. This is done to screen for osteoporosis. You may have this done starting at age 66. Mammogram. This may be done every 1-2 years. Talk to your health care provider about how often you should have regular mammograms. Talk with your health care provider about your test results, treatment options, and if necessary, the need for more tests. Vaccines  Your health care provider may recommend certain vaccines, such as: Influenza vaccine. This is recommended every year. Tetanus, diphtheria, and acellular pertussis (Tdap, Td) vaccine. You may need a Td booster every 10 years. Zoster vaccine. You may need this after age 70. Pneumococcal 13-valent conjugate (PCV13) vaccine. One dose is recommended after age 14. Pneumococcal polysaccharide (PPSV23) vaccine. One dose is recommended after age 37. Talk to your health care provider about which screenings and vaccines you need and how often you need them. This information is not intended to replace advice given to you by your health care provider. Make sure you discuss any questions you have with your health care provider. Document Released: 01/19/2015 Document Revised: 09/12/2015 Document Reviewed: 10/24/2014 Elsevier Interactive Patient Education  2017 [place] Prevention in the Home Falls can cause injuries. They can happen to people of all ages. There are many things you can do to make your home safe and to help prevent falls. What can I do on the outside of my home? Regularly fix the edges of walkways and driveways and fix any cracks. Remove anything that might make you trip as you walk  through a door, such as a raised step or threshold. Trim any bushes or trees on the path to your home. Use bright outdoor lighting. Clear any walking paths of anything that might make someone trip, such as rocks or tools. Regularly check to see if handrails are loose or broken. Make sure that both sides of any steps have handrails. Any raised decks and porches should have guardrails on the edges. Have any leaves, snow, or ice cleared regularly. Use sand or salt on walking paths during winter. Clean up any spills in your garage right away. This includes oil or grease spills. What can I do in the bathroom? Use night lights. Install grab bars by the toilet and in the tub and shower. Do not use towel bars as grab bars. Use non-skid mats or decals in the tub or shower. If you need to sit down in the shower, use a plastic, non-slip stool. Keep the floor dry. Clean up any water that spills on the floor as soon as it happens. Remove soap buildup in the tub or shower regularly. Attach bath mats securely with double-sided non-slip rug tape. Do not have throw rugs and other things on the floor that can make you trip. What can I do in the bedroom? Use night lights. Make sure that you have a light by your bed that is easy  to reach. Do not use any sheets or blankets that are too big for your bed. They should not hang down onto the floor. Have a firm chair that has side arms. You can use this for support while you get dressed. Do not have throw rugs and other things on the floor that can make you trip. What can I do in the kitchen? Clean up any spills right away. Avoid walking on wet floors. Keep items that you use a lot in easy-to-reach places. If you need to reach something above you, use a strong step stool that has a grab bar. Keep electrical cords out of the way. Do not use floor polish or wax that makes floors slippery. If you must use wax, use non-skid floor wax. Do not have throw rugs and  other things on the floor that can make you trip. What can I do with my stairs? Do not leave any items on the stairs. Make sure that there are handrails on both sides of the stairs and use them. Fix handrails that are broken or loose. Make sure that handrails are as long as the stairways. Check any carpeting to make sure that it is firmly attached to the stairs. Fix any carpet that is loose or worn. Avoid having throw rugs at the top or bottom of the stairs. If you do have throw rugs, attach them to the floor with carpet tape. Make sure that you have a light switch at the top of the stairs and the bottom of the stairs. If you do not have them, ask someone to add them for you. What else can I do to help prevent falls? Wear shoes that: Do not have high heels. Have rubber bottoms. Are comfortable and fit you well. Are closed at the toe. Do not wear sandals. If you use a stepladder: Make sure that it is fully opened. Do not climb a closed stepladder. Make sure that both sides of the stepladder are locked into place. Ask someone to hold it for you, if possible. Clearly mark and make sure that you can see: Any grab bars or handrails. First and last steps. Where the edge of each step is. Use tools that help you move around (mobility aids) if they are needed. These include: Canes. Walkers. Scooters. Crutches. Turn on the lights when you go into a dark area. Replace any light bulbs as soon as they burn out. Set up your furniture so you have a clear path. Avoid moving your furniture around. If any of your floors are uneven, fix them. If there are any pets around you, be aware of where they are. Review your medicines with your doctor. Some medicines can make you feel dizzy. This can increase your chance of falling. Ask your doctor what other things that you can do to help prevent falls. This information is not intended to replace advice given to you by your health care provider. Make sure you  discuss any questions you have with your health care provider. Document Released: 10/19/2008 Document Revised: 05/31/2015 Document Reviewed: 01/27/2014 Elsevier Interactive Patient Education  2017 [employer].

## 2022-03-13 NOTE — Progress Notes (Signed)
I connected with  Carmie End on 03/13/22 by a audio enabled telemedicine application and verified that I am speaking with the correct person using two identifiers.  Patient Location: Home  Provider Location: Office/Clinic  I discussed the limitations of evaluation and management by telemedicine. The patient expressed understanding and agreed to proceed.  Subjective:   ALYZEA RADON is a 77 y.o. female who presents for Medicare Annual (Subsequent) preventive examination.  Review of Systems     Cardiac Risk Factors include: advanced age (>72mn, >>79women);diabetes mellitus;hypertension;dyslipidemia;obesity (BMI >30kg/m2);sedentary lifestyle;family history of premature cardiovascular disease     Objective:    Today's Vitals   03/13/22 1407  Weight: 205 lb (93 kg)  Height: '5\' 3"'$  (1.6 m)  PainSc: 0-No pain  PainLoc: Chest   Body mass index is 36.31 kg/m.     03/13/2022    2:14 PM 01/28/2021    2:00 PM 09/30/2019    5:43 AM 09/21/2019    9:17 AM 09/02/2019    9:52 AM 07/28/2019    8:59 AM 07/26/2019    1:13 AM  Advanced Directives  Does Patient Have a Medical Advance Directive? No No No No No Yes No  Does patient want to make changes to medical advance directive?      No - Patient declined   Would patient like information on creating a medical advance directive? No - Patient declined No - Patient declined No - Patient declined  No - Patient declined      Current Medications (verified) Outpatient Encounter Medications as of 03/13/2022  Medication Sig   albuterol (VENTOLIN HFA) 108 (90 Base) MCG/ACT inhaler INHALE 2 PUFFS INTO THE LUNGS EVERY 6 HOURS AS NEEDED FOR WHEEZING OR SHORTNESS OF BREATH   blood glucose meter kit and supplies KIT Check blood sugar with glucometer every morning before your first meal   budesonide-formoterol (SYMBICORT) 80-4.5 MCG/ACT inhaler INHALE 2 PUFFS INTO THE LUNGS TWICE DAILY   cetirizine (ZYRTEC) 10 MG tablet Take 1 tablet (10 mg total) by  mouth daily. FOR ASTHMA (Patient taking differently: Take 10 mg by mouth daily.)   Cholecalciferol (VITAMIN D3) 50 MCG (2000 UT) capsule Take 1 capsule (2,000 Units total) by mouth daily. Must keep scheduled follow=-up appt for future refills   cyclobenzaprine (FLEXERIL) 10 MG tablet Take by mouth.   famotidine (PEPCID) 20 MG tablet One after supper   gabapentin (NEURONTIN) 300 MG capsule Take 300 mg by mouth 3 (three) times daily.   hydrOXYzine (VISTARIL) 25 MG capsule TAKE 1 CAPSULE(25 MG) BY MOUTH EVERY 8 HOURS AS NEEDED   levothyroxine (SYNTHROID) 88 MCG tablet Take 1 tablet (88 mcg total) by mouth daily.   losartan (COZAAR) 50 MG tablet TAKE 1 TABLET(50 MG) BY MOUTH DAILY   meloxicam (MOBIC) 7.5 MG tablet TAKE 1 TABLET(7.5 MG) BY MOUTH DAILY   Menthol-Methyl Salicylate (SALONPAS PAIN RELIEF PATCH EX) Place 1 patch onto the skin daily as needed (pain).   montelukast (SINGULAIR) 10 MG tablet TAKE 1 TABLET(10 MG) BY MOUTH AT BEDTIME   NON FORMULARY DIET- REGULAR CCD   oxybutynin (DITROPAN) 5 MG tablet Take 1 tablet (5 mg total) by mouth 2 (two) times daily. (Patient taking differently: Take 10 mg by mouth daily.)   pantoprazole (PROTONIX) 40 MG tablet TAKE 1 TABLET(40 MG) BY MOUTH DAILY 30 TO 60 MINUTES BEFORE FIRST MEAL OF THE DAY   PFIZER COVID-19 VAC BIVALENT injection    RYBELSUS 7 MG TABS TAKE 1 TABLET BY MOUTH DAILY  simvastatin (ZOCOR) 40 MG tablet TAKE 1 TABLET(40 MG) BY MOUTH DAILY AT 6 PM   tiZANidine (ZANAFLEX) 4 MG tablet Take 0.5-1 tablets (2-4 mg total) by mouth every 8 (eight) hours as needed for muscle spasms.   No facility-administered encounter medications on file as of 03/13/2022.    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   Procedure: TOTAL KNEE ARTHROPLASTY;  Surgeon: Dorna Leitz, MD;  Location: [facility];  Service: Orthopedics;  Laterality: Right;   Family History  Problem Relation Age of Onset   Microcephaly Maternal Grandmother    Heart disease Maternal Grandmother        Not clear details, patient just knows she took "heart pills"   Asthma Daughter    Social History   Socioeconomic History   Marital status: Single    Spouse name: Not on file   Number of children: 1   Years of education: 12   Highest education level: Not on file  Occupational History   Not on file  Tobacco Use   Smoking status: Former    Packs/day: 0.50    Years: 20.00    Total pack years: 10.00    Types: Cigarettes    Quit date: 01/06/1997    Years since quitting: 25.1   Smokeless tobacco: Never  Vaping Use   Vaping Use: Never used  Substance and Sexual Activity   Alcohol use: No   Drug use: No   Sexual activity: Not on file  Other Topics Concern   Not on file  Social History Narrative   Not on file   Social Determinants of Health   Financial Resource Strain: Low Risk  (03/13/2022)   Overall Financial Resource Strain (CARDIA)    Difficulty of Paying Living Expenses: Not hard at all  Food Insecurity: No Food Insecurity (03/13/2022)   Hunger Vital Sign    Worried About Running Out of Food in the Last Year: Never true    Ran Out of Food in the Last Year: Never true  Transportation Needs: No Transportation Needs (03/13/2022)   PRAPARE - Hydrologist (Medical): No    Lack of Transportation (Non-Medical): No  Physical Activity: Inactive  (03/13/2022)   Exercise Vital Sign    Days of Exercise per Week: 0 days    Minutes of Exercise per Session: 0 min  Stress: No Stress Concern Present (03/13/2022)   [place]    Feeling of Stress : Only a little  Social Connections: Moderately Isolated (03/13/2022)   Social Connection and Isolation Panel [NHANES]    Frequency of Communication with Friends and Family: Twice a week    Frequency of Social Gatherings with Friends and Family: Twice a week  Attends Religious Services: More than 4 times per year    Active Member of Clubs or Organizations: No    Attends Archivist Meetings: Never    Marital Status: Never married    Tobacco Counseling Counseling given: Not Answered   Clinical Intake:  Pre-visit preparation completed: Yes  Pain : No/denies pain Pain Score: 0-No pain (no pain at this moment; only at night)     BMI - recorded: 36.31 Nutritional Risks: None Diabetes: No  How often do you need to have someone help you when you read instructions, pamphlets, or other written materials from your doctor or pharmacy?: 1 - Never What is the last grade level you completed in school?: HSG  Nutrition Risk Assessment:  Has the patient had any N/V/D within the last 2 months?  No  Does the patient have any non-healing wounds?  No  Has the patient had any unintentional weight loss or weight gain?  No   Diabetes:  Is the patient diabetic?  Yes  If diabetic, was a CBG obtained today?  No  Did the patient bring in their glucometer from home?  No  How often do you monitor your CBG's? No.   Financial Strains and Diabetes Management:  Are you having any financial strains with the device, your supplies or your medication? No .  Does the patient want to be seen by Chronic Care Management for management of their diabetes?  No  Would the patient like to be referred to a Nutritionist or for Diabetic Management?   No   Diabetic Exams:  Diabetic Eye Exam: Completed 12/09/2021 Diabetic Foot Exam: Completed 10/22/2021   Interpreter Needed?: No  Information entered by :: Lisette Abu, LPN.   Activities of Daily Living    03/13/2022    2:25 PM  In your present state of health, do you have any difficulty performing the following activities:  Hearing? 0  Vision? 0  Difficulty concentrating or making decisions? 0  Walking or climbing stairs? 1  Dressing or bathing? 0  Doing errands, shopping? 0  Preparing Food and eating ? N  Using the Toilet? N  In the past six months, have you accidently leaked urine? Y  Do you have problems with loss of bowel control? N  Managing your Medications? N  Managing your Finances? N  Housekeeping or managing your Housekeeping? N    Patient Care Team: Hoyt Koch, MD as PCP - General (Internal Medicine)  Indicate any recent Medical Services you may have received from other than [facility] providers in the past year (date may be approximate).     Assessment:   This is a routine wellness examination for Railyn.  Hearing/Vision screen Hearing Screening - Comments:: Denies hearing difficulties   Vision Screening - Comments:: Wears rx glasses - up to date with routine eye exams with [facility]   Dietary issues and exercise activities discussed: Current Exercise Habits: The patient does not participate in regular exercise at present, Exercise limited by: orthopedic condition(s);respiratory conditions(s)   Goals Addressed             This Visit's Progress    My goal for 2024 is to maintain my health.        Depression Screen    03/13/2022    2:23 PM 10/22/2021    9:32 AM 07/19/2021    3:20 PM 05/01/2021   10:07 AM 01/28/2021    2:07 PM 01/28/2021    1:57 PM 05/31/2020   10:01  AM  PHQ 2/9 Scores  PHQ - 2 Score 3 3 0 4 0 0 3  PHQ- 9 Score 11 11 0 18   9    Fall Risk    03/13/2022    2:12 PM 10/22/2021    9:32 AM 07/19/2021    3:20 PM  05/01/2021   10:08 AM 01/28/2021    2:06 PM  Fall Risk   Falls in the past year? 0 0 0  0  Number falls in past yr: 0 0 0 0   Injury with Fall? 0 0 0 0 0  Risk for fall due to : No Fall Risks No Fall Risks     Risk for fall due to: Comment     uses cane  Follow up Falls prevention discussed Falls evaluation completed   Falls evaluation completed    [place]:  Any stairs in or around the home? No  If so, are there any without handrails? No  Home free of loose throw rugs in walkways, pet beds, electrical cords, etc? Yes  Adequate lighting in your home to reduce risk of falls? Yes   ASSISTIVE DEVICES UTILIZED TO PREVENT FALLS:  Life alert? Yes  Use of a cane, walker or w/c? Yes  Grab bars in the bathroom? Yes  Shower chair or bench in shower? Yes  Elevated toilet seat or a handicapped toilet? Yes   TIMED UP AND GO:  Was the test performed? No . Telephonic Visit  Cognitive Function:        03/13/2022    2:13 PM  6CIT Screen  What Year? 0 points  What month? 0 points  What time? 0 points  Count back from 20 0 points  Months in reverse 0 points  Repeat phrase 0 points  Total Score 0 points    Immunizations Immunization History  Administered Date(s) Administered   DTaP 02/10/2015   Fluad Quad(high Dose 65+) 09/09/2018, 10/18/2020, 09/24/2021   Influenza, High Dose Seasonal PF 03/11/2018   Moderna SARS-COV2 Booster Vaccination 04/27/2020   Moderna Sars-Covid-2 Vaccination 02/18/2019, 03/18/2019, 11/25/2019   PFIZER Comirnaty(Gray Top)Covid-19 Tri-Sucrose Vaccine 09/24/2021   Pneumococcal Conjugate-13 10/12/2014   Pneumococcal Polysaccharide-23 11/04/2016   RSV,unspecified 09/24/2021   Zoster Recombinat (Shingrix) 08/21/2016, 11/03/2016    TDAP status: Up to date  Flu Vaccine status: Up to date  Pneumococcal vaccine status: Up to date  Covid-19 vaccine status: Completed vaccines  Qualifies for Shingles Vaccine? Yes    Zostavax completed Yes   Shingrix Completed?: Yes  Screening Tests Health Maintenance  Topic Date Due   COVID-19 Vaccine (6 - 2023-24 season) 11/19/2021   Diabetic kidney evaluation - Urine ACR  01/30/2022   HEMOGLOBIN A1C  04/23/2022   Diabetic kidney evaluation - eGFR measurement  07/20/2022   FOOT EXAM  10/23/2022   OPHTHALMOLOGY EXAM  12/10/2022   MAMMOGRAM  12/18/2022   Medicare Annual Wellness (AWV)  03/13/2023   Fecal DNA (Cologuard)  11/04/2024   DTaP/Tdap/Td (2 - Tdap) 02/09/2025   Pneumonia Vaccine 54+ Years old  Completed   INFLUENZA VACCINE  Completed   DEXA SCAN  Completed   Hepatitis C Screening  Completed   Zoster Vaccines- Shingrix  Completed   HPV VACCINES  Aged Out    Health Maintenance  Health Maintenance Due  Topic Date Due   COVID-19 Vaccine (6 - 2023-24 season) 11/19/2021   Diabetic kidney evaluation - Urine ACR  01/30/2022    Colorectal cancer screening: Type  of screening: Cologuard. Completed 11/12/2021. Repeat every 3 years  Mammogram status: Completed 12/17/2021. Repeat every year  Bone Density status: Completed 07/02/2021. Results reflect: Bone density results: OSTEOPENIA. Repeat every 3-5 years.  Lung Cancer Screening: (Low Dose CT Chest recommended if Age 97-80 years, 30 pack-year currently smoking OR have quit w/in 15years.) does not qualify.   Lung Cancer Screening Referral: no  Additional Screening:  Hepatitis C Screening: does qualify; Completed 02/11/2018  Vision Screening: Recommended annual ophthalmology exams for early detection of glaucoma and other disorders of the eye. Is the patient up to date with their annual eye exam?  Yes  Who is the provider or what is the name of the office in which the patient attends annual eye exams? [facility] If pt is not established with a provider, would they like to be referred to a provider to establish care? No .   Dental Screening: Recommended annual dental exams for proper oral  hygiene  Community Resource Referral / Chronic Care Management: CRR required this visit?  No   CCM required this visit?  No      Plan:     I have personally reviewed and noted the following in the patient's chart:   Medical and social history Use of alcohol, tobacco or illicit drugs  Current medications and supplements including opioid prescriptions. Patient is not currently taking opioid prescriptions. Functional ability and status Nutritional status Physical activity Advanced directives List of other physicians Hospitalizations, surgeries, and ER visits in previous 12 months Vitals Screenings to include cognitive, depression, and falls Referrals and appointments  In addition, I have reviewed and discussed with patient certain preventive protocols, quality metrics, and best practice recommendations. A written personalized care plan for preventive services as well as general preventive health recommendations were provided to patient.     Sheral Flow, LPN   [id number]   Nurse Notes: Normal cognitive status assessed by direct observation by this Nurse Health Advisor. No abnormalities found.

## 2022-03-19 ENCOUNTER — Other Ambulatory Visit: Payer: Self-pay | Admitting: Internal Medicine

## 2022-03-19 DIAGNOSIS — I10 Essential (primary) hypertension: Secondary | ICD-10-CM

## 2022-04-24 ENCOUNTER — Ambulatory Visit (INDEPENDENT_AMBULATORY_CARE_PROVIDER_SITE_OTHER): Payer: 59 | Admitting: Internal Medicine

## 2022-04-24 ENCOUNTER — Encounter: Payer: Self-pay | Admitting: Internal Medicine

## 2022-04-24 VITALS — BP 122/60 | HR 68 | Temp 98.3°F | Ht 63.0 in | Wt 203.4 lb

## 2022-04-24 DIAGNOSIS — E785 Hyperlipidemia, unspecified: Secondary | ICD-10-CM | POA: Diagnosis not present

## 2022-04-24 DIAGNOSIS — E039 Hypothyroidism, unspecified: Secondary | ICD-10-CM

## 2022-04-24 DIAGNOSIS — I10 Essential (primary) hypertension: Secondary | ICD-10-CM | POA: Diagnosis not present

## 2022-04-24 DIAGNOSIS — E118 Type 2 diabetes mellitus with unspecified complications: Secondary | ICD-10-CM | POA: Diagnosis not present

## 2022-04-24 DIAGNOSIS — J42 Unspecified chronic bronchitis: Secondary | ICD-10-CM

## 2022-04-24 DIAGNOSIS — E1169 Type 2 diabetes mellitus with other specified complication: Secondary | ICD-10-CM

## 2022-04-24 LAB — COMPREHENSIVE METABOLIC PANEL WITH GFR
ALT: 11 U/L (ref 0–35)
AST: 16 U/L (ref 0–37)
Albumin: 4.3 g/dL (ref 3.5–5.2)
Alkaline Phosphatase: 50 U/L (ref 39–117)
BUN: 20 mg/dL (ref 6–23)
CO2: 30 meq/L (ref 19–32)
Calcium: 10.1 mg/dL (ref 8.4–10.5)
Chloride: 105 meq/L (ref 96–112)
Creatinine, Ser: 0.82 mg/dL (ref 0.40–1.20)
GFR: 70.27 mL/min
Glucose, Bld: 84 mg/dL (ref 70–99)
Potassium: 4.2 meq/L (ref 3.5–5.1)
Sodium: 141 meq/L (ref 135–145)
Total Bilirubin: 0.3 mg/dL (ref 0.2–1.2)
Total Protein: 7.6 g/dL (ref 6.0–8.3)

## 2022-04-24 LAB — CBC
HCT: 35.1 % — ABNORMAL LOW (ref 36.0–46.0)
Hemoglobin: 11.5 g/dL — ABNORMAL LOW (ref 12.0–15.0)
MCHC: 32.6 g/dL (ref 30.0–36.0)
MCV: 82 fl (ref 78.0–100.0)
Platelets: 344 10*3/uL (ref 150.0–400.0)
RBC: 4.29 Mil/uL (ref 3.87–5.11)
RDW: 14.9 % (ref 11.5–15.5)
WBC: 7.7 10*3/uL (ref 4.0–10.5)

## 2022-04-24 LAB — LIPID PANEL
Cholesterol: 149 mg/dL (ref 0–200)
HDL: 45.5 mg/dL
LDL Cholesterol: 96 mg/dL (ref 0–99)
NonHDL: 103.43
Total CHOL/HDL Ratio: 3
Triglycerides: 39 mg/dL (ref 0.0–149.0)
VLDL: 7.8 mg/dL (ref 0.0–40.0)

## 2022-04-24 LAB — MICROALBUMIN / CREATININE URINE RATIO
Creatinine,U: 73.9 mg/dL
Microalb Creat Ratio: 2.8 mg/g (ref 0.0–30.0)
Microalb, Ur: 2.1 mg/dL — ABNORMAL HIGH (ref 0.0–1.9)

## 2022-04-24 LAB — HEMOGLOBIN A1C: Hgb A1c MFr Bld: 6.1 % (ref 4.6–6.5)

## 2022-04-24 MED ORDER — HYDROXYZINE PAMOATE 25 MG PO CAPS: 25.0000 mg | ORAL_CAPSULE | Freq: Two times a day (BID) | ORAL | 2 refills | Status: DC | PRN

## 2022-04-24 NOTE — Assessment & Plan Note (Signed)
Checking HgA1c and microalbumin to creatinine ratio. Adjust as needed her rybelsus 7 mg daily and continue ARB and statin. Checking lipid panel. Eye exam up to date.

## 2022-04-24 NOTE — Assessment & Plan Note (Signed)
BMI 36 and complicated by diabetes and hyperlipidemia and hypertension. Counseled about diet and exercise.

## 2022-04-24 NOTE — Progress Notes (Signed)
   Subjective:   Patient ID: Lindsay [name], female    DOB: 05-19-1945, 77 y.o.   MRN: [id number]  HPI The patient is a 77 YO female coming in for follow up.  Review of Systems  Constitutional: Negative.   HENT: Negative.    Eyes: Negative.   Respiratory:  Negative for cough, chest tightness and shortness of breath.   Cardiovascular:  Negative for chest pain, palpitations and leg swelling.  Gastrointestinal:  Negative for abdominal distention, abdominal pain, constipation, diarrhea, nausea and vomiting.  Musculoskeletal: Negative.   Skin: Negative.   Neurological: Negative.   Psychiatric/Behavioral: Negative.      Objective:  Physical Exam Constitutional:      Appearance: She is well-developed.  HENT:     Head: Normocephalic and atraumatic.  Cardiovascular:     Rate and Rhythm: Normal rate and regular rhythm.  Pulmonary:     Effort: Pulmonary effort is normal. No respiratory distress.     Breath sounds: Wheezing present. No rales.  Abdominal:     General: Bowel sounds are normal. There is no distension.     Palpations: Abdomen is soft.     Tenderness: There is no abdominal tenderness. There is no rebound.  Musculoskeletal:     Cervical back: Normal range of motion.  Skin:    General: Skin is warm and dry.  Neurological:     Mental Status: She is alert and oriented to person, place, and time.     Coordination: Coordination normal.     Vitals:   04/24/22 0923  BP: 122/60  Pulse: 68  Temp: 98.3 F (36.8 C)  TempSrc: Oral  SpO2: 98%  Weight: 203 lb 6 oz (92.3 kg)  Height:  (1.6 m)   EKG: Rate 62, axis normal, interval normal, sinus, no st or t wave changes, no significant change compared to prior 2021   Assessment & Plan:

## 2022-04-24 NOTE — Patient Instructions (Signed)
We will check the labs today. 

## 2022-04-24 NOTE — Assessment & Plan Note (Signed)
Checking lipid panel and adjust simvastatin 40 mg daily for LDL goal <70.

## 2022-04-24 NOTE — Assessment & Plan Note (Signed)
BP at goal. Checking CBC and CMP. Continue losartan 50 mg daily. Adjust as needed.

## 2022-04-24 NOTE — Assessment & Plan Note (Signed)
Mild wheezing in the base left lung. Some pollen. Continue zyrtec and albuterol as needed. No increase in SOB.

## 2022-04-24 NOTE — Assessment & Plan Note (Signed)
Recent change of meds by endo and needs TSH and free T4 end of month which endo has already ordered. She understands timeline to get this done.

## 2022-05-06 ENCOUNTER — Other Ambulatory Visit (INDEPENDENT_AMBULATORY_CARE_PROVIDER_SITE_OTHER): Payer: 59

## 2022-05-06 DIAGNOSIS — E039 Hypothyroidism, unspecified: Secondary | ICD-10-CM

## 2022-05-06 LAB — TSH: TSH: 2.71 u[IU]/mL (ref 0.35–5.50)

## 2022-05-07 ENCOUNTER — Encounter: Payer: Self-pay | Admitting: Internal Medicine

## 2022-05-08 ENCOUNTER — Encounter: Payer: Self-pay | Admitting: Internal Medicine

## 2022-06-04 ENCOUNTER — Other Ambulatory Visit: Payer: Self-pay | Admitting: Internal Medicine

## 2022-07-15 ENCOUNTER — Encounter: Payer: Self-pay | Admitting: Emergency Medicine

## 2022-07-15 ENCOUNTER — Ambulatory Visit (INDEPENDENT_AMBULATORY_CARE_PROVIDER_SITE_OTHER): Payer: 59 | Admitting: Emergency Medicine

## 2022-07-15 VITALS — BP 136/84 | HR 75 | Temp 101.1°F | Ht 63.0 in | Wt 204.1 lb

## 2022-07-15 DIAGNOSIS — J22 Unspecified acute lower respiratory infection: Secondary | ICD-10-CM | POA: Diagnosis not present

## 2022-07-15 DIAGNOSIS — R6889 Other general symptoms and signs: Secondary | ICD-10-CM | POA: Diagnosis not present

## 2022-07-15 DIAGNOSIS — R509 Fever, unspecified: Secondary | ICD-10-CM | POA: Insufficient documentation

## 2022-07-15 DIAGNOSIS — R051 Acute cough: Secondary | ICD-10-CM

## 2022-07-15 DIAGNOSIS — R053 Chronic cough: Secondary | ICD-10-CM | POA: Insufficient documentation

## 2022-07-15 LAB — POC COVID19 BINAXNOW: SARS Coronavirus 2 Ag: NEGATIVE

## 2022-07-15 MED ORDER — PREDNISONE 20 MG PO TABS: 40.0000 mg | ORAL_TABLET | Freq: Every day | ORAL | 0 refills | Status: AC

## 2022-07-15 MED ORDER — AZITHROMYCIN 250 MG PO TABS: ORAL_TABLET | ORAL | 0 refills | Status: DC

## 2022-07-15 MED ORDER — BENZONATATE 200 MG PO CAPS: 200.0000 mg | ORAL_CAPSULE | Freq: Two times a day (BID) | ORAL | 0 refills | Status: DC | PRN

## 2022-07-15 MED ORDER — MONTELUKAST SODIUM 10 MG PO TABS: ORAL_TABLET | ORAL | 5 refills | Status: DC

## 2022-07-15 NOTE — Assessment & Plan Note (Signed)
Advised to rest and stay well-hydrated Tylenol for fever and aches

## 2022-07-15 NOTE — Progress Notes (Signed)
Lindsay [name] 77 y.o.   Chief Complaint  Patient presents with   Headache    Patient states she doesn't feel good, feverish, body aches, patient states she is blowing blood clots out her nose.     HISTORY OF PRESENT ILLNESS: Acute problem visit today.  Patient of Dr. Hillard Danker This is a 77 y.o. female complaining of flulike symptoms that started last Sunday, got worse on Monday.  Complaining of fever, headache, productive cough and blowing up blood clots out of her nose.  History of asthma No other associated symptoms No other complaints or medical concerns today.  Headache  Associated symptoms include coughing. Pertinent negatives include no abdominal pain, fever, nausea or vomiting.     Prior to Admission medications   Medication Sig Start Date End Date Taking? Authorizing Provider  albuterol (VENTOLIN HFA) 108 (90 Base) MCG/ACT inhaler INHALE 2 PUFFS INTO THE LUNGS EVERY 6 HOURS AS NEEDED FOR WHEEZING OR SHORTNESS OF BREATH 06/10/21  Yes Myrlene Broker, MD  blood glucose meter kit and supplies KIT Check blood sugar with glucometer every morning before your first meal 12/27/21  Yes Elenore Paddy, NP  budesonide-formoterol (SYMBICORT) 80-4.5 MCG/ACT inhaler INHALE 2 PUFFS INTO THE LUNGS TWICE DAILY 06/10/21  Yes Myrlene Broker, MD  cetirizine (ZYRTEC) 10 MG tablet Take 1 tablet (10 mg total) by mouth daily. FOR ASTHMA Patient taking differently: Take 10 mg by mouth daily. 09/05/19  Yes Glenford Bayley, NP  Cholecalciferol (VITAMIN D3) 50 MCG (2000 UT) capsule Take 1 capsule (2,000 Units total) by mouth daily. Must keep scheduled follow=-up appt for future refills 08/06/19  Yes Medina-Vargas, Monina C, NP  cyclobenzaprine (FLEXERIL) 10 MG tablet Take by mouth. 07/18/21  Yes [provider]  famotidine (PEPCID) 20 MG tablet One after supper 03/09/20  Yes Nyoka Cowden, MD  gabapentin (NEURONTIN) 300 MG capsule Take 300 mg by mouth 3 (three) times daily.    Yes [provider]  hydrOXYzine (VISTARIL) 25 MG capsule Take 1 capsule (25 mg total) by mouth 2 (two) times daily as needed. 04/24/22  Yes Myrlene Broker, MD  levothyroxine (SYNTHROID) 88 MCG tablet Take 1 tablet (88 mcg total) by mouth daily. 03/10/22  Yes Shamleffer, Konrad Dolores, MD  losartan (COZAAR) 50 MG tablet TAKE 1 TABLET(50 MG) BY MOUTH DAILY 03/19/22  Yes Myrlene Broker, MD  meloxicam (MOBIC) 7.5 MG tablet TAKE 1 TABLET(7.5 MG) BY MOUTH DAILY 06/04/22  Yes Myrlene Broker, MD  Menthol-Methyl Salicylate (SALONPAS PAIN RELIEF PATCH EX) Place 1 patch onto the skin daily as needed (pain).   Yes [provider]  montelukast (SINGULAIR) 10 MG tablet TAKE 1 TABLET(10 MG) BY MOUTH AT BEDTIME 10/07/20  Yes Nyoka Cowden, MD  NON FORMULARY DIET- REGULAR CCD 07/27/19  Yes [provider]  oxybutynin (DITROPAN) 5 MG tablet Take 1 tablet (5 mg total) by mouth 2 (two) times daily. Patient taking differently: Take 10 mg by mouth daily. 08/06/19  Yes Medina-Vargas, Monina C, NP  pantoprazole (PROTONIX) 40 MG tablet TAKE 1 TABLET(40 MG) BY MOUTH DAILY 30 TO 60 MINUTES BEFORE FIRST MEAL OF THE DAY 08/08/21  Yes Myrlene Broker, MD  RYBELSUS 7 MG TABS TAKE 1 TABLET BY MOUTH DAILY 06/04/22  Yes Myrlene Broker, MD  simvastatin (ZOCOR) 40 MG tablet TAKE 1 TABLET(40 MG) BY MOUTH DAILY AT 6 PM 03/13/22  Yes Myrlene Broker, MD  tiZANidine (ZANAFLEX) 4 MG tablet Take 0.5-1 tablets (  2-4 mg total) by mouth every 8 (eight) hours as needed for muscle spasms. 06/18/21  Yes Myrlene Broker, MD    Allergies  Allergen Reactions   Codeine Nausea Only   Lisinopril Cough   Morphine And Codeine Itching   Shellfish Allergy Itching    Patient Active Problem List   Diagnosis Date Noted   Other fatigue 07/19/2021   Eczema 10/24/2020   Primary osteoarthritis of right knee 09/30/2019   Status post total knee replacement, right 09/30/2019   Routine  general medical examination at a health care facility 09/05/2019   Anemia, unspecified 07/28/2019   Hypothyroidism 01/12/2019   Ganglion of flexor tendon sheath of right middle finger 07/15/2018   Type 2 diabetes mellitus with complication (HCC) 02/11/2018   Essential hypertension 02/11/2018   Hyperlipidemia associated with type 2 diabetes mellitus (HCC) 02/11/2018   Morbid obesity (HCC) 05/17/2016   COPD (chronic obstructive pulmonary disease) (HCC)    Acid reflux    Urinary tract infection, chronic     Past Medical History:  Diagnosis Date   Acid reflux    Anemia    Arthritis    Asthma    COPD (chronic obstructive pulmonary disease) (HCC)    Diabetes mellitus without complication (HCC)    TYPE 2    Hyperglycemia    Hypertension    Hypokalemia    Hypothyroidism    Obesity    PONV (postoperative nausea and vomiting)    Spinal stenosis    Urinary tract infection, chronic     Past Surgical History:  Procedure Laterality Date   ABDOMINAL HYSTERECTOMY     ANKLE FUSION Left    CHOLECYSTECTOMY     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   Procedure: TOTAL KNEE ARTHROPLASTY;  Surgeon: Jodi Geralds, MD;  Location: [facility];  Service: Orthopedics;  Laterality: Right;    Social History   Socioeconomic History   Marital status: Single    Spouse name: Not on file   Number of children: 1   Years of education: 12   Highest education level: Not on file  Occupational History   Not on file  Tobacco Use   Smoking status: Former    Packs/day: 0.50    Years: 20.00    Additional pack years: 0.00    Total pack years: 10.00    Types: Cigarettes    Quit date: 01/06/1997    Years since quitting: 25.5   Smokeless tobacco: Never  Vaping Use   Vaping Use: Never used  Substance and Sexual Activity   Alcohol use: No   Drug use: No   Sexual activity: Not on file  Other Topics Concern   Not on file  Social History Narrative   Not on file   Social Determinants of Health    Financial Resource Strain: Low Risk  (03/13/2022)   Overall Financial Resource Strain (CARDIA)    Difficulty of Paying Living Expenses: Not hard at all  Food Insecurity: No Food Insecurity (03/13/2022)   Hunger Vital Sign    Worried About Running Out of Food in the Last Year: Never true    Ran Out of Food in the Last Year: Never true  Transportation Needs: No Transportation Needs (03/13/2022)   PRAPARE - Administrator, Civil Service (Medical): No    Lack of Transportation (Non-Medical): No  Physical Activity: Inactive (03/13/2022)   Exercise Vital Sign    Days of Exercise per Week: 0 days    Minutes of Exercise per  Session: 0 min  Stress: No Stress Concern Present (03/13/2022)   [employer] of Occupational Health - Occupational Stress Questionnaire    Feeling of Stress : Only a little  Social Connections: Moderately Isolated (03/13/2022)   Social Connection and Isolation Panel [NHANES]    Frequency of Communication with Friends and Family: Twice a week    Frequency of Social Gatherings with Friends and Family: Twice a week    Attends Religious Services: More than 4 times per year    Active Member of [employer] or Organizations: No    Attends Banker Meetings: Never    Marital Status: Never married  Intimate Partner Violence: Not At Risk (03/13/2022)   Humiliation, Afraid, Rape, and Kick questionnaire    Fear of Current or Ex-Partner: No    Emotionally Abused: No    Physically Abused: No    Sexually Abused: No    Family History  Problem Relation Age of Onset   Microcephaly Maternal Grandmother    Heart disease Maternal Grandmother        Not clear details, patient just knows she took "heart pills"   Asthma Daughter      Review of Systems  Constitutional: Negative.  Negative for chills and fever.  HENT:  Positive for congestion and nosebleeds.   Respiratory:  Positive for cough and sputum production. Negative for hemoptysis and shortness of breath.    Cardiovascular: Negative.  Negative for chest pain and palpitations.  Gastrointestinal:  Negative for abdominal pain, diarrhea, nausea and vomiting.  Genitourinary: Negative.  Negative for dysuria and hematuria.  Skin: Negative.  Negative for rash.  Neurological:  Positive for headaches.  All other systems reviewed and are negative.   Vitals:   07/15/22 1507  BP: 136/84  Pulse: 75  Temp: (!) 101.1 F (38.4 C)  SpO2: 98%    Physical Exam Vitals reviewed.  Constitutional:      Appearance: She is well-developed.  HENT:     Head: Normocephalic.     Nose: Congestion present.     Mouth/Throat:     Mouth: Mucous membranes are moist.     Pharynx: Oropharynx is clear.  Eyes:     Extraocular Movements: Extraocular movements intact.     Conjunctiva/sclera: Conjunctivae normal.     Pupils: Pupils are equal, round, and reactive to light.  Cardiovascular:     Rate and Rhythm: Normal rate and regular rhythm.     Pulses: Normal pulses.     Heart sounds: Normal heart sounds.  Pulmonary:     Effort: Pulmonary effort is normal.     Breath sounds: Normal breath sounds.  Musculoskeletal:     Cervical back: No tenderness.  Lymphadenopathy:     Cervical: No cervical adenopathy.  Skin:    General: Skin is warm and dry.     Capillary Refill: Capillary refill takes less than 2 seconds.  Neurological:     General: No focal deficit present.     Mental Status: She is alert and oriented to person, place, and time.  Psychiatric:        Mood and Affect: Mood normal.        Behavior: Behavior normal.    Results for orders placed or performed in visit on 07/15/22 (from the past 24 hour(s))  POC COVID-19     Status: Normal   Collection Time: 07/15/22  3:40 PM  Result Value Ref Range   SARS Coronavirus 2 Ag Negative Negative     ASSESSMENT &  PLAN: A total of 34 minutes was spent with the patient and counseling/coordination of care regarding preparing for this visit, review of most recent  office visit notes, review of chronic medical conditions under management, diagnosis of lower respiratory infection and need for antibiotics, prognosis, ED precautions, review of all medications, cough management, documentation and need for follow-up.  Problem List Items Addressed This Visit       Respiratory   Lower respiratory infection - Primary    Clinically stable.  No red flag signs or symptoms. Stable vital signs and stable O2 sat Recommend daily azithromycin for 5 days ED precautions given History of asthma.  Recommend to start prednisone 20 mg daily for 5 days      Relevant Medications   montelukast (SINGULAIR) 10 MG tablet   predniSONE (DELTASONE) 20 MG tablet   azithromycin (ZITHROMAX) 250 MG tablet     Other   Flu-like symptoms    Advised to rest and stay well-hydrated Tylenol for fever and aches      Relevant Orders   POC COVID-19 (Completed)   Fever    Advised to rest and stay well-hydrated Use Tylenol as needed for fever      Acute cough    Continue over-the-counter Mucinex DM and cough drops Advised to rest and stay well-hydrated Tessalon 200 mg 3 times a day as needed      Relevant Medications   benzonatate (TESSALON) 200 MG capsule   Patient Instructions  Cough, Adult A cough helps to clear your throat and lungs. It may be a sign of an illness or another condition. A short-term (acute) cough may last 2-3 weeks. A long-term (chronic) cough may last 8 or more weeks. Many things can cause a cough. They include: Illnesses such as: An infection in your throat or lungs. Asthma or other heart or lung problems. Gastroesophageal reflux. This is when acid comes back up from your stomach. Breathing in things that bother (irritate) your lungs. Allergies. Postnasal drip. This is when mucus runs down the back of your throat. Smoking. Some medicines. Follow these instructions at home: Medicines Take over-the-counter and prescription medicines only as told  by your doctor. Talk with your doctor before you take cough medicine (cough suppressants). Eating and drinking Do not drink alcohol. Do not drink caffeine. Drink enough fluid to keep your pee (urine) pale yellow. Lifestyle Stay away from cigarette smoke. Do not smoke or use any products that contain nicotine or tobacco. If you need help quitting, ask your doctor. Stay away from things that make you cough. These may include perfume, candles, cleaning products, or campfire smoke. General instructions  Watch for any changes to your cough. Tell your doctor about them. Always cover your mouth when you cough. If the air is dry in your home, use a cool mist vaporizer or humidifier. If your cough is worse at night, try using extra pillows to raise your head up higher while you sleep. Rest as needed. Contact a doctor if: You have new symptoms. Your symptoms get worse. You cough up pus. You have a fever that does not go away. Your cough does not get better after 2-3 weeks. Cough medicine does not help, and you are not sleeping well. You have pain that gets worse or is not helped with medicine. You are losing weight and do not know why. You have night sweats. Get help right away if: You cough up blood. You have trouble breathing. Your heart is beating very fast. These symptoms  may be an emergency. Get help right away. Call 911. Do not wait to see if the symptoms will go away. Do not drive yourself to the hospital. This information is not intended to replace advice given to you by your health care provider. Make sure you discuss any questions you have with your health care provider. Document Revised: 08/23/2021 Document Reviewed: 08/23/2021 Elsevier Patient Education  2024 Elsevier Inc.     Edwina Barth, MD [facility]

## 2022-07-15 NOTE — Assessment & Plan Note (Signed)
Continue over-the-counter Mucinex DM and cough drops Advised to rest and stay well-hydrated Tessalon 200 mg 3 times a day as needed

## 2022-07-15 NOTE — Assessment & Plan Note (Signed)
Advised to rest and stay well-hydrated Use Tylenol as needed for fever

## 2022-07-15 NOTE — Assessment & Plan Note (Signed)
Clinically stable.  No red flag signs or symptoms. Stable vital signs and stable O2 sat Recommend daily azithromycin for 5 days ED precautions given History of asthma.  Recommend to start prednisone 20 mg daily for 5 days

## 2022-07-15 NOTE — Patient Instructions (Signed)
Cough, Adult A cough helps to clear your throat and lungs. It may be a sign of an illness or another condition. A short-term (acute) cough may last 2-3 weeks. A long-term (chronic) cough may last 8 or more weeks. Many things can cause a cough. They include: Illnesses such as: An infection in your throat or lungs. Asthma or other heart or lung problems. Gastroesophageal reflux. This is when acid comes back up from your stomach. Breathing in things that bother (irritate) your lungs. Allergies. Postnasal drip. This is when mucus runs down the back of your throat. Smoking. Some medicines. Follow these instructions at home: Medicines Take over-the-counter and prescription medicines only as told by your doctor. Talk with your doctor before you take cough medicine (cough suppressants). Eating and drinking Do not drink alcohol. Do not drink caffeine. Drink enough fluid to keep your pee (urine) pale yellow. Lifestyle Stay away from cigarette smoke. Do not smoke or use any products that contain nicotine or tobacco. If you need help quitting, ask your doctor. Stay away from things that make you cough. These may include perfume, candles, cleaning products, or campfire smoke. General instructions  Watch for any changes to your cough. Tell your doctor about them. Always cover your mouth when you cough. If the air is dry in your home, use a cool mist vaporizer or humidifier. If your cough is worse at night, try using extra pillows to raise your head up higher while you sleep. Rest as needed. Contact a doctor if: You have new symptoms. Your symptoms get worse. You cough up pus. You have a fever that does not go away. Your cough does not get better after 2-3 weeks. Cough medicine does not help, and you are not sleeping well. You have pain that gets worse or is not helped with medicine. You are losing weight and do not know why. You have night sweats. Get help right away if: You cough up  blood. You have trouble breathing. Your heart is beating very fast. These symptoms may be an emergency. Get help right away. Call 911. Do not wait to see if the symptoms will go away. Do not drive yourself to the hospital. This information is not intended to replace advice given to you by your health care provider. Make sure you discuss any questions you have with your health care provider. Document Revised: 08/23/2021 Document Reviewed: 08/23/2021 Elsevier Patient Education  2024 Elsevier Inc.  

## 2022-07-21 ENCOUNTER — Telehealth: Payer: Self-pay

## 2022-07-21 ENCOUNTER — Ambulatory Visit: Payer: 59 | Admitting: Family Medicine

## 2022-07-21 NOTE — Telephone Encounter (Signed)
Patient has been advised and would like message sent to Dr. Lonzo Cloud to review when she returns to office.

## 2022-07-21 NOTE — Telephone Encounter (Signed)
Patient states that she has been experiencing hair loss and wants to know if it could be the increase in the thyroid medication.

## 2022-07-23 NOTE — Telephone Encounter (Signed)
 Patient notified of provider response

## 2022-07-23 NOTE — Telephone Encounter (Signed)
Tried to reach by phone unable to leave a voicemail

## 2022-08-03 ENCOUNTER — Telehealth: Payer: Self-pay

## 2022-08-03 ENCOUNTER — Ambulatory Visit
Admission: EM | Admit: 2022-08-03 | Discharge: 2022-08-03 | Disposition: A | Discharge status: Home / Self Care | Payer: 59 | Attending: Physician Assistant | Admitting: Physician Assistant

## 2022-08-03 DIAGNOSIS — J019 Acute sinusitis, unspecified: Secondary | ICD-10-CM | POA: Diagnosis not present

## 2022-08-03 MED ORDER — AMOXICILLIN-POT CLAVULANATE 875-125 MG PO TABS: 1.0000 | ORAL_TABLET | Freq: Two times a day (BID) | ORAL | 0 refills | Status: DC

## 2022-08-03 NOTE — ED Provider Notes (Signed)
Lindsay [name]    CSN: [id number] Arrival date & time: 08/03/22  0836      History   Chief Complaint No chief complaint on file.   HPI Lindsay [name] is a 77 y.o. female.   Patient here today for evaluation of low energy, generalized fatigue and weakness, nasal congestion with reported blood clots from nasal passage.  She also reports that she has some back pain when she coughs but states she does not cough very often.  She states she was diagnosed with flulike illness at the beginning of the month.  She was provided prescription for both antibiotic as well as prednisone at that time and did have improvement of symptoms.  She she denies fever.  She has not had any nausea, vomiting or diarrhea.  She notes she was having nasal blood clots when she saw her primary [name] provider at the beginning of the month.  She did not have blood work but is concerned for possible anemia.  She is also taking another antibiotic in the interim for coverage of UTI that she states she has prescription for on hand at pharmacy.  The history is provided by the patient.    Past Medical History:  Diagnosis Date   Acid reflux    Anemia    Arthritis    Asthma    COPD (chronic obstructive pulmonary disease) (HCC)    Diabetes mellitus without complication (HCC)    TYPE 2    Hyperglycemia    Hypertension    Hypokalemia    Hypothyroidism    Obesity    PONV (postoperative nausea and vomiting)    Spinal stenosis    Urinary tract infection, chronic     Patient Active Problem List   Diagnosis Date Noted   Flu-like symptoms 07/15/2022   Fever 07/15/2022   Lower respiratory infection 07/15/2022   Acute cough 07/15/2022   Other fatigue 07/19/2021   Eczema 10/24/2020   Primary osteoarthritis of right knee 09/30/2019   Status post total knee replacement, right 09/30/2019   Routine general medical examination at a health [name] facility 09/05/2019   Anemia, unspecified 07/28/2019   Hypothyroidism  01/12/2019   Ganglion of flexor tendon sheath of right middle finger 07/15/2018   Type 2 diabetes mellitus with complication (HCC) 02/11/2018   Essential hypertension 02/11/2018   Hyperlipidemia associated with type 2 diabetes mellitus (HCC) 02/11/2018   Morbid obesity (HCC) 05/17/2016   COPD (chronic obstructive pulmonary disease) (HCC)    Acid reflux    Urinary tract infection, chronic     Past Surgical History:  Procedure Laterality Date   ABDOMINAL HYSTERECTOMY     ANKLE FUSION Left    CHOLECYSTECTOMY     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   Procedure: TOTAL KNEE ARTHROPLASTY;  Surgeon: Jodi Geralds, MD;  Location: [facility];  Service: Orthopedics;  Laterality: Right;    OB History   No obstetric history on file.      Home Medications    Prior to Admission medications   Medication Sig Start Date End Date Taking? Authorizing Provider  amoxicillin-clavulanate (AUGMENTIN) 875-125 MG tablet Take 1 tablet by mouth every 12 (twelve) hours. 08/03/22  Yes Tomi Bamberger, PA-C  albuterol (VENTOLIN HFA) 108 (90 Base) MCG/ACT inhaler INHALE 2 PUFFS INTO THE LUNGS EVERY 6 HOURS AS NEEDED FOR WHEEZING OR SHORTNESS OF BREATH 06/10/21   Myrlene Broker, MD  azithromycin [facility]) 250 MG tablet Sig as indicated 07/15/22   Georgina Quint, MD  benzonatate (TESSALON) 200 MG capsule Take 1 capsule (200 mg total) by mouth 2 (two) times daily as needed for cough. 07/15/22   Georgina Quint, MD  blood glucose meter kit and supplies KIT Check blood sugar with glucometer every morning before your first meal 12/27/21   Elenore Paddy, NP  budesonide-formoterol [facility]) 80-4.5 MCG/ACT inhaler INHALE 2 PUFFS INTO THE LUNGS TWICE DAILY 06/10/21   Myrlene Broker, MD  cetirizine (ZYRTEC) 10 MG tablet Take 1 tablet (10 mg total) by mouth daily. FOR ASTHMA Patient taking differently: Take 10 mg by mouth daily. 09/05/19   Glenford Bayley, NP  Cholecalciferol (VITAMIN D3) 50 MCG  (2000 UT) capsule Take 1 capsule (2,000 Units total) by mouth daily. Must keep scheduled follow=-up appt for future refills 08/06/19   Medina-Vargas, Monina C, NP  cyclobenzaprine (FLEXERIL) 10 MG tablet Take by mouth. 07/18/21   [provider]  famotidine (PEPCID) 20 MG tablet One after supper 03/09/20   Nyoka Cowden, MD  gabapentin (NEURONTIN) 300 MG capsule Take 300 mg by mouth 3 (three) times daily.    [provider]  hydrOXYzine (VISTARIL) 25 MG capsule Take 1 capsule (25 mg total) by mouth 2 (two) times daily as needed. 04/24/22   Myrlene Broker, MD  levothyroxine (SYNTHROID) 88 MCG tablet Take 1 tablet (88 mcg total) by mouth daily. 03/10/22   Shamleffer, Konrad Dolores, MD  losartan (COZAAR) 50 MG tablet TAKE 1 TABLET(50 MG) BY MOUTH DAILY 03/19/22   Myrlene Broker, MD  meloxicam (MOBIC) 7.5 MG tablet TAKE 1 TABLET(7.5 MG) BY MOUTH DAILY 06/04/22   Myrlene Broker, MD  Menthol-Methyl Salicylate (SALONPAS PAIN RELIEF PATCH EX) Place 1 patch onto the skin daily as needed (pain).    [provider]  montelukast (SINGULAIR) 10 MG tablet TAKE 1 TABLET(10 MG) BY MOUTH AT BEDTIME 07/15/22   Georgina Quint, MD  NON FORMULARY DIET- REGULAR CCD 07/27/19   [provider]  oxybutynin (DITROPAN) 5 MG tablet Take 1 tablet (5 mg total) by mouth 2 (two) times daily. Patient taking differently: Take 10 mg by mouth daily. 08/06/19   Medina-Vargas, Monina C, NP  pantoprazole (PROTONIX) 40 MG tablet TAKE 1 TABLET(40 MG) BY MOUTH DAILY 30 TO 60 MINUTES BEFORE FIRST MEAL OF THE DAY 08/08/21   Myrlene Broker, MD  RYBELSUS 7 MG TABS TAKE 1 TABLET BY MOUTH DAILY 06/04/22   Myrlene Broker, MD  simvastatin (ZOCOR) 40 MG tablet TAKE 1 TABLET(40 MG) BY MOUTH DAILY AT 6 PM 03/13/22   Myrlene Broker, MD  tiZANidine (ZANAFLEX) 4 MG tablet Take 0.5-1 tablets (2-4 mg total) by mouth every 8 (eight) hours as needed for muscle spasms. 06/18/21   Myrlene Broker, MD    Family History Family History  Problem Relation Age of Onset   Microcephaly Maternal Grandmother    Heart disease Maternal Grandmother        Not clear details, patient just knows she took "heart pills"   Asthma Daughter     Social History Social History   Tobacco Use   Smoking status: Former    Current packs/day: 0.00    Average packs/day: 0.5 packs/day for 20.0 years (10.0 ttl pk-yrs)    Types: Cigarettes    Start date: 01/06/1977    Quit date: 01/06/1997    Years since quitting: 25.5   Smokeless tobacco: Never  Vaping Use   Vaping status: Never Used  Substance Use Topics  Alcohol use: No   Drug use: No     Allergies   Codeine, Lisinopril, Morphine and codeine, and Shellfish allergy   Review of Systems Review of Systems  Constitutional:  Positive for fatigue. Negative for chills and fever.  HENT:  Positive for congestion. Negative for ear pain, nosebleeds and sore throat.   Eyes:  Negative for discharge and redness.  Respiratory:  Positive for cough (rare). Negative for shortness of breath and wheezing.   Gastrointestinal:  Negative for abdominal pain, diarrhea, nausea and vomiting.     Physical Exam Triage Vital Signs ED Triage Vitals  Encounter Vitals Group     BP      Systolic BP Percentile      Diastolic BP Percentile      Pulse      Resp      Temp      Temp src      SpO2      Weight      Height      Head Circumference      Peak Flow      Pain Score      Pain Loc      Pain Education      Exclude from Growth Chart    No data found.  Updated Vital Signs BP 107/71 (BP Location: Right Arm)   Pulse 87   Temp 98 F (36.7 C) (Oral)   Resp 13   SpO2 95%      Physical Exam Vitals and nursing note reviewed.  Constitutional:      General: She is not in acute distress.    Appearance: Normal appearance. She is not ill-appearing.  HENT:     Head: Normocephalic and atraumatic.     Right Ear: Tympanic membrane normal.      Left Ear: Tympanic membrane normal.     Nose: Congestion (mild) present.     Mouth/Throat:     Mouth: Mucous membranes are moist.     Pharynx: No oropharyngeal exudate or posterior oropharyngeal erythema.  Eyes:     Conjunctiva/sclera: Conjunctivae normal.  Cardiovascular:     Rate and Rhythm: Normal rate and regular rhythm.     Heart sounds: Normal heart sounds. No murmur heard. Pulmonary:     Effort: Pulmonary effort is normal. No respiratory distress.     Breath sounds: Normal breath sounds. No wheezing, rhonchi or rales.  Skin:    General: Skin is warm and dry.  Neurological:     Mental Status: She is alert.  Psychiatric:        Mood and Affect: Mood normal.        Thought Content: Thought content normal.      UC Treatments / Results  Labs (all labs ordered are listed, but only abnormal results are displayed) Labs Reviewed  CBC WITH DIFFERENTIAL/PLATELET  COMPREHENSIVE METABOLIC PANEL    EKG   Radiology No results found.  Procedures Procedures (including critical [name] time)  Medications Ordered in UC Medications - No data to display  Initial Impression / Assessment and Plan / UC Course  I have reviewed the triage vital signs and the nursing notes.  Pertinent labs & imaging results that were available during my [name] of the patient were reviewed by me and considered in my medical decision making (see chart for details).    Suspect blood clots are likely secondary to possible sinusitis given recent illness and continued drainage and congestion.  Will order labs to rule out anemia  given patient's concern. Low suspicion for pneumonia, but augmentin would provide further coverage for same- CXR deferred today. Recommended further evaluation by primary [name] if symptoms continue.  Encouraged sooner follow-up with any worsening symptoms.  Final Clinical Impressions(s) / UC Diagnoses   Final diagnoses:  Acute sinusitis, recurrence not specified, unspecified location      Discharge Instructions      Please follow up with PCP if you are not feeling improvement of symptoms with antibiotic therapy.     ED Prescriptions     Medication Sig Dispense Auth. Provider   amoxicillin-clavulanate (AUGMENTIN) 875-125 MG tablet Take 1 tablet by mouth every 12 (twelve) hours. 14 tablet Tomi Bamberger, PA-C      PDMP not reviewed this encounter.   Tomi Bamberger, PA-C 08/03/22 [id number]

## 2022-08-03 NOTE — Telephone Encounter (Signed)
Pt called stating that the pharmacy she had chosen was closed today when she got there and has requested to switch the pharmacy to [facility] on [address].   Prescription has been sent in to [facility] on [address].

## 2022-08-03 NOTE — ED Triage Notes (Signed)
Pt c/o low energy, nasal congestion, back pain when she coughs, clots forming in her nose. Chills sweats headaches. On 07/15/2022 pt states she was diagnosed with the flu.

## 2022-08-03 NOTE — Discharge Instructions (Signed)
Please follow up with PCP if you are not feeling improvement of symptoms with antibiotic therapy.

## 2022-08-06 ENCOUNTER — Telehealth: Payer: Self-pay | Admitting: Internal Medicine

## 2022-08-06 NOTE — Telephone Encounter (Signed)
Patient called requesting blood work results.  Educated her that there is no acute abnormalities noted and that she needs to follow-up with primary care doctor for any further evaluation and management.  All questions answered.

## 2022-08-20 ENCOUNTER — Other Ambulatory Visit: Payer: Self-pay | Admitting: Internal Medicine

## 2022-08-20 DIAGNOSIS — J449 Chronic obstructive pulmonary disease, unspecified: Secondary | ICD-10-CM

## 2022-08-20 DIAGNOSIS — R051 Acute cough: Secondary | ICD-10-CM

## 2022-08-22 ENCOUNTER — Ambulatory Visit
Admission: EM | Admit: 2022-08-22 | Discharge: 2022-08-22 | Disposition: A | Discharge status: Home / Self Care | Payer: 59 | Attending: Physician Assistant | Admitting: Physician Assistant

## 2022-08-22 DIAGNOSIS — N3000 Acute cystitis without hematuria: Secondary | ICD-10-CM | POA: Diagnosis not present

## 2022-08-22 LAB — POCT URINALYSIS DIP (MANUAL ENTRY)
Bilirubin, UA: NEGATIVE
Blood, UA: NEGATIVE
Glucose, UA: NEGATIVE mg/dL
Ketones, POC UA: NEGATIVE mg/dL
Nitrite, UA: POSITIVE — AB
Protein Ur, POC: 30 mg/dL — AB
Spec Grav, UA: 1.025 (ref 1.010–1.025)
Urobilinogen, UA: 0.2 E.U./dL
pH, UA: 6 (ref 5.0–8.0)

## 2022-08-22 MED ORDER — AMOXICILLIN-POT CLAVULANATE 875-125 MG PO TABS: 1.0000 | ORAL_TABLET | Freq: Two times a day (BID) | ORAL | 0 refills | Status: AC

## 2022-08-22 NOTE — ED Provider Notes (Signed)
[facility]    CSN: [id number] Arrival date & time: 08/22/22  1050      History   Chief Complaint Chief Complaint  Patient presents with   UTI Symptoms    HPI Lindsay [name] is a 77 y.o. female.   Patient here today for evaluation of possible UTI.  She states that 2 days ago she started to have urinary frequency as well as an odor to her urine.  She denies any dysuria.  She has not had any fever that she is aware of.  She denies any treatment for symptoms.  The history is provided by the patient.    Past Medical History:  Diagnosis Date   Acid reflux    Anemia    Arthritis    Asthma    COPD (chronic obstructive pulmonary disease) (HCC)    Diabetes mellitus without complication (HCC)    TYPE 2    Hyperglycemia    Hypertension    Hypokalemia    Hypothyroidism    Obesity    PONV (postoperative nausea and vomiting)    Spinal stenosis    Urinary tract infection, chronic     Patient Active Problem List   Diagnosis Date Noted   Flu-like symptoms 07/15/2022   Fever 07/15/2022   Lower respiratory infection 07/15/2022   Acute cough 07/15/2022   Other fatigue 07/19/2021   Eczema 10/24/2020   Primary osteoarthritis of right knee 09/30/2019   Status post total knee replacement, right 09/30/2019   Routine general medical examination at a health care facility 09/05/2019   Anemia, unspecified 07/28/2019   Hypothyroidism 01/12/2019   Ganglion of flexor tendon sheath of right middle finger 07/15/2018   Type 2 diabetes mellitus with complication (HCC) 02/11/2018   Essential hypertension 02/11/2018   Hyperlipidemia associated with type 2 diabetes mellitus (HCC) 02/11/2018   Morbid obesity (HCC) 05/17/2016   COPD (chronic obstructive pulmonary disease) (HCC)    Acid reflux    Urinary tract infection, chronic     Past Surgical History:  Procedure Laterality Date   ABDOMINAL HYSTERECTOMY     ANKLE FUSION Left    CHOLECYSTECTOMY     TOTAL KNEE  ARTHROPLASTY Right 09/30/2019   Procedure: TOTAL KNEE ARTHROPLASTY;  Surgeon: Jodi Geralds, MD;  Location: [facility];  Service: Orthopedics;  Laterality: Right;    OB History   No obstetric history on file.      Home Medications    Prior to Admission medications   Medication Sig Start Date End Date Taking? Authorizing Provider  albuterol (VENTOLIN HFA) 108 (90 Base) MCG/ACT inhaler INHALE 2 PUFFS INTO THE LUNGS EVERY 6 HOURS AS NEEDED FOR WHEEZING OR SHORTNESS OF BREATH 08/20/22   Myrlene Broker, MD  amoxicillin-clavulanate (AUGMENTIN) 875-125 MG tablet Take 1 tablet by mouth every 12 (twelve) hours for 7 days. 08/22/22 08/29/22  Tomi Bamberger, PA-C  azithromycin (ZITHROMAX) 250 MG tablet Sig as indicated 07/15/22   Georgina Quint, MD  benzonatate (TESSALON) 200 MG capsule TAKE 1 CAPSULE(200 MG) BY MOUTH TWICE DAILY AS NEEDED FOR COUGH 08/20/22   Myrlene Broker, MD  blood glucose meter kit and supplies KIT Check blood sugar with glucometer every morning before your first meal 12/27/21   Elenore Paddy, NP  budesonide-formoterol (SYMBICORT) 80-4.5 MCG/ACT inhaler INHALE 2 PUFFS INTO THE LUNGS TWICE DAILY 08/20/22   Myrlene Broker, MD  cetirizine (ZYRTEC) 10 MG tablet Take 1 tablet (10 mg total) by mouth daily. FOR ASTHMA Patient taking  differently: Take 10 mg by mouth daily. 09/05/19   Glenford Bayley, NP  Cholecalciferol (VITAMIN D3) 50 MCG (2000 UT) capsule Take 1 capsule (2,000 Units total) by mouth daily. Must keep scheduled follow=-up appt for future refills 08/06/19   Medina-Vargas, Monina C, NP  cyclobenzaprine (FLEXERIL) 10 MG tablet Take by mouth. 07/18/21   [provider]  famotidine (PEPCID) 20 MG tablet One after supper 03/09/20   Nyoka Cowden, MD  gabapentin (NEURONTIN) 300 MG capsule Take 300 mg by mouth 3 (three) times daily.    [provider]  hydrOXYzine (VISTARIL) 25 MG capsule Take 1 capsule (25 mg total) by mouth 2 (two) times daily  as needed. 04/24/22   Myrlene Broker, MD  levothyroxine (SYNTHROID) 88 MCG tablet Take 1 tablet (88 mcg total) by mouth daily. 03/10/22   Shamleffer, Konrad Dolores, MD  losartan (COZAAR) 50 MG tablet TAKE 1 TABLET(50 MG) BY MOUTH DAILY 03/19/22   Myrlene Broker, MD  meloxicam (MOBIC) 7.5 MG tablet TAKE 1 TABLET(7.5 MG) BY MOUTH DAILY 06/04/22   Myrlene Broker, MD  Menthol-Methyl Salicylate (SALONPAS PAIN RELIEF PATCH EX) Place 1 patch onto the skin daily as needed (pain).    [provider]  montelukast (SINGULAIR) 10 MG tablet TAKE 1 TABLET(10 MG) BY MOUTH AT BEDTIME 07/15/22   Georgina Quint, MD  NON FORMULARY DIET- REGULAR CCD 07/27/19   [provider]  oxybutynin (DITROPAN) 5 MG tablet Take 1 tablet (5 mg total) by mouth 2 (two) times daily. Patient taking differently: Take 10 mg by mouth daily. 08/06/19   Medina-Vargas, Monina C, NP  pantoprazole (PROTONIX) 40 MG tablet TAKE 1 TABLET(40 MG) BY MOUTH DAILY 30 TO 60 MINUTES BEFORE FIRST MEAL OF THE DAY 08/08/21   Myrlene Broker, MD  RYBELSUS 7 MG TABS TAKE 1 TABLET BY MOUTH DAILY 06/04/22   Myrlene Broker, MD  simvastatin (ZOCOR) 40 MG tablet TAKE 1 TABLET(40 MG) BY MOUTH DAILY AT 6 PM 03/13/22   Myrlene Broker, MD  tiZANidine (ZANAFLEX) 4 MG tablet Take 0.5-1 tablets (2-4 mg total) by mouth every 8 (eight) hours as needed for muscle spasms. 06/18/21   Myrlene Broker, MD    Family History Family History  Problem Relation Age of Onset   Microcephaly Maternal Grandmother    Heart disease Maternal Grandmother        Not clear details, patient just knows she took "heart pills"   Asthma Daughter     Social History Social History   Tobacco Use   Smoking status: Former    Current packs/day: 0.00    Average packs/day: 0.5 packs/day for 20.0 years (10.0 ttl pk-yrs)    Types: Cigarettes    Start date: 01/06/1977    Quit date: 01/06/1997    Years since quitting: 25.6   Smokeless  tobacco: Never  Vaping Use   Vaping status: Never Used  Substance Use Topics   Alcohol use: No   Drug use: No     Allergies   Codeine, Lisinopril, Morphine and codeine, and Shellfish allergy   Review of Systems Review of Systems  Constitutional:  Negative for chills and fever.  Eyes:  Negative for discharge and redness.  Gastrointestinal:  Negative for abdominal pain, nausea and vomiting.  Genitourinary:  Positive for frequency and urgency. Negative for dysuria.     Physical Exam Triage Vital Signs ED Triage Vitals  Encounter Vitals Group     BP 08/22/22 1059 Marland Kitchen)  142/89     Systolic BP Percentile --      Diastolic BP Percentile --      Pulse Rate 08/22/22 1059 84     Resp 08/22/22 1059 20     Temp 08/22/22 1059 98.3 F (36.8 C)     Temp Source 08/22/22 1059 Oral     SpO2 08/22/22 1059 97 %     Weight 08/22/22 1058 206 lb (93.4 kg)     Height 08/22/22 1058 5\' 3"  (1.6 m)     Head Circumference --      Peak Flow --      Pain Score 08/22/22 1056 0     Pain Loc --      Pain Education --      Exclude from Growth Chart --    No data found.  Updated Vital Signs BP (!) 140/89 (BP Location: Left Arm)   Pulse (!) 101 Comment: See  Add. Notes.  Temp 98.3 F (36.8 C) (Oral)   Resp 20 Comment: See Add. Notes  Ht 5\' 3"  (1.6 m)   Wt 206 lb (93.4 kg)   SpO2 99% Comment: See Add. Notes  BMI 36.49 kg/m     Physical Exam Vitals and nursing note reviewed.  Constitutional:      General: She is not in acute distress.    Appearance: Normal appearance. She is not ill-appearing.  HENT:     Head: Normocephalic and atraumatic.  Eyes:     Conjunctiva/sclera: Conjunctivae normal.  Cardiovascular:     Rate and Rhythm: Normal rate.  Pulmonary:     Effort: Pulmonary effort is normal. No respiratory distress.  Neurological:     Mental Status: She is alert.  Psychiatric:        Mood and Affect: Mood normal.        Behavior: Behavior normal.        Thought Content: Thought  content normal.      UC Treatments / Results  Labs (all labs ordered are listed, but only abnormal results are displayed) Labs Reviewed  POCT URINALYSIS DIP (MANUAL ENTRY) - Abnormal; Notable for the following components:      Result Value   Color, UA light yellow (*)    Clarity, UA cloudy (*)    Protein Ur, POC =30 (*)    Nitrite, UA Positive (*)    Leukocytes, UA Small (1+) (*)    All other components within normal limits  URINE CULTURE    EKG   Radiology No results found.  Procedures Procedures (including critical care time)  Medications Ordered in UC Medications - No data to display  Initial Impression / Assessment and Plan / UC Course  I have reviewed the triage vital signs and the nursing notes.  Pertinent labs & imaging results that were available during my care of the patient were reviewed by me and considered in my medical decision making (see chart for details).    Will treat to cover UTI.  Urine culture ordered.  Recommended follow-up if no gradual improvement with any further concerns.  Final Clinical Impressions(s) / UC Diagnoses   Final diagnoses:  Acute cystitis without hematuria   Discharge Instructions   None    ED Prescriptions     Medication Sig Dispense Auth. Provider   amoxicillin-clavulanate (AUGMENTIN) 875-125 MG tablet Take 1 tablet by mouth every 12 (twelve) hours for 7 days. 14 tablet Tomi Bamberger, PA-C      PDMP not reviewed this encounter.  Tomi Bamberger, PA-C 08/22/22 1311

## 2022-08-22 NOTE — ED Triage Notes (Signed)
"  I think I have a UTI, Wednesday night started with Urine odor, Urine is cloudy, No painful urination". No fever known.

## 2022-08-22 NOTE — ED Notes (Signed)
Patient had "coughing attack during/after vital signs" followed by a few vomiting episodes. "Choked up while trying to hold back cough".

## 2022-08-24 LAB — URINE CULTURE: Culture: >=100000 — AB

## 2022-08-26 ENCOUNTER — Encounter: Payer: Self-pay | Admitting: Emergency Medicine

## 2022-08-26 ENCOUNTER — Ambulatory Visit: Payer: 59 | Admitting: Emergency Medicine

## 2022-08-26 ENCOUNTER — Ambulatory Visit (INDEPENDENT_AMBULATORY_CARE_PROVIDER_SITE_OTHER): Payer: 59

## 2022-08-26 VITALS — BP 148/76 | HR 94 | Temp 98.9°F | Ht 63.0 in | Wt 203.6 lb

## 2022-08-26 DIAGNOSIS — R053 Chronic cough: Secondary | ICD-10-CM | POA: Diagnosis not present

## 2022-08-26 DIAGNOSIS — R079 Chest pain, unspecified: Secondary | ICD-10-CM | POA: Diagnosis not present

## 2022-08-26 DIAGNOSIS — J42 Unspecified chronic bronchitis: Secondary | ICD-10-CM | POA: Diagnosis not present

## 2022-08-26 DIAGNOSIS — R051 Acute cough: Secondary | ICD-10-CM

## 2022-08-26 DIAGNOSIS — J22 Unspecified acute lower respiratory infection: Secondary | ICD-10-CM

## 2022-08-26 LAB — POCT INFLUENZA A/B
Influenza A, POC: NEGATIVE
Influenza B, POC: NEGATIVE

## 2022-08-26 LAB — POC COVID19 BINAXNOW: SARS Coronavirus 2 Ag: NEGATIVE

## 2022-08-26 MED ORDER — BENZONATATE 200 MG PO CAPS: 200.0000 mg | ORAL_CAPSULE | Freq: Three times a day (TID) | ORAL | 1 refills | Status: DC | PRN

## 2022-08-26 MED ORDER — BREZTRI AEROSPHERE 160-9-4.8 MCG/ACT IN AERO: 2.0000 | INHALATION_SPRAY | Freq: Two times a day (BID) | RESPIRATORY_TRACT | 11 refills | Status: DC

## 2022-08-26 MED ORDER — HYDROCOD POLI-CHLORPHE POLI ER 10-8 MG/5ML PO SUER: 5.0000 mL | Freq: Two times a day (BID) | ORAL | 0 refills | Status: DC | PRN

## 2022-08-26 NOTE — Progress Notes (Signed)
Lindsay Lindsay [name] 77 y.o.   Chief Complaint  Lindsay [name] presents with   Cough    Lindsay [name] states that she's been coughing since Lindsay 8th of last month. She states that she feels very bad, No energy and out of breath. Lindsay [name] has taken tessalon pearls, delson, Nyquil still no relief. Lindsay [name] was tested for Covid and Lindsay flu put they were both negative    Chest Pain    TIGHTNESS. Due to Lindsay coughing     HISTORY OF PRESENT ILLNESS: This is a 77 y.o. female complaining of persistent cough for Lindsay past 4 to 5 weeks. Feels tired and out of breath. Occasional chest pain due to coughing. Recently seen at urgent care center for UTI and started on Augmentin.  Still taking it.   Cough Associated symptoms include chest pain, a fever, shortness of breath and wheezing. Pertinent negatives include no headaches, rash or sore throat.  Chest Pain  Associated symptoms include a cough, a fever and shortness of breath. Pertinent negatives include no abdominal pain, dizziness, headaches, nausea or vomiting.     Prior to Admission medications   Medication Sig Start Date End Date Taking? Authorizing Provider  albuterol (VENTOLIN HFA) 108 (90 Base) MCG/ACT inhaler INHALE 2 PUFFS INTO Lindsay LUNGS EVERY 6 HOURS AS NEEDED FOR WHEEZING OR SHORTNESS OF BREATH 08/20/22  Yes Myrlene Broker, MD  amoxicillin-clavulanate (AUGMENTIN) 875-125 MG tablet Take 1 tablet by mouth every 12 (twelve) hours for 7 days. 08/22/22 08/29/22 Yes Tomi Bamberger, PA-C  azithromycin (ZITHROMAX) 250 MG tablet Sig as indicated 07/15/22  Yes Stillman Buenger, Eilleen Kempf, MD  benzonatate (TESSALON) 200 MG capsule TAKE 1 CAPSULE(200 MG) BY MOUTH TWICE DAILY AS NEEDED FOR COUGH 08/20/22  Yes Myrlene Broker, MD  blood glucose meter kit and supplies KIT Check blood sugar with glucometer every morning before your first meal 12/27/21  Yes Elenore Paddy, NP  budesonide-formoterol (SYMBICORT) 80-4.5 MCG/ACT inhaler INHALE 2 PUFFS INTO Lindsay LUNGS TWICE  DAILY 08/20/22  Yes Myrlene Broker, MD  cetirizine (ZYRTEC) 10 MG tablet Take 1 tablet (10 mg total) by mouth daily. FOR ASTHMA Lindsay [name] taking differently: Take 10 mg by mouth daily. 09/05/19  Yes Glenford Bayley, NP  Cholecalciferol (VITAMIN D3) 50 MCG (2000 UT) capsule Take 1 capsule (2,000 Units total) by mouth daily. Must keep scheduled follow=-up appt for future refills 08/06/19  Yes Medina-Vargas, Monina C, NP  cyclobenzaprine (FLEXERIL) 10 MG tablet Take by mouth. 07/18/21  Yes [provider]  famotidine (PEPCID) 20 MG tablet One after supper 03/09/20  Yes Nyoka Cowden, MD  gabapentin (NEURONTIN) 300 MG capsule Take 300 mg by mouth 3 (three) times daily.   Yes [provider]  hydrOXYzine (VISTARIL) 25 MG capsule Take 1 capsule (25 mg total) by mouth 2 (two) times daily as needed. 04/24/22  Yes Myrlene Broker, MD  levothyroxine (SYNTHROID) 88 MCG tablet Take 1 tablet (88 mcg total) by mouth daily. 03/10/22  Yes Shamleffer, Konrad Dolores, MD  losartan (COZAAR) 50 MG tablet TAKE 1 TABLET(50 MG) BY MOUTH DAILY 03/19/22  Yes Myrlene Broker, MD  meloxicam (MOBIC) 7.5 MG tablet TAKE 1 TABLET(7.5 MG) BY MOUTH DAILY 06/04/22  Yes Myrlene Broker, MD  Menthol-Methyl Salicylate (SALONPAS PAIN RELIEF PATCH EX) Place 1 patch onto Lindsay skin daily as needed (pain).   Yes [provider]  montelukast (SINGULAIR) 10 MG tablet TAKE 1 TABLET(10 MG) BY MOUTH AT BEDTIME 07/15/22  Yes Laaibah Wartman, Blythe,  MD  NON FORMULARY DIET- REGULAR CCD 07/27/19  Yes [provider]  oxybutynin (DITROPAN) 5 MG tablet Take 1 tablet (5 mg total) by mouth 2 (two) times daily. Lindsay [name] taking differently: Take 10 mg by mouth daily. 08/06/19  Yes Medina-Vargas, Monina C, NP  pantoprazole (PROTONIX) 40 MG tablet TAKE 1 TABLET(40 MG) BY MOUTH DAILY 30 TO 60 MINUTES BEFORE FIRST MEAL OF Lindsay DAY 08/08/21  Yes Myrlene Broker, MD  RYBELSUS 7 MG TABS TAKE 1 TABLET BY MOUTH  DAILY 06/04/22  Yes Myrlene Broker, MD  simvastatin (ZOCOR) 40 MG tablet TAKE 1 TABLET(40 MG) BY MOUTH DAILY AT 6 PM 03/13/22  Yes Myrlene Broker, MD  tiZANidine (ZANAFLEX) 4 MG tablet Take 0.5-1 tablets (2-4 mg total) by mouth every 8 (eight) hours as needed for muscle spasms. 06/18/21  Yes Myrlene Broker, MD    Allergies  Allergen Reactions   Codeine Nausea Only   Lisinopril Cough   Morphine And Codeine Itching   Shellfish Allergy Itching    Lindsay [name] Active Problem List   Diagnosis Date Noted   Flu-like symptoms 07/15/2022   Fever 07/15/2022   Lower respiratory infection 07/15/2022   Acute cough 07/15/2022   Other fatigue 07/19/2021   Eczema 10/24/2020   Primary osteoarthritis of right knee 09/30/2019   Status post total knee replacement, right 09/30/2019   Routine general medical examination at a health care facility 09/05/2019   Anemia, unspecified 07/28/2019   Hypothyroidism 01/12/2019   Ganglion of flexor tendon sheath of right middle finger 07/15/2018   Type 2 diabetes mellitus with complication (HCC) 02/11/2018   Essential hypertension 02/11/2018   Hyperlipidemia associated with type 2 diabetes mellitus (HCC) 02/11/2018   Morbid obesity (HCC) 05/17/2016   COPD (chronic obstructive pulmonary disease) (HCC)    Acid reflux    Urinary tract infection, chronic     Past Medical History:  Diagnosis Date   Acid reflux    Anemia    Arthritis    Asthma    COPD (chronic obstructive pulmonary disease) (HCC)    Diabetes mellitus without complication (HCC)    TYPE 2    Hyperglycemia    Hypertension    Hypokalemia    Hypothyroidism    Obesity    PONV (postoperative nausea and vomiting)    Spinal stenosis    Urinary tract infection, chronic     Past Surgical History:  Procedure Laterality Date   ABDOMINAL HYSTERECTOMY     ANKLE FUSION Left    CHOLECYSTECTOMY     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   Procedure: TOTAL KNEE ARTHROPLASTY;  Surgeon:  Jodi Geralds, MD;  Location: [facility];  Service: Orthopedics;  Laterality: Right;    Social History   Socioeconomic History   Marital status: Single    Spouse name: Not on file   Number of children: 1   Years of education: 72   Highest education level: Not on file  Occupational History   Not on file  Tobacco Use   Smoking status: Former    Current packs/day: 0.00    Average packs/day: 0.5 packs/day for 20.0 years (10.0 ttl pk-yrs)    Types: Cigarettes    Start date: 01/06/1977    Quit date: 01/06/1997    Years since quitting: 25.6   Smokeless tobacco: Never  Vaping Use   Vaping status: Never Used  Substance and Sexual Activity   Alcohol use: No   Drug use: No   Sexual activity: Not Currently  Other Topics Concern   Not on file  Social History Narrative   Not on file   Social Determinants of Health   Financial Resource Strain: Low Risk  (03/13/2022)   Overall Financial Resource Strain (CARDIA)    Difficulty of Paying Living Expenses: Not hard at all  Food Insecurity: No Food Insecurity (03/13/2022)   Hunger Vital Sign    Worried About Running Out of Food in Lindsay Last Year: Never true    Ran Out of Food in Lindsay Last Year: Never true  Transportation Needs: No Transportation Needs (03/13/2022)   PRAPARE - Administrator, Civil Service (Medical): No    Lack of Transportation (Non-Medical): No  Physical Activity: Inactive (03/13/2022)   Exercise Vital Sign    Days of Exercise per Week: 0 days    Minutes of Exercise per Session: 0 min  Stress: No Stress Concern Present (03/13/2022)   [employer] of Occupational Health - Occupational Stress Questionnaire    Feeling of Stress : Only a little  Social Connections: Moderately Isolated (03/13/2022)   Social Connection and Isolation Panel [NHANES]    Frequency of Communication with Friends and Family: Twice a week    Frequency of Social Gatherings with Friends and Family: Twice a week    Attends Religious Services: More than 4  times per year    Active Member of [employer] or Organizations: No    Attends Banker Meetings: Never    Marital Status: Never married  Intimate Partner Violence: Not At Risk (03/13/2022)   Humiliation, Afraid, Rape, and Kick questionnaire    Fear of Current or Ex-Partner: No    Emotionally Abused: No    Physically Abused: No    Sexually Abused: No    Family History  Problem Relation Age of Onset   Microcephaly Maternal Grandmother    Heart disease Maternal Grandmother        Not clear details, Lindsay [name] just knows she took "heart pills"   Asthma Daughter      Review of Systems  Constitutional:  Positive for fever.  HENT:  Negative for congestion and sore throat.   Respiratory:  Positive for cough, shortness of breath and wheezing.   Cardiovascular:  Positive for chest pain.  Gastrointestinal:  Negative for abdominal pain, diarrhea, nausea and vomiting.  Genitourinary: Negative.  Negative for dysuria and hematuria.  Skin: Negative.  Negative for rash.  Neurological: Negative.  Negative for dizziness and headaches.    Vitals:   08/26/22 1328  BP: (!) 148/76  Pulse: 94  Temp: 98.9 F (37.2 C)  SpO2: 96%    Physical Exam Vitals reviewed.  Constitutional:      Appearance: She is well-developed.  HENT:     Head: Normocephalic.  Eyes:     Extraocular Movements: Extraocular movements intact.  Cardiovascular:     Rate and Rhythm: Normal rate and regular rhythm.     Pulses: Normal pulses.     Heart sounds: Normal heart sounds.  Pulmonary:     Effort: Pulmonary effort is normal.     Breath sounds: Normal breath sounds.  Musculoskeletal:     Cervical back: No tenderness.  Lymphadenopathy:     Cervical: No cervical adenopathy.  Skin:    General: Skin is warm and dry.     Capillary Refill: Capillary refill takes less than 2 seconds.  Neurological:     General: No focal deficit present.     Mental Status: She is alert and oriented  to person, place, and time.   Psychiatric:        Mood and Affect: Mood normal.        Behavior: Behavior normal.   EKG: Normal sinus rhythm with ventricular rate of 72/min.  No acute ischemic changes.  Normal EKG.  DG Chest 2 View  Result Date: 08/26/2022 CLINICAL DATA:  Persistent cough. EXAM: CHEST - 2 VIEW COMPARISON:  March 09, 2020 FINDINGS: Lindsay heart size and mediastinal contours are within normal limits. Both lungs are clear. Lindsay visualized skeletal structures are unremarkable. IMPRESSION: No active cardiopulmonary disease. Electronically Signed   By: Ted Mcalpine M.D.   On: 08/26/2022 16:11     ASSESSMENT & PLAN: A total of 44 minutes was spent with Lindsay Lindsay [name] and counseling/coordination of care regarding preparing for this visit, review of most recent office visit notes, review of multiple chronic medical conditions under management, differential diagnosis of persistent cough, cough management, review of chest x-ray done today, review of all medications, prognosis, ED precautions, documentation, and need for follow-up   Problem List Items Addressed This Visit       Respiratory   COPD (chronic obstructive pulmonary disease) (HCC)    Needs to follow-up with pulmonologist Recommend to switch his Symbicort to [facility] twice a day Continue albuterol as needed Continue over-Lindsay-counter Mucinex DM Unremarkable chest x-ray      Relevant Medications   Budeson-Glycopyrrol-Formoterol (BREZTRI AEROSPHERE) 160-9-4.8 MCG/ACT AERO   benzonatate (TESSALON) 200 MG capsule   Other Relevant Orders   EKG 12-Lead   Lower respiratory infection    Unremarkable chest x-ray.  No pneumonia. Clinically stable. Continue and finish Augmentin. Advised to rest and stay well-hydrated Most likely viral infection      Relevant Medications   Budeson-Glycopyrrol-Formoterol (BREZTRI AEROSPHERE) 160-9-4.8 MCG/ACT AERO     Other   Nonspecific chest pain    Clinically stable.  No red flag signs or symptoms Benign physical  examination Normal EKG and normal chest x-ray Most likely related to persistent cough and may be some degree of pleurisy.  Pain management discussed.      Persistent cough - Primary    Cough management discussed Continue over-Lindsay-counter Mucinex DM and cough drops Advised to stay well-hydrated Allergic to codeine and morphine. Continue Tessalon 200 mg 3 times a day Needs follow-up with pulmonary doctor and PCP      Relevant Medications   Budeson-Glycopyrrol-Formoterol (BREZTRI AEROSPHERE) 160-9-4.8 MCG/ACT AERO   benzonatate (TESSALON) 200 MG capsule   Other Relevant Orders   DG Chest 2 View (Completed)   EKG 12-Lead   POCT Influenza A/B (Completed)   POC COVID-19 (Completed)     Lindsay [name] Instructions  Continue Augmentin twice a day until finished Chest x-ray today Start Tussionex syrup every 12 hours as needed Continue over-Lindsay-counter Mucinex DM Stop Symbicort and start Breztri twice a day Follow-up with PCP in 1 to 2 weeks  Cough, Adult A cough helps to clear your throat and lungs. It may be a sign of an illness or another condition. A short-term (acute) cough may last 2-3 weeks. A long-term (chronic) cough may last 8 or more weeks. Many things can cause a cough. They include: Illnesses such as: An infection in your throat or lungs. Asthma or other heart or lung problems. Gastroesophageal reflux. This is when acid comes back up from your stomach. Breathing in things that bother (irritate) your lungs. Allergies. Postnasal drip. This is when mucus runs down Lindsay back of your throat. Smoking. Some medicines. Follow  these instructions at home: Medicines Take over-Lindsay-counter and prescription medicines only as told by your doctor. Talk with your doctor before you take cough medicine (cough suppressants). Eating and drinking Do not drink alcohol. Do not drink caffeine. Drink enough fluid to keep your pee (urine) pale yellow. Lifestyle Stay away from cigarette  smoke. Do not smoke or use any products that contain nicotine or tobacco. If you need help quitting, ask your doctor. Stay away from things that make you cough. These may include perfume, candles, cleaning products, or campfire smoke. General instructions  Watch for any changes to your cough. Tell your doctor about them. Always cover your mouth when you cough. If Lindsay air is dry in your home, use a cool mist vaporizer or humidifier. If your cough is worse at night, try using extra pillows to raise your head up higher while you sleep. Rest as needed. Contact a doctor if: You have new symptoms. Your symptoms get worse. You cough up pus. You have a fever that does not go away. Your cough does not get better after 2-3 weeks. Cough medicine does not help, and you are not sleeping well. You have pain that gets worse or is not helped with medicine. You are losing weight and do not know why. You have night sweats. Get help right away if: You cough up blood. You have trouble breathing. Your heart is beating very fast. These symptoms may be an emergency. Get help right away. Call 911. Do not wait to see if Lindsay symptoms will go away. Do not drive yourself to Lindsay hospital. This information is not intended to replace advice given to you by your health care provider. Make sure you discuss any questions you have with your health care provider. Document Revised: 08/23/2021 Document Reviewed: 08/23/2021 Elsevier Lindsay [name] Education  2024 Elsevier Inc.     Edwina Barth, MD [facility]

## 2022-08-26 NOTE — Patient Instructions (Addendum)
Continue Augmentin twice a day until finished Chest x-ray today Start Tussionex syrup every 12 hours as needed Continue over-the-counter Mucinex DM Stop Symbicort and start Breztri twice a day Follow-up with PCP in 1 to 2 weeks  Cough, Adult A cough helps to clear your throat and lungs. It may be a sign of an illness or another condition. A short-term (acute) cough may last 2-3 weeks. A long-term (chronic) cough may last 8 or more weeks. Many things can cause a cough. They include: Illnesses such as: An infection in your throat or lungs. Asthma or other heart or lung problems. Gastroesophageal reflux. This is when acid comes back up from your stomach. Breathing in things that bother (irritate) your lungs. Allergies. Postnasal drip. This is when mucus runs down the back of your throat. Smoking. Some medicines. Follow these instructions at home: Medicines Take over-the-counter and prescription medicines only as told by your doctor. Talk with your doctor before you take cough medicine (cough suppressants). Eating and drinking Do not drink alcohol. Do not drink caffeine. Drink enough fluid to keep your pee (urine) pale yellow. Lifestyle Stay away from cigarette smoke. Do not smoke or use any products that contain nicotine or tobacco. If you need help quitting, ask your doctor. Stay away from things that make you cough. These may include perfume, candles, cleaning products, or campfire smoke. General instructions  Watch for any changes to your cough. Tell your doctor about them. Always cover your mouth when you cough. If the air is dry in your home, use a cool mist vaporizer or humidifier. If your cough is worse at night, try using extra pillows to raise your head up higher while you sleep. Rest as needed. Contact a doctor if: You have new symptoms. Your symptoms get worse. You cough up pus. You have a fever that does not go away. Your cough does not get better after 2-3  weeks. Cough medicine does not help, and you are not sleeping well. You have pain that gets worse or is not helped with medicine. You are losing weight and do not know why. You have night sweats. Get help right away if: You cough up blood. You have trouble breathing. Your heart is beating very fast. These symptoms may be an emergency. Get help right away. Call 911. Do not wait to see if the symptoms will go away. Do not drive yourself to the hospital. This information is not intended to replace advice given to you by your health care provider. Make sure you discuss any questions you have with your health care provider. Document Revised: 08/23/2021 Document Reviewed: 08/23/2021 Elsevier Patient Education  2024 [employer].

## 2022-08-26 NOTE — Assessment & Plan Note (Signed)
Cough management discussed Continue over-the-counter Mucinex DM and cough drops Advised to stay well-hydrated Allergic to codeine and morphine. Continue Tessalon 200 mg 3 times a day Needs follow-up with pulmonary doctor and PCP

## 2022-08-26 NOTE — Assessment & Plan Note (Addendum)
Needs to follow-up with pulmonologist Recommend to switch his Symbicort to [facility] twice a day Continue albuterol as needed Continue over-the-counter Mucinex DM Unremarkable chest x-ray

## 2022-08-26 NOTE — Assessment & Plan Note (Signed)
Clinically stable.  No red flag signs or symptoms Benign physical examination Normal EKG and normal chest x-ray Most likely related to persistent cough and may be some degree of pleurisy.  Pain management discussed.

## 2022-08-26 NOTE — Assessment & Plan Note (Signed)
Unremarkable chest x-ray.  No pneumonia. Clinically stable. Continue and finish Augmentin. Advised to rest and stay well-hydrated Most likely viral infection

## 2022-09-09 ENCOUNTER — Encounter: Payer: Self-pay | Admitting: Internal Medicine

## 2022-09-09 ENCOUNTER — Ambulatory Visit (INDEPENDENT_AMBULATORY_CARE_PROVIDER_SITE_OTHER): Payer: 59 | Admitting: Internal Medicine

## 2022-09-09 VITALS — BP 138/84 | HR 79 | Temp 98.0°F | Ht 63.0 in | Wt 202.0 lb

## 2022-09-09 DIAGNOSIS — J42 Unspecified chronic bronchitis: Secondary | ICD-10-CM

## 2022-09-09 DIAGNOSIS — R053 Chronic cough: Secondary | ICD-10-CM | POA: Diagnosis not present

## 2022-09-09 DIAGNOSIS — L309 Dermatitis, unspecified: Secondary | ICD-10-CM

## 2022-09-09 DIAGNOSIS — M1711 Unilateral primary osteoarthritis, right knee: Secondary | ICD-10-CM | POA: Diagnosis not present

## 2022-09-09 DIAGNOSIS — Z23 Encounter for immunization: Secondary | ICD-10-CM | POA: Diagnosis not present

## 2022-09-09 MED ORDER — TIZANIDINE HCL 4 MG PO TABS: 2.0000 mg | ORAL_TABLET | Freq: Three times a day (TID) | ORAL | 1 refills | Status: DC | PRN

## 2022-09-09 MED ORDER — PROMETHAZINE-DM 6.25-15 MG/5ML PO SYRP: 5.0000 mL | ORAL_SOLUTION | Freq: Four times a day (QID) | ORAL | 0 refills | Status: DC | PRN

## 2022-09-09 MED ORDER — GABAPENTIN 300 MG PO CAPS: 300.0000 mg | ORAL_CAPSULE | Freq: Three times a day (TID) | ORAL | 6 refills | Status: DC

## 2022-09-09 MED ORDER — MELOXICAM 7.5 MG PO TABS: 7.5000 mg | ORAL_TABLET | Freq: Every day | ORAL | 3 refills | Status: DC

## 2022-09-09 MED ORDER — ALBUTEROL SULFATE (2.5 MG/3ML) 0.083% IN NEBU: 2.5000 mg | INHALATION_SOLUTION | Freq: Four times a day (QID) | RESPIRATORY_TRACT | 1 refills | Status: DC | PRN

## 2022-09-09 MED ORDER — HYDROXYZINE PAMOATE 25 MG PO CAPS: 25.0000 mg | ORAL_CAPSULE | Freq: Two times a day (BID) | ORAL | 2 refills | Status: DC | PRN

## 2022-09-09 NOTE — Patient Instructions (Signed)
We have sent in the refills and the promethazine/dm cough medicine to use when needed. We have sent in the medicine for the nebulizer for when you need it.

## 2022-09-09 NOTE — Progress Notes (Signed)
   Subjective:   Patient ID: Lindsay [name], female    DOB: 1945-09-06, 77 y.o.   MRN: [id number]  HPI The patient is a 77 YO female coming in for chronic cough which had improved some.Worse since Sunday but overall dramatically improved. Needs some refills.   Review of Systems  Constitutional: Negative.   HENT: Negative.    Eyes: Negative.   Respiratory:  Positive for cough. Negative for chest tightness and shortness of breath.   Cardiovascular:  Negative for chest pain, palpitations and leg swelling.  Gastrointestinal:  Negative for abdominal distention, abdominal pain, constipation, diarrhea, nausea and vomiting.  Musculoskeletal: Negative.   Skin: Negative.   Neurological: Negative.   Psychiatric/Behavioral: Negative.      Objective:  Physical Exam Constitutional:      Appearance: She is well-developed.  HENT:     Head: Normocephalic and atraumatic.     Nose: Congestion and rhinorrhea present.     Mouth/Throat:     Pharynx: Posterior oropharyngeal erythema present.  Cardiovascular:     Rate and Rhythm: Normal rate and regular rhythm.  Pulmonary:     Effort: Pulmonary effort is normal. No respiratory distress.     Breath sounds: Normal breath sounds. No wheezing or rales.  Abdominal:     General: Bowel sounds are normal. There is no distension.     Palpations: Abdomen is soft.     Tenderness: There is no abdominal tenderness. There is no rebound.  Musculoskeletal:     Cervical back: Normal range of motion.  Skin:    General: Skin is warm and dry.  Neurological:     Mental Status: She is alert and oriented to person, place, and time.     Coordination: Coordination normal.     Vitals:   09/09/22 0925  BP: 138/84  Pulse: 79  Temp: 98 F (36.7 C)  TempSrc: Oral  SpO2: 99%  Weight: 202 lb (91.6 kg)  Height: 5\' 3"  (1.6 m)    Assessment & Plan:

## 2022-09-09 NOTE — Assessment & Plan Note (Signed)
She is improving. Tessalon perles did not help. Rx promethazine/dm cough syrup as she has allergies to morphine and codeine and does not want to use tussionex which she was prescribed previously.

## 2022-09-09 NOTE — Assessment & Plan Note (Signed)
Has a lot of itching and needs refill on hydroxyzine which is done for 25 mg BID prn for itching.

## 2022-09-09 NOTE — Assessment & Plan Note (Addendum)
No flare today. She needs refill on albuterol nebulizer solution which is done and using breztri prn. This has helped. She is taking singulair for allergies which is effective. She should continue this year round as she struggles year round with allergies.

## 2022-09-09 NOTE — Assessment & Plan Note (Signed)
Needs refill on meloxican, tizanidine, gabapentin which is done today. She uses these prn and rarely.

## 2022-09-11 DIAGNOSIS — N3020 Other chronic cystitis without hematuria: Secondary | ICD-10-CM | POA: Diagnosis not present

## 2022-09-17 NOTE — Progress Notes (Signed)
Name: Lindsay [name]  MRN/ DOB: [id number], May 15, 1945    Age/ Sex: 77 y.o., female     PCP: Myrlene Broker, MD   Reason for Endocrinology Evaluation: Hypothyroidism     Initial Endocrinology Clinic Visit: 01/11/2019    PATIENT IDENTIFIER: [name] is a 77 y.o., female with a past medical history of HTN, T2DM and Hypothyroidism. She has followed with [facility] since 01/11/2019 for consultative assistance with management of her hypothyroidism.     HISTORICAL SUMMARY: The patient was diagnosed with hypothyroidism in 12/2018 with a TSH 25.71 uIU/mL . Was started on levothyroxine 50 mcg but developed diarrhea that she attributed to LT-4 replacement and stopped it.  She was encouraged to restart it on her initial visit to our clinic   No Fh of thyroid disease   SUBJECTIVE:   Today (09/18/2022):  Lindsay [name] is here for a follow up on hypothyroidism.    Weight stable  She had constipation but this is mild , no diarrhea  NO local neck swelling  Denies palpitations  Denies tremors  Has numbness of the hands  Has hair loss    Levothyroxine 88 mcg daily    HISTORY:  Past Medical History:  Past Medical History:  Diagnosis Date   Acid reflux    Anemia    Arthritis    Asthma    COPD (chronic obstructive pulmonary disease) (HCC)    Diabetes mellitus without complication (HCC)    TYPE 2    Hyperglycemia    Hypertension    Hypokalemia    Hypothyroidism    Obesity    PONV (postoperative nausea and vomiting)    Spinal stenosis    Urinary tract infection, chronic    Past Surgical History:  Past Surgical History:  Procedure Laterality Date   ABDOMINAL HYSTERECTOMY     ANKLE FUSION Left    CHOLECYSTECTOMY     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 Her smoking use included cigarettes. She started smoking about 45 years ago. She has a 10 pack-year smoking history. She has never used smokeless tobacco. She reports that she does not drink alcohol and does not use drugs. Family History:  Family History  Problem Relation Age of Onset   Microcephaly Maternal Grandmother    Heart disease Maternal Grandmother        Not clear details, patient just knows she took "heart pills"   Asthma Daughter      HOME MEDICATIONS: Allergies as of 09/18/2022       Reactions   Codeine Nausea Only   Lisinopril Cough   Morphine And Codeine Itching   Shellfish Allergy Itching        Medication List        Accurate as of September 18, 2022  9:33 AM. If you have any questions, ask your nurse or doctor.          albuterol 108 (90 Base) MCG/ACT inhaler Commonly known as: VENTOLIN HFA INHALE 2 PUFFS INTO THE LUNGS EVERY 6 HOURS AS NEEDED FOR WHEEZING OR SHORTNESS OF BREATH   albuterol (2.5 MG/3ML) 0.083% nebulizer solution Commonly known as: PROVENTIL Take 3 mLs (2.5 mg total) by nebulization every 6 (six) hours as needed for wheezing or shortness of breath.   benzonatate 200  MG capsule Commonly known as: TESSALON Take 1 capsule (200 mg total) by mouth 3 (three) times daily as needed for cough.   blood glucose meter kit and supplies Kit Check blood sugar with glucometer every morning before your first meal   Breztri Aerosphere 160-9-4.8 MCG/ACT Aero Generic drug: Budeson-Glycopyrrol-Formoterol Inhale 2 puffs into the lungs 2 (two) times daily.   cetirizine 10 MG tablet Commonly known as: ZYRTEC Take 1 tablet (10 mg total) by mouth daily. FOR ASTHMA What changed: additional instructions   cyclobenzaprine 10 MG tablet Commonly known as: FLEXERIL Take by mouth.   famotidine 20 MG tablet Commonly known as: Pepcid One after supper   gabapentin 300 MG capsule Commonly known as: NEURONTIN Take 1 capsule (300 mg total) by  mouth 3 (three) times daily.   hydrOXYzine 25 MG capsule Commonly known as: VISTARIL Take 1 capsule (25 mg total) by mouth 2 (two) times daily as needed.   levothyroxine 88 MCG tablet Commonly known as: SYNTHROID Take 1 tablet (88 mcg total) by mouth daily.   losartan 50 MG tablet Commonly known as: COZAAR TAKE 1 TABLET(50 MG) BY MOUTH DAILY   meloxicam 7.5 MG tablet Commonly known as: MOBIC Take 1 tablet (7.5 mg total) by mouth daily.   montelukast 10 MG tablet Commonly known as: SINGULAIR TAKE 1 TABLET(10 MG) BY MOUTH AT BEDTIME   NON FORMULARY DIET- REGULAR CCD   oxybutynin 5 MG tablet Commonly known as: DITROPAN Take 1 tablet (5 mg total) by mouth 2 (two) times daily.   pantoprazole 40 MG tablet Commonly known as: PROTONIX TAKE 1 TABLET(40 MG) BY MOUTH DAILY 30 TO 60 MINUTES BEFORE FIRST MEAL OF THE DAY   promethazine-dextromethorphan 6.25-15 MG/5ML syrup Commonly known as: PROMETHAZINE-DM Take 5 mLs by mouth 4 (four) times daily as needed.   Rybelsus 7 MG Tabs Generic drug: Semaglutide TAKE 1 TABLET BY MOUTH DAILY   SALONPAS PAIN RELIEF PATCH EX Place 1 patch onto the skin daily as needed (pain).   simvastatin 40 MG tablet Commonly known as: ZOCOR TAKE 1 TABLET(40 MG) BY MOUTH DAILY AT 6 PM   tiZANidine 4 MG tablet Commonly known as: ZANAFLEX Take 0.5-1 tablets (2-4 mg total) by mouth every 8 (eight) hours as needed for muscle spasms.   Vitamin D3 50 MCG (2000 UT) capsule Take 1 capsule (2,000 Units total) by mouth daily. Must keep scheduled follow=-up appt for future refills          OBJECTIVE:   PHYSICAL EXAM: VS: BP 124/72 (BP Location: Left Arm, Patient Position: Sitting, Cuff Size: Large)   Pulse (!) 58   Ht 5\' 3"  (1.6 m)   Wt 207 lb (93.9 kg)   SpO2 99%   BMI 36.67 kg/m    EXAM: General: Pt appears well and is in NAD  Neck: General: Supple without adenopathy. Thyroid: Thyroid size normal.  No goiter or nodules appreciated.    Lungs: Clear with good BS bilat   Heart: Auscultation: RRR.  Extremities:  BL LE: No pretibial edema normal   Mental Status: Judgment, insight: Intact Mood and affect: No depression, anxiety, or agitation     DATA REVIEWED:   Latest Reference Range & Units 09/18/22 10:03  TSH 0.35 - 5.50 uIU/mL 5.62 (H)  (H): Data is abnormally high  ASSESSMENT / PLAN / RECOMMENDATIONS:   Hypothyroidism:   -Constipation has improved with increasing levothyroxine - No local neck symptoms - TSH continues to be elevated despite escalating the dose of levothyroxine, patient  assures me compliance -- Pt educated extensively on the correct way to take levothyroxine (first thing in the morning with water, 30 minutes before eating or taking other medications). - Pt encouraged to double dose the following day if she were to miss a dose given long half-life of levothyroxine. -Will increase levothyroxine as below  Medications   Stop levothyroxine 88 mcg daily Start start levothyroxine 100 mcg daily  2. Hair loss:  -We discussed differential diagnosis to include thyroid disease, hereditary causes, stress or other medications -I did offer biotin, but the patient is reluctant as she is not going to be able to remember to hold the 2-3 days before any future thyroid workup -I have recommended Rosemary oil  F/u in 6 months   Signed electronically by: Lyndle Herrlich, MD  [facility]  [facility] [address] [place] [place], [place] [address] Phone: [phone] FAX: [phone]      CC: Myrlene Broker, MD [address] [place] [address] Phone: [phone]  Fax: [phone]   Return to Endocrinology clinic as below: Future Appointments  Date Time Provider Department Center  12/09/2022  2:20 PM Myrlene Broker, MD [facility] None  03/16/2023  2:30 PM [facility]NUAL WELLNESS VISIT [facility] None

## 2022-09-18 ENCOUNTER — Encounter: Payer: Self-pay | Admitting: Internal Medicine

## 2022-09-18 ENCOUNTER — Ambulatory Visit (INDEPENDENT_AMBULATORY_CARE_PROVIDER_SITE_OTHER): Payer: 59 | Admitting: Internal Medicine

## 2022-09-18 VITALS — BP 124/72 | HR 58 | Ht 63.0 in | Wt 207.0 lb

## 2022-09-18 DIAGNOSIS — L659 Nonscarring hair loss, unspecified: Secondary | ICD-10-CM | POA: Diagnosis not present

## 2022-09-18 DIAGNOSIS — E039 Hypothyroidism, unspecified: Secondary | ICD-10-CM | POA: Diagnosis not present

## 2022-09-18 LAB — TSH: TSH: 5.62 u[IU]/mL — ABNORMAL HIGH (ref 0.35–5.50)

## 2022-09-18 NOTE — Patient Instructions (Addendum)

## 2022-09-19 ENCOUNTER — Telehealth: Payer: Self-pay | Admitting: Internal Medicine

## 2022-09-19 MED ORDER — LEVOTHYROXINE SODIUM 100 MCG PO TABS: 100.0000 ug | ORAL_TABLET | Freq: Every day | ORAL | 3 refills | Status: DC

## 2022-09-19 NOTE — Telephone Encounter (Signed)
Patient aware and will make medication changes

## 2022-09-19 NOTE — Telephone Encounter (Signed)
Please let the patient know that her thyroid test continues to show that she is not on enough levothyroxine, this explains why she is having hair loss.   I have increased levothyroxine from 88 mcg to 100 mcg.  She may continue the ones that she has now but eventually she will go to the higher dose   Thanks

## 2022-10-08 ENCOUNTER — Other Ambulatory Visit: Payer: Self-pay | Admitting: Internal Medicine

## 2022-10-10 ENCOUNTER — Other Ambulatory Visit: Payer: Self-pay

## 2022-10-10 ENCOUNTER — Telehealth: Payer: Self-pay | Admitting: Internal Medicine

## 2022-10-10 MED ORDER — PROMETHAZINE-DM 6.25-15 MG/5ML PO SYRP: 5.0000 mL | ORAL_SOLUTION | Freq: Four times a day (QID) | ORAL | 0 refills | Status: DC | PRN

## 2022-10-10 NOTE — Telephone Encounter (Signed)
Prescription Request  10/10/2022  LOV: 09/09/2022  What is the name of the medication or equipment?  promethazine-dextromethorphan (PROMETHAZINE-DM) 6.25-15 MG/5ML syrup    Have you contacted your pharmacy to request a refill? No   Which pharmacy would you like this sent to?    [id number] - [place], [place] - [address] AT [address] [place] [address] Phone: [phone] Fax: [phone]   Patient notified that their request is being sent to the clinical staff for review and that they should receive a response within 2 business days.   Please advise at Mobile [phone] (mobile)

## 2022-10-13 ENCOUNTER — Encounter: Payer: Self-pay | Admitting: Internal Medicine

## 2022-10-13 ENCOUNTER — Ambulatory Visit (INDEPENDENT_AMBULATORY_CARE_PROVIDER_SITE_OTHER): Payer: 59 | Admitting: Internal Medicine

## 2022-10-13 VITALS — BP 126/82 | HR 84 | Temp 98.6°F | Ht 63.0 in | Wt 203.0 lb

## 2022-10-13 DIAGNOSIS — E118 Type 2 diabetes mellitus with unspecified complications: Secondary | ICD-10-CM | POA: Diagnosis not present

## 2022-10-13 DIAGNOSIS — N390 Urinary tract infection, site not specified: Secondary | ICD-10-CM | POA: Diagnosis not present

## 2022-10-13 DIAGNOSIS — J42 Unspecified chronic bronchitis: Secondary | ICD-10-CM | POA: Diagnosis not present

## 2022-10-13 DIAGNOSIS — R053 Chronic cough: Secondary | ICD-10-CM

## 2022-10-13 DIAGNOSIS — Z7984 Long term (current) use of oral hypoglycemic drugs: Secondary | ICD-10-CM | POA: Diagnosis not present

## 2022-10-13 DIAGNOSIS — I10 Essential (primary) hypertension: Secondary | ICD-10-CM | POA: Diagnosis not present

## 2022-10-13 MED ORDER — PANTOPRAZOLE SODIUM 40 MG PO TBEC: 40.0000 mg | DELAYED_RELEASE_TABLET | Freq: Every day | ORAL | 3 refills | Status: DC

## 2022-10-13 MED ORDER — NITROFURANTOIN MONOHYD MACRO 100 MG PO CAPS: 100.0000 mg | ORAL_CAPSULE | Freq: Two times a day (BID) | ORAL | 0 refills | Status: DC

## 2022-10-13 MED ORDER — BUDESONIDE-FORMOTEROL FUMARATE 80-4.5 MCG/ACT IN AERO: 2.0000 | INHALATION_SPRAY | Freq: Two times a day (BID) | RESPIRATORY_TRACT | 3 refills | Status: DC

## 2022-10-13 NOTE — Progress Notes (Signed)
Subjective:   Patient ID: Lindsay [name], female    DOB: [birth year], 77 y.o.   MRN: [id number]  HPI The patient is a 77 YO female coming in for follow up and ongoing cough. Not taking protonix or pepcid lately did feel she was taking too many medicines. Cough ongoing for months. Tessalon perles not effective. Is taking zyrtec and trying to remember singulair.   Review of Systems  Constitutional: Negative.   HENT: Negative.    Eyes: Negative.   Respiratory:  Positive for cough. Negative for chest tightness and shortness of breath.   Cardiovascular:  Negative for chest pain, palpitations and leg swelling.  Gastrointestinal:  Negative for abdominal distention, abdominal pain, constipation, diarrhea, nausea and vomiting.  Musculoskeletal: Negative.   Skin: Negative.   Neurological: Negative.   Psychiatric/Behavioral: Negative.      Objective:  Physical Exam Constitutional:      Appearance: She is well-developed.  HENT:     Head: Normocephalic and atraumatic.  Cardiovascular:     Rate and Rhythm: Normal rate and regular rhythm.  Pulmonary:     Effort: Pulmonary effort is normal. No respiratory distress.     Breath sounds: Normal breath sounds. No wheezing or rales.  Abdominal:     General: Bowel sounds are normal. There is no distension.     Palpations: Abdomen is soft.     Tenderness: There is no abdominal tenderness. There is no rebound.  Musculoskeletal:     Cervical back: Normal range of motion.  Skin:    General: Skin is warm and dry.     Comments: Foot exam done  Neurological:     Mental Status: She is alert and oriented to person, place, and time.     Coordination: Coordination normal.     Vitals:   10/13/22 0948 10/13/22 0950 10/13/22 1015  BP: (!) 128/100 (!) 128/100 126/82  Pulse: 84    Temp: 98.6 F (37 C)    TempSrc: Oral    SpO2: 97%    Weight: 203 lb (92.1 kg)    Height: 5\' 3"  (1.6 m)      Assessment & Plan:

## 2022-10-13 NOTE — Patient Instructions (Addendum)
Stop the breztri and we can try symbicort again if you want.  Start taking protonix (pantoprazole) daily to help the acid reflux which is causing the cough.  Make sure to take zyrtec and montelukast (singulair) for the allergies.

## 2022-10-13 NOTE — Assessment & Plan Note (Addendum)
Foot exam done. Checking HgA1c and CMP today. Adjust rybelsus 7 mg daily as needed. On ARB and statin.

## 2022-10-13 NOTE — Assessment & Plan Note (Signed)
She is unsure if she likes breztri. Will rx symbicort instead which she has liked better.

## 2022-10-13 NOTE — Assessment & Plan Note (Signed)
She is off her GERD medication. Back in 2022 assessment with pulmonary this was likely cause. Asked her to resume protonix 40 mg daily and new rx done. Will remove pepcid since she is not taking this.

## 2022-10-13 NOTE — Assessment & Plan Note (Signed)
Renewed macrobid which she uses at onset of symptoms when needed.

## 2022-10-13 NOTE — Assessment & Plan Note (Signed)
BP at goal on recheck. Checking CMP and adjust as needed her losartan 50 mg daily as needed.

## 2022-10-28 ENCOUNTER — Ambulatory Visit: Payer: 59 | Admitting: Internal Medicine

## 2022-11-07 ENCOUNTER — Other Ambulatory Visit: Payer: Self-pay | Admitting: Internal Medicine

## 2022-11-07 DIAGNOSIS — Z1231 Encounter for screening mammogram for malignant neoplasm of breast: Secondary | ICD-10-CM

## 2022-12-09 ENCOUNTER — Encounter: Payer: Self-pay | Admitting: Internal Medicine

## 2022-12-09 ENCOUNTER — Ambulatory Visit: Payer: 59 | Admitting: Internal Medicine

## 2022-12-09 VITALS — BP 142/80 | HR 73 | Temp 98.5°F | Ht 63.0 in | Wt 208.0 lb

## 2022-12-09 DIAGNOSIS — R053 Chronic cough: Secondary | ICD-10-CM | POA: Diagnosis not present

## 2022-12-09 DIAGNOSIS — Z7984 Long term (current) use of oral hypoglycemic drugs: Secondary | ICD-10-CM | POA: Diagnosis not present

## 2022-12-09 DIAGNOSIS — E118 Type 2 diabetes mellitus with unspecified complications: Secondary | ICD-10-CM

## 2022-12-09 DIAGNOSIS — J441 Chronic obstructive pulmonary disease with (acute) exacerbation: Secondary | ICD-10-CM

## 2022-12-09 LAB — CBC
HCT: 32.4 % — ABNORMAL LOW (ref 36.0–46.0)
Hemoglobin: 10.4 g/dL — ABNORMAL LOW (ref 12.0–15.0)
MCHC: 32.2 g/dL (ref 30.0–36.0)
MCV: 82.6 fL (ref 78.0–100.0)
Platelets: 292 10*3/uL (ref 150.0–400.0)
RBC: 3.92 Mil/uL (ref 3.87–5.11)
RDW: 14.4 % (ref 11.5–15.5)
WBC: 8.1 10*3/uL (ref 4.0–10.5)

## 2022-12-09 LAB — COMPREHENSIVE METABOLIC PANEL
ALT: 14 U/L (ref 0–35)
AST: 17 U/L (ref 0–37)
Albumin: 3.9 g/dL (ref 3.5–5.2)
Alkaline Phosphatase: 51 U/L (ref 39–117)
BUN: 25 mg/dL — ABNORMAL HIGH (ref 6–23)
CO2: 29 meq/L (ref 19–32)
Calcium: 9.6 mg/dL (ref 8.4–10.5)
Chloride: 110 meq/L (ref 96–112)
Creatinine, Ser: 0.91 mg/dL (ref 0.40–1.20)
GFR: 61.74 mL/min (ref >60.00)
Glucose, Bld: 85 mg/dL (ref 70–99)
Potassium: 3.6 meq/L (ref 3.5–5.1)
Sodium: 144 meq/L (ref 135–145)
Total Bilirubin: 0.3 mg/dL (ref 0.2–1.2)
Total Protein: 7 g/dL (ref 6.0–8.3)

## 2022-12-09 LAB — HEMOGLOBIN A1C: Hgb A1c MFr Bld: 6.2 % (ref 4.6–6.5)

## 2022-12-09 MED ORDER — AZITHROMYCIN 250 MG PO TABS: ORAL_TABLET | ORAL | 0 refills | Status: AC

## 2022-12-09 MED ORDER — PREDNISONE 20 MG PO TABS: 40.0000 mg | ORAL_TABLET | Freq: Every day | ORAL | 0 refills | Status: AC

## 2022-12-09 MED ORDER — BENZONATATE 200 MG PO CAPS: 200.0000 mg | ORAL_CAPSULE | Freq: Three times a day (TID) | ORAL | 6 refills | Status: DC | PRN

## 2022-12-09 NOTE — Patient Instructions (Addendum)
We have sent in the tessalon perles for cough to take up to 3 times a day.  We have sent in prednisone to take 2 pills daily for 4 days. We have also sent in azithromycin to take 2 pills on day 1 then days 2-5 take 1 pill daily.

## 2022-12-09 NOTE — Progress Notes (Unsigned)
   Subjective:   Patient ID: Lindsay [name], female    DOB: 12-16-1945, 77 y.o.   MRN: [id number]  HPI The patient is a 77 YO female coming in for cough. She did start pantoprazole and this helped some. Then the cough returned about 2-3 weeks ago. She has had some wheezing at night time. Is coughing up sputum. She is struggling to sleep and chest wall hurting from coughing.   Review of Systems  Constitutional: Negative.   HENT: Negative.    Eyes: Negative.   Respiratory:  Positive for cough, shortness of breath and wheezing. Negative for chest tightness.   Cardiovascular:  Negative for chest pain, palpitations and leg swelling.  Gastrointestinal:  Negative for abdominal distention, abdominal pain, constipation, diarrhea, nausea and vomiting.  Musculoskeletal: Negative.   Skin: Negative.   Neurological: Negative.   Psychiatric/Behavioral: Negative.      Objective:  Physical Exam Constitutional:      Appearance: She is well-developed.  HENT:     Head: Normocephalic and atraumatic.  Cardiovascular:     Rate and Rhythm: Normal rate and regular rhythm.  Pulmonary:     Effort: Pulmonary effort is normal. No respiratory distress.     Breath sounds: Wheezing present. No rales.  Abdominal:     General: Bowel sounds are normal. There is no distension.     Palpations: Abdomen is soft.     Tenderness: There is no abdominal tenderness. There is no rebound.  Musculoskeletal:     Cervical back: Normal range of motion.  Skin:    General: Skin is warm and dry.  Neurological:     Mental Status: She is alert and oriented to person, place, and time.     Coordination: Coordination normal.     Vitals:   12/09/22 1345 12/09/22 1352  BP: (!) 142/80 (!) 142/80  Pulse: 73   Temp: 98.5 F (36.9 C)   TempSrc: Oral   SpO2: 98%   Weight: 208 lb (94.3 kg)   Height: 5\' 3"  (1.6 m)     Assessment & Plan:

## 2022-12-11 NOTE — Assessment & Plan Note (Signed)
With flare today and rx prednisone 5 day course and azithromcyin 5 day course. Continue symbicort and albuterol prn. She is currently using nebulizer BID and can continue. In next few days move to prn if able. If no improvement or worsening she will return or call.

## 2022-12-11 NOTE — Assessment & Plan Note (Signed)
Did have improvement with addition of PPI so suspect GERD was playing a role in her chronic symptoms. Suspect she has acute COPD flare now with wheezing on exam and productive cough we will cover that and then reassess. If no improvement she would benefit from pulmonary referral to help [place] with etiology.

## 2022-12-11 NOTE — Assessment & Plan Note (Signed)
Due for HgA1c, checking CMP and CBC as well. Adjust rybelsus 7 mg daily as needed.

## 2022-12-15 ENCOUNTER — Telehealth: Payer: Self-pay | Admitting: Internal Medicine

## 2022-12-15 DIAGNOSIS — J441 Chronic obstructive pulmonary disease with (acute) exacerbation: Secondary | ICD-10-CM

## 2022-12-15 NOTE — Telephone Encounter (Signed)
Patient called and said that her nebulizer stopped working last night.  She needs an order for a new nebulizer with tubing and mask.  Please call patient back and let know when it is sent.  [phone]

## 2022-12-16 DIAGNOSIS — N3020 Other chronic cystitis without hematuria: Secondary | ICD-10-CM | POA: Diagnosis not present

## 2022-12-16 NOTE — Telephone Encounter (Signed)
Patient called back again.

## 2022-12-16 NOTE — Telephone Encounter (Signed)
I have sent a message to adapt team

## 2022-12-16 NOTE — Telephone Encounter (Signed)
Order placed. Okay to let adapt know

## 2022-12-17 DIAGNOSIS — H04123 Dry eye syndrome of bilateral lacrimal glands: Secondary | ICD-10-CM | POA: Diagnosis not present

## 2022-12-17 DIAGNOSIS — H0102B Squamous blepharitis left eye, upper and lower eyelids: Secondary | ICD-10-CM | POA: Diagnosis not present

## 2022-12-17 DIAGNOSIS — E119 Type 2 diabetes mellitus without complications: Secondary | ICD-10-CM | POA: Diagnosis not present

## 2022-12-17 DIAGNOSIS — H43813 Vitreous degeneration, bilateral: Secondary | ICD-10-CM | POA: Diagnosis not present

## 2022-12-17 DIAGNOSIS — H1045 Other chronic allergic conjunctivitis: Secondary | ICD-10-CM | POA: Diagnosis not present

## 2022-12-17 DIAGNOSIS — H0102A Squamous blepharitis right eye, upper and lower eyelids: Secondary | ICD-10-CM | POA: Diagnosis not present

## 2022-12-17 DIAGNOSIS — H2513 Age-related nuclear cataract, bilateral: Secondary | ICD-10-CM | POA: Diagnosis not present

## 2022-12-17 LAB — HM DIABETES EYE EXAM

## 2022-12-19 ENCOUNTER — Ambulatory Visit
Admission: RE | Admit: 2022-12-19 | Discharge: 2022-12-19 | Disposition: A | Discharge status: Home / Self Care | Payer: 59 | Source: Ambulatory Visit | Attending: Internal Medicine | Admitting: Internal Medicine

## 2022-12-19 ENCOUNTER — Other Ambulatory Visit: Payer: Self-pay | Admitting: Internal Medicine

## 2022-12-19 DIAGNOSIS — J441 Chronic obstructive pulmonary disease with (acute) exacerbation: Secondary | ICD-10-CM | POA: Diagnosis not present

## 2022-12-19 DIAGNOSIS — Z1231 Encounter for screening mammogram for malignant neoplasm of breast: Secondary | ICD-10-CM

## 2022-12-22 LAB — HM MAMMOGRAPHY

## 2022-12-23 ENCOUNTER — Encounter: Payer: Self-pay | Admitting: Internal Medicine

## 2023-01-31 ENCOUNTER — Other Ambulatory Visit: Payer: Self-pay | Admitting: Emergency Medicine

## 2023-01-31 DIAGNOSIS — J22 Unspecified acute lower respiratory infection: Secondary | ICD-10-CM

## 2023-02-02 ENCOUNTER — Other Ambulatory Visit: Payer: Self-pay | Admitting: Internal Medicine

## 2023-02-07 DIAGNOSIS — J441 Chronic obstructive pulmonary disease with (acute) exacerbation: Secondary | ICD-10-CM | POA: Diagnosis not present

## 2023-02-13 ENCOUNTER — Other Ambulatory Visit: Payer: Self-pay | Admitting: Internal Medicine

## 2023-02-13 DIAGNOSIS — J22 Unspecified acute lower respiratory infection: Secondary | ICD-10-CM

## 2023-02-13 DIAGNOSIS — I10 Essential (primary) hypertension: Secondary | ICD-10-CM

## 2023-02-13 DIAGNOSIS — E1169 Type 2 diabetes mellitus with other specified complication: Secondary | ICD-10-CM

## 2023-02-13 MED ORDER — TIZANIDINE HCL 4 MG PO TABS: 2.0000 mg | ORAL_TABLET | Freq: Three times a day (TID) | ORAL | 1 refills | Status: DC | PRN

## 2023-02-13 MED ORDER — SIMVASTATIN 40 MG PO TABS: 40.0000 mg | ORAL_TABLET | Freq: Every day | ORAL | 0 refills | Status: DC

## 2023-02-13 MED ORDER — MONTELUKAST SODIUM 10 MG PO TABS: ORAL_TABLET | ORAL | 0 refills | Status: DC

## 2023-02-13 MED ORDER — LOSARTAN POTASSIUM 50 MG PO TABS: 50.0000 mg | ORAL_TABLET | Freq: Every day | ORAL | 0 refills | Status: DC

## 2023-02-13 NOTE — Telephone Encounter (Signed)
 Copied from CRM [id number]. Topic: Clinical - Medication Refill >> Feb 13, 2023 10:54 AM Annabella DEL wrote: Most Recent Primary Care Visit:  Provider: ROLLENE NORRIS A  Department: [facility]  Visit Type: OFFICE VISIT  Date: 12/09/2022  Medication: montelukast  10mg  - URGENT.   simvastatin  (ZOCOR ) 40 MG tablet tiZANidine  (ZANAFLEX ) 4 MG tablet losartan  (COZAAR ) 50 MG tablet  Has the patient contacted their pharmacy?  (Agent: If no, request that the patient contact the pharmacy for the refill. If patient does not wish to contact the pharmacy document the reason why and proceed with request.) (Agent: If yes, when and what did the pharmacy advise?)  Is this the correct pharmacy for this prescription?  If no, delete pharmacy and type the correct one.  This is the patient's preferred pharmacy:  [facility] #[id number] [facility], [place] - [address] [place] [address] Phone: [phone] Fax: [phone]  [facility] #[id number] [place], [place] - [address] AT [facility] [address] [facility] [address] [place] [address] Phone: [phone] Fax: [phone]   Has the prescription been filled recently?   Is the patient out of the medication?   Has the patient been seen for an appointment in the last year OR does the patient have an upcoming appointment?   Can we respond through MyChart?   Agent: Please be advised that Rx refills may take up to 3 business days. We ask that you follow-up with your pharmacy.

## 2023-03-10 ENCOUNTER — Ambulatory Visit: Payer: 59 | Admitting: Internal Medicine

## 2023-03-10 ENCOUNTER — Encounter: Payer: Self-pay | Admitting: Internal Medicine

## 2023-03-10 VITALS — BP 120/80 | HR 79 | Temp 97.9°F | Ht 63.0 in | Wt 196.0 lb

## 2023-03-10 DIAGNOSIS — M545 Low back pain, unspecified: Secondary | ICD-10-CM | POA: Diagnosis not present

## 2023-03-10 DIAGNOSIS — Z7984 Long term (current) use of oral hypoglycemic drugs: Secondary | ICD-10-CM

## 2023-03-10 DIAGNOSIS — E785 Hyperlipidemia, unspecified: Secondary | ICD-10-CM | POA: Diagnosis not present

## 2023-03-10 DIAGNOSIS — J410 Simple chronic bronchitis: Secondary | ICD-10-CM | POA: Diagnosis not present

## 2023-03-10 DIAGNOSIS — E118 Type 2 diabetes mellitus with unspecified complications: Secondary | ICD-10-CM

## 2023-03-10 DIAGNOSIS — E1169 Type 2 diabetes mellitus with other specified complication: Secondary | ICD-10-CM

## 2023-03-10 LAB — POCT GLYCOSYLATED HEMOGLOBIN (HGB A1C): HbA1c POC (<> result, manual entry): 5.6 % (ref 4.0–5.6)

## 2023-03-10 MED ORDER — RYBELSUS 7 MG PO TABS: 1.0000 | ORAL_TABLET | Freq: Every day | ORAL | 3 refills | Status: DC

## 2023-03-10 MED ORDER — MELOXICAM 15 MG PO TABS: 15.0000 mg | ORAL_TABLET | Freq: Every day | ORAL | 1 refills | Status: DC

## 2023-03-10 NOTE — Assessment & Plan Note (Signed)
 Sugars are checked today with POC HgA1c which is improved. Weight is improving with rybelsus and we will continue. She is working on weight loss to help with osteoarthritis to avoid another knee replacement.

## 2023-03-10 NOTE — Assessment & Plan Note (Addendum)
 Due to fall and no indication for imaging today. Will monitor and use voltaren and lidocaine otc for pain. Increase meloxicam dose to 15 mg daily to help with pain better new rx done.

## 2023-03-10 NOTE — Assessment & Plan Note (Signed)
 No flare today and we discussed avoiding crowds due to flu exposure risk high currently.

## 2023-03-10 NOTE — Patient Instructions (Addendum)
 Your HgA1c is 5.6 today which is great.  We will increase the dose of the meloxicam to help more with the arthritis pain.

## 2023-03-10 NOTE — Progress Notes (Unsigned)
   Subjective:   Patient ID: Lindsay [name], female    DOB: 07-26-1945, 78 y.o.   MRN: [id number]  HPI The patient is a 78 YO female coming in for follow up diabetes (still taking rybelsus and weight down about 8 pounds since December) and also fall recently and hurt low back and wrist. This happened about 1 week ago.   Review of Systems  Constitutional: Negative.   HENT: Negative.    Eyes: Negative.   Respiratory:  Negative for cough, chest tightness and shortness of breath.   Cardiovascular:  Negative for chest pain, palpitations and leg swelling.  Gastrointestinal:  Negative for abdominal distention, abdominal pain, constipation, diarrhea, nausea and vomiting.  Musculoskeletal:  Positive for arthralgias and myalgias.  Skin: Negative.   Neurological: Negative.   Psychiatric/Behavioral: Negative.      Objective:  Physical Exam Constitutional:      Appearance: She is well-developed.  HENT:     Head: Normocephalic and atraumatic.  Cardiovascular:     Rate and Rhythm: Normal rate and regular rhythm.  Pulmonary:     Effort: Pulmonary effort is normal. No respiratory distress.     Breath sounds: Normal breath sounds. No wheezing or rales.  Abdominal:     General: Bowel sounds are normal. There is no distension.     Palpations: Abdomen is soft.     Tenderness: There is no abdominal tenderness. There is no rebound.  Musculoskeletal:        General: Tenderness present.     Cervical back: Normal range of motion.  Skin:    General: Skin is warm and dry.  Neurological:     Mental Status: She is alert and oriented to person, place, and time.     Coordination: Coordination normal.     Vitals:   03/10/23 1041  BP: 120/80  Pulse: 79  Temp: 97.9 F (36.6 C)  TempSrc: Oral  SpO2: 99%  Weight: 196 lb (88.9 kg)  Height: 5\' 3"  (1.6 m)    Assessment & Plan:

## 2023-03-12 NOTE — Assessment & Plan Note (Signed)
 Taking simvastatin 40 mg daily and continue. Checking lipid panel when due.

## 2023-03-16 ENCOUNTER — Ambulatory Visit (INDEPENDENT_AMBULATORY_CARE_PROVIDER_SITE_OTHER): Payer: 59

## 2023-03-16 VITALS — Ht 63.0 in | Wt 196.0 lb

## 2023-03-16 DIAGNOSIS — Z0000 Encounter for general adult medical examination without abnormal findings: Secondary | ICD-10-CM | POA: Diagnosis not present

## 2023-03-16 NOTE — Progress Notes (Signed)
 Subjective:   Lindsay [name] is a 78 y.o. who presents for a Medicare Wellness preventive visit.  Visit Complete: Virtual I connected with  Athena Masse on 03/16/23 by a audio enabled telemedicine application and verified that I am speaking with the correct person using two identifiers.  Patient Location: Home  Provider Location: Home Office  I discussed the limitations of evaluation and management by telemedicine. The patient expressed understanding and agreed to proceed.  Vital Signs: Because this visit was a virtual/telehealth visit, some criteria may be missing or patient reported. Any vitals not documented were not able to be obtained and vitals that have been documented are patient reported.  VideoDeclined- This patient declined Librarian, academic. Therefore the visit was completed with audio only.  AWV Questionnaire: No: Patient Medicare AWV questionnaire was not completed prior to this visit.  Cardiac Risk Factors include: advanced age (>16men, >42 women);hypertension;Other (see comment);diabetes mellitus;dyslipidemia, Risk factor comments: COPD     Objective:    Today's Vitals   03/16/23 1420  Weight: 196 lb (88.9 kg)  Height: 5\' 3"  (1.6 m)   Body mass index is 34.72 kg/m.     03/16/2023    2:32 PM 03/13/2022    2:14 PM 01/28/2021    2:00 PM 09/30/2019    5:43 AM 09/21/2019    9:17 AM 09/02/2019    9:52 AM 07/28/2019    8:59 AM  Advanced Directives  Does Patient Have a Medical Advance Directive? No No No No No No Yes  Type of Advance Directive       --  Does patient want to make changes to medical advance directive?       No - Patient declined  Would patient like information on creating a medical advance directive?  No - Patient declined No - Patient declined No - Patient declined  No - Patient declined     Current Medications (verified) Outpatient Encounter Medications as of 03/16/2023  Medication Sig   albuterol (PROVENTIL) (2.5  MG/3ML) 0.083% nebulizer solution Take 3 mLs (2.5 mg total) by nebulization every 6 (six) hours as needed for wheezing or shortness of breath.   albuterol (VENTOLIN HFA) 108 (90 Base) MCG/ACT inhaler INHALE 2 PUFFS INTO THE LUNGS EVERY 6 HOURS AS NEEDED FOR WHEEZING OR SHORTNESS OF BREATH   benzonatate (TESSALON) 200 MG capsule Take 1 capsule (200 mg total) by mouth 3 (three) times daily as needed.   blood glucose meter kit and supplies KIT Check blood sugar with glucometer every morning before your first meal   budesonide-formoterol (SYMBICORT) 80-4.5 MCG/ACT inhaler Inhale 2 puffs into the lungs 2 (two) times daily.   cetirizine (ZYRTEC) 10 MG tablet Take 1 tablet (10 mg total) by mouth daily. FOR ASTHMA (Patient taking differently: Take 10 mg by mouth daily.)   Cholecalciferol (VITAMIN D3) 50 MCG (2000 UT) capsule Take 1 capsule (2,000 Units total) by mouth daily. Must keep scheduled follow=-up appt for future refills   cyclobenzaprine (FLEXERIL) 10 MG tablet Take by mouth.   gabapentin (NEURONTIN) 300 MG capsule Take 1 capsule (300 mg total) by mouth 3 (three) times daily.   hydrOXYzine (VISTARIL) 25 MG capsule Take 1 capsule (25 mg total) by mouth 2 (two) times daily as needed.   levothyroxine (SYNTHROID) 100 MCG tablet Take 1 tablet (100 mcg total) by mouth daily.   losartan (COZAAR) 50 MG tablet Take 1 tablet (50 mg total) by mouth daily. TAKE 1 TABLET(50 MG) BY MOUTH DAILY  meloxicam (MOBIC) 15 MG tablet Take 1 tablet (15 mg total) by mouth daily.   Menthol-Methyl Salicylate (SALONPAS PAIN RELIEF PATCH EX) Place 1 patch onto the skin daily as needed (pain).   montelukast (SINGULAIR) 10 MG tablet TAKE 1 TABLET(10 MG) BY MOUTH AT BEDTIME   nitrofurantoin, macrocrystal-monohydrate, (MACROBID) 100 MG capsule Take 1 capsule (100 mg total) by mouth 2 (two) times daily.   NON FORMULARY DIET- REGULAR CCD   oxybutynin (DITROPAN) 5 MG tablet Take 1 tablet (5 mg total) by mouth 2 (two) times daily.    pantoprazole (PROTONIX) 40 MG tablet Take 1 tablet (40 mg total) by mouth daily.   promethazine-dextromethorphan (PROMETHAZINE-DM) 6.25-15 MG/5ML syrup Take 5 mLs by mouth 4 (four) times daily as needed.   Semaglutide (RYBELSUS) 7 MG TABS Take 1 tablet (7 mg total) by mouth daily.   simvastatin (ZOCOR) 40 MG tablet Take 1 tablet (40 mg total) by mouth daily at 6 PM. TAKE 1 TABLET(40 MG) BY MOUTH DAILY AT 6 PM   tiZANidine (ZANAFLEX) 4 MG tablet Take 0.5-1 tablets (2-4 mg total) by mouth every 8 (eight) hours as needed for muscle spasms.   No facility-administered encounter medications on file as of 03/16/2023.    Allergies (verified) Codeine, Lisinopril, Morphine and codeine, and Shellfish allergy   History: Past Medical History:  Diagnosis Date   Acid reflux    Anemia    Arthritis    Asthma    COPD (chronic obstructive pulmonary disease) (HCC)    Diabetes mellitus without complication (HCC)    TYPE 2    Hyperglycemia    Hypertension    Hypokalemia    Hypothyroidism    Obesity    PONV (postoperative nausea and vomiting)    Spinal stenosis    Urinary tract infection, chronic    Past Surgical History:  Procedure Laterality Date   ABDOMINAL HYSTERECTOMY     ANKLE FUSION Left    CHOLECYSTECTOMY     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   Procedure: TOTAL KNEE ARTHROPLASTY;  Surgeon: Jodi Geralds, MD;  Location: [facility];  Service: Orthopedics;  Laterality: Right;   Family History  Problem Relation Age of Onset   Asthma Daughter    Microcephaly Maternal Grandmother    Heart disease Maternal Grandmother        Not clear details, patient just knows she took "heart pills"   BRCA 1/2 Neg Hx    Breast cancer Neg Hx    Social History   Socioeconomic History   Marital status: Single    Spouse name: Not on file   Number of children: 1   Years of education: 12   Highest education level: Not on file  Occupational History   Occupation: RETIRED  Tobacco Use   Smoking status:  Former    Current packs/day: 0.00    Average packs/day: 0.5 packs/day for 20.0 years (10.0 ttl pk-yrs)    Types: Cigarettes    Start date: 01/06/1977    Quit date: 01/06/1997    Years since quitting: 26.2   Smokeless tobacco: Never  Vaping Use   Vaping status: Never Used  Substance and Sexual Activity   Alcohol use: No   Drug use: No   Sexual activity: Not Currently  Other Topics Concern   Not on file  Social History Narrative   Lives alone   Social Drivers of Health   Financial Resource Strain: Low Risk  (03/13/2022)   Overall Financial Resource Strain (CARDIA)    Difficulty  of Paying Living Expenses: Not hard at all  Food Insecurity: No Food Insecurity (03/13/2022)   Hunger Vital Sign    Worried About Running Out of Food in the Last Year: Never true    Ran Out of Food in the Last Year: Never true  Transportation Needs: No Transportation Needs (03/16/2023)   PRAPARE - Administrator, Civil Service (Medical): No    Lack of Transportation (Non-Medical): No  Physical Activity: Inactive (03/16/2023)   Exercise Vital Sign    Days of Exercise per Week: 0 days    Minutes of Exercise per Session: 0 min  Stress: No Stress Concern Present (03/16/2023)   [employer] of Occupational Health - Occupational Stress Questionnaire    Feeling of Stress : Not at all  Social Connections: Moderately Integrated (03/16/2023)   Social Connection and Isolation Panel [NHANES]    Frequency of Communication with Friends and Family: More than three times a week    Frequency of Social Gatherings with Friends and Family: More than three times a week    Attends Religious Services: More than 4 times per year    Active Member of [employer] or Organizations: Yes    Attends Banker Meetings: 1 to 4 times per year    Marital Status: Never married    Tobacco Counseling Counseling given: Not Answered    Clinical Intake:  Pre-visit preparation completed: Yes  Pain : No/denies pain      BMI - recorded: 34.72 Nutritional Status: BMI > 30  Obese Nutritional Risks: None Diabetes: Yes CBG done?: No Did pt. bring in CBG monitor from home?: No  How often do you need to have someone help you when you read instructions, pamphlets, or other written materials from your doctor or pharmacy?: 1 - Never  Interpreter Needed?: No  Information entered by :: Andrena Margerum, RMA   Activities of Daily Living     03/16/2023    2:22 PM  In your present state of health, do you have any difficulty performing the following activities:  Hearing? 1  Comment slight difference  Vision? 0  Difficulty concentrating or making decisions? 0  Walking or climbing stairs? 0  Dressing or bathing? 0  Comment has an aide to help 3 days a week.  Doing errands, shopping? 0  Preparing Food and eating ? N  Using the Toilet? N  In the past six months, have you accidently leaked urine? Y  Do you have problems with loss of bowel control? N  Managing your Medications? N  Managing your Finances? N  Housekeeping or managing your Housekeeping? N    Patient Care Team: Myrlene Broker, MD as PCP - General (Internal Medicine)  Indicate any recent Medical Services you may have received from other than [facility] providers in the past year (date may be approximate).     Assessment:   This is a routine wellness examination for Glendy.  Hearing/Vision screen Hearing Screening - Comments:: Some slight hearing issues. Vision Screening - Comments:: Wears eyeglasses   Goals Addressed             This Visit's Progress    My goal for 2024 is to maintain my health.   On track      Depression Screen     03/16/2023    2:42 PM 08/26/2022    2:03 PM 07/15/2022    3:08 PM 04/24/2022    9:25 AM 03/13/2022    2:23 PM 10/22/2021  9:32 AM 07/19/2021    3:20 PM  PHQ 2/9 Scores  PHQ - 2 Score 0 6 0 0 3 3 0  PHQ- 9 Score 0 12  0 11 11 0    Fall Risk     03/16/2023    2:34 PM 03/10/2023   10:45 AM  12/09/2022    1:52 PM 09/09/2022    9:27 AM 07/15/2022    3:08 PM  Fall Risk   Falls in the past year? 1 1 0 0 0  Number falls in past yr: 0 0 0 0 0  Injury with Fall? 0 1 0 0 0  Risk for fall due to :     No Fall Risks  Follow up Falls evaluation completed;Falls prevention discussed Falls evaluation completed Falls evaluation completed Falls evaluation completed Falls evaluation completed    MEDICARE RISK AT HOME:  Medicare Risk at Home Any stairs in or around the home?: No Home free of loose throw rugs in walkways, pet beds, electrical cords, etc?: Yes Adequate lighting in your home to reduce risk of falls?: Yes Life alert?: No Use of a cane, walker or w/c?: Yes Grab bars in the bathroom?: Yes Shower chair or bench in shower?: Yes Elevated toilet seat or a handicapped toilet?: Yes  TIMED UP AND GO:  Was the test performed?  No  Cognitive Function: 6CIT completed        03/16/2023    2:35 PM 03/13/2022    2:13 PM  6CIT Screen  What Year? 0 points 0 points  What month? 0 points 0 points  What time? 0 points 0 points  Count back from 20 0 points 0 points  Months in reverse 0 points 0 points  Repeat phrase 0 points 0 points  Total Score 0 points 0 points    Immunizations Immunization History  Administered Date(s) Administered   DTaP 02/10/2015   Fluad Quad(high Dose 65+) 09/09/2018, 10/18/2020, 09/24/2021   Fluad Trivalent(High Dose 65+) 09/09/2022   Influenza, High Dose Seasonal PF 03/11/2018   Moderna SARS-COV2 Booster Vaccination 04/27/2020   Moderna Sars-Covid-2 Vaccination 02/18/2019, 03/18/2019, 11/25/2019   PFIZER Comirnaty(Gray Top)Covid-19 Tri-Sucrose Vaccine 09/24/2021   Pneumococcal Conjugate-13 10/12/2014   Pneumococcal Polysaccharide-23 11/04/2016   RSV,unspecified 09/24/2021   Unspecified SARS-COV-2 Vaccination 09/13/2022   Zoster Recombinant(Shingrix) 08/21/2016, 11/03/2016    Screening Tests Health Maintenance  Topic Date Due   Diabetic kidney  evaluation - Urine ACR  04/24/2023   HEMOGLOBIN A1C  09/10/2023   FOOT EXAM  10/13/2023   Diabetic kidney evaluation - eGFR measurement  12/09/2023   OPHTHALMOLOGY EXAM  12/17/2023   MAMMOGRAM  12/22/2023   Medicare Annual Wellness (AWV)  03/15/2024   Fecal DNA (Cologuard)  11/04/2024   DTaP/Tdap/Td (2 - Tdap) 02/09/2025   Pneumonia Vaccine 25+ Years old  Completed   INFLUENZA VACCINE  Completed   DEXA SCAN  Completed   COVID-19 Vaccine  Completed   Hepatitis C Screening  Completed   Zoster Vaccines- Shingrix  Completed   HPV VACCINES  Aged Out    Health Maintenance  There are no preventive care reminders to display for this patient.  Health Maintenance Items Addressed: See Nurse Notes  Additional Screening:  Vision Screening: Recommended annual ophthalmology exams for early detection of glaucoma and other disorders of the eye.  Dental Screening: Recommended annual dental exams for proper oral hygiene  Community Resource Referral / Chronic Care Management: CRR required this visit?  No   CCM required this visit?  No     Plan:     I have personally reviewed and noted the following in the patient's chart:   Medical and social history Use of alcohol, tobacco or illicit drugs  Current medications and supplements including opioid prescriptions. Patient is not currently taking opioid prescriptions. Functional ability and status Nutritional status Physical activity Advanced directives List of other physicians Hospitalizations, surgeries, and ER visits in previous 12 months Vitals Screenings to include cognitive, depression, and falls Referrals and appointments  In addition, I have reviewed and discussed with patient certain preventive protocols, quality metrics, and best practice recommendations. A written personalized care plan for preventive services as well as general preventive health recommendations were provided to patient.     Terry Bolotin L Andrina Locken,  CMA   03/16/2023   After Visit Summary: (Mail) Due to this being a telephonic visit, the after visit summary with patients personalized plan was offered to patient via mail   Notes: Please refer to Routing Comments.

## 2023-03-16 NOTE — Patient Instructions (Signed)
 [name] , Thank you for taking time to come for your Medicare Wellness Visit. I appreciate your ongoing commitment to your health goals. Please review the following plan we discussed and let me know if I can assist you in the future.   Referrals/Orders/Follow-Ups/Clinician Recommendations: It was very nice talking with you today.  Keep up the good work.  This is a list of the screening recommended for you and due dates:  Health Maintenance  Topic Date Due   Yearly kidney health urinalysis for diabetes  04/24/2023   Hemoglobin A1C  09/10/2023   Complete foot exam   10/13/2023   Yearly kidney function blood test for diabetes  12/09/2023   Eye exam for diabetics  12/17/2023   Mammogram  12/22/2023   Medicare Annual Wellness Visit  03/15/2024   Cologuard (Stool DNA test)  11/04/2024   DTaP/Tdap/Td vaccine (2 - Tdap) 02/09/2025   Pneumonia Vaccine  Completed   Flu Shot  Completed   DEXA scan (bone density measurement)  Completed   COVID-19 Vaccine  Completed   Hepatitis C Screening  Completed   Zoster (Shingles) Vaccine  Completed   HPV Vaccine  Aged Out    Advanced directives: (Declined) Advance directive discussed with you today. Even though you declined this today, please call our office should you change your mind, and we can give you the proper paperwork for you to fill out.  Next Medicare Annual Wellness Visit scheduled for next year: Yes

## 2023-03-19 DIAGNOSIS — J441 Chronic obstructive pulmonary disease with (acute) exacerbation: Secondary | ICD-10-CM | POA: Diagnosis not present

## 2023-03-24 ENCOUNTER — Ambulatory Visit (INDEPENDENT_AMBULATORY_CARE_PROVIDER_SITE_OTHER): Payer: 59 | Admitting: Internal Medicine

## 2023-03-24 VITALS — BP 138/72 | HR 81 | Ht 63.0 in | Wt 193.0 lb

## 2023-03-24 DIAGNOSIS — E039 Hypothyroidism, unspecified: Secondary | ICD-10-CM

## 2023-03-24 NOTE — Progress Notes (Unsigned)
 Name: Lindsay [name]  MRN/ DOB: [id number], 05/18/1945    Age/ Sex: 78 y.o., female     PCP: Myrlene Broker, MD   Reason for Endocrinology Evaluation: Hypothyroidism     Initial Endocrinology Clinic Visit: 01/11/2019    PATIENT IDENTIFIER: Lindsay [name] is a 78 y.o., female with a past medical history of HTN, T2DM and Hypothyroidism. She has followed with [facility] since 01/11/2019 for consultative assistance with management of her hypothyroidism.     HISTORICAL SUMMARY: The patient was diagnosed with hypothyroidism in 12/2018 with a TSH 25.71 uIU/mL . Was started on levothyroxine 50 mcg but developed diarrhea that she attributed to LT-4 replacement and stopped it.  She was encouraged to restart it on her initial visit to our clinic   No Fh of thyroid disease   SUBJECTIVE:   Today (03/24/2023):  Lindsay [name] is here for a follow up on hypothyroidism.   She is currently having URI symptoms for the past week in the form of cough and nasal congestion, no known fever, scheduled to see her PCP tomorrow  She has been noted with weight loss, patient on Rybelsus NO local neck swelling  Denies palpitations  No constipation or diarrhea   Levothyroxine 100 mcg daily    HISTORY:  Past Medical History:  Past Medical History:  Diagnosis Date   Acid reflux    Anemia    Arthritis    Asthma    COPD (chronic obstructive pulmonary disease) (HCC)    Diabetes mellitus without complication (HCC)    TYPE 2    Hyperglycemia    Hypertension    Hypokalemia    Hypothyroidism    Obesity    PONV (postoperative nausea and vomiting)    Spinal stenosis    Urinary tract infection, chronic    Past Surgical History:  Past Surgical History:  Procedure Laterality Date   ABDOMINAL HYSTERECTOMY     ANKLE FUSION Left    CHOLECYSTECTOMY     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 Her smoking use included cigarettes. She started smoking about 46 years ago. She has a 10 pack-year smoking history. She has never used smokeless tobacco. She reports that she does not drink alcohol and does not use drugs. Family History:  Family History  Problem Relation Age of Onset   Asthma Daughter    Microcephaly Maternal Grandmother    Heart disease Maternal Grandmother        Not clear details, patient just knows she took "heart pills"   BRCA 1/2 Neg Hx    Breast cancer Neg Hx      HOME MEDICATIONS: Allergies as of 03/24/2023       Reactions   Codeine Nausea Only   Lisinopril Cough   Morphine And Codeine Itching   Shellfish Allergy Itching        Medication List        Accurate as of March 24, 2023  1:01 PM. If you have any questions, ask your nurse or doctor.          STOP taking these medications    nitrofurantoin (macrocrystal-monohydrate) 100 MG capsule Commonly known as: Macrobid   promethazine-dextromethorphan 6.25-15 MG/5ML syrup Commonly known as: PROMETHAZINE-DM       TAKE these medications  albuterol 108 (90 Base) MCG/ACT inhaler Commonly known as: VENTOLIN HFA INHALE 2 PUFFS INTO THE LUNGS EVERY 6 HOURS AS NEEDED FOR WHEEZING OR SHORTNESS OF BREATH   albuterol (2.5 MG/3ML) 0.083% nebulizer solution Commonly known as: PROVENTIL Take 3 mLs (2.5 mg total) by nebulization every 6 (six) hours as needed for wheezing or shortness of breath.   benzonatate 200 MG capsule Commonly known as: TESSALON Take 1 capsule (200 mg total) by mouth 3 (three) times daily as needed.   blood glucose meter kit and supplies Kit Check blood sugar with glucometer every morning before your first meal   budesonide-formoterol 80-4.5 MCG/ACT inhaler Commonly known as: SYMBICORT Inhale 2 puffs into the lungs 2 (two) times daily.   cetirizine 10 MG  tablet Commonly known as: ZYRTEC Take 1 tablet (10 mg total) by mouth daily. FOR ASTHMA What changed: additional instructions   cyclobenzaprine 10 MG tablet Commonly known as: FLEXERIL Take by mouth.   gabapentin 300 MG capsule Commonly known as: NEURONTIN Take 1 capsule (300 mg total) by mouth 3 (three) times daily.   hydrOXYzine 25 MG capsule Commonly known as: VISTARIL Take 1 capsule (25 mg total) by mouth 2 (two) times daily as needed.   levothyroxine 100 MCG tablet Commonly known as: SYNTHROID Take 1 tablet (100 mcg total) by mouth daily.   losartan 50 MG tablet Commonly known as: COZAAR Take 1 tablet (50 mg total) by mouth daily. TAKE 1 TABLET(50 MG) BY MOUTH DAILY   meloxicam 15 MG tablet Commonly known as: MOBIC Take 1 tablet (15 mg total) by mouth daily.   montelukast 10 MG tablet Commonly known as: SINGULAIR TAKE 1 TABLET(10 MG) BY MOUTH AT BEDTIME   NON FORMULARY DIET- REGULAR CCD   oxybutynin 5 MG tablet Commonly known as: DITROPAN Take 1 tablet (5 mg total) by mouth 2 (two) times daily.   pantoprazole 40 MG tablet Commonly known as: PROTONIX Take 1 tablet (40 mg total) by mouth daily.   Rybelsus 7 MG Tabs Generic drug: Semaglutide Take 1 tablet (7 mg total) by mouth daily.   SALONPAS PAIN RELIEF PATCH EX Place 1 patch onto the skin daily as needed (pain).   simvastatin 40 MG tablet Commonly known as: ZOCOR Take 1 tablet (40 mg total) by mouth daily at 6 PM. TAKE 1 TABLET(40 MG) BY MOUTH DAILY AT 6 PM   tiZANidine 4 MG tablet Commonly known as: ZANAFLEX Take 0.5-1 tablets (2-4 mg total) by mouth every 8 (eight) hours as needed for muscle spasms.   Vitamin D3 50 MCG (2000 UT) capsule Take 1 capsule (2,000 Units total) by mouth daily. Must keep scheduled follow=-up appt for future refills          OBJECTIVE:   PHYSICAL EXAM: VS: BP 138/72   Pulse 81   Ht 5\' 3"  (1.6 m)   Wt 193 lb (87.5 kg)   SpO2 99%   BMI 34.19 kg/m     EXAM: General: Pt appears well and is in NAD  Neck: General: Supple without adenopathy. Thyroid: Thyroid size normal.  No goiter or nodules appreciated.   Lungs: Clear with good BS bilat   Heart: Auscultation: RRR.  Extremities:  BL LE: No pretibial edema normal   Mental Status: Judgment, insight: Intact Mood and affect: No depression, anxiety, or agitation     DATA REVIEWED:  ***  ASSESSMENT / PLAN / RECOMMENDATIONS:   Hypothyroidism:   - No local neck symptoms -Patient is clinically euthyroid -  Medications  levothyroxine 100 mcg daily   F/u in 6 months   Signed electronically by: Lyndle Herrlich, MD  [facility] End[facility]  [facility] [address] [place] [place], [place] [address] Phone: [phone] FAX: [phone]      CC: Myrlene Broker, MD [address] [place] [address] Phone: [phone]  Fax: [phone]   Return to Endocrinology clinic as below: Future Appointments  Date Time Provider Department Center  03/25/2023 10:20 AM Moshe Cipro, FNP [facility] None  06/11/2023 10:40 AM Myrlene Broker, MD [facility] None  03/16/2024  1:40 PM [facility]ANNUAL WELLNESS VISIT 2 [facility] None

## 2023-03-25 ENCOUNTER — Other Ambulatory Visit: Payer: Self-pay

## 2023-03-25 ENCOUNTER — Telehealth: Payer: Self-pay | Admitting: Internal Medicine

## 2023-03-25 ENCOUNTER — Ambulatory Visit (INDEPENDENT_AMBULATORY_CARE_PROVIDER_SITE_OTHER): Admitting: Family Medicine

## 2023-03-25 ENCOUNTER — Encounter: Payer: Self-pay | Admitting: Family Medicine

## 2023-03-25 VITALS — BP 130/88 | HR 80 | Temp 98.5°F | Ht 63.0 in | Wt 190.2 lb

## 2023-03-25 DIAGNOSIS — R062 Wheezing: Secondary | ICD-10-CM

## 2023-03-25 DIAGNOSIS — R0981 Nasal congestion: Secondary | ICD-10-CM

## 2023-03-25 DIAGNOSIS — U071 COVID-19: Secondary | ICD-10-CM

## 2023-03-25 LAB — POCT INFLUENZA A/B
Influenza A, POC: NEGATIVE
Influenza B, POC: NEGATIVE

## 2023-03-25 LAB — POC COVID19 BINAXNOW: SARS Coronavirus 2 Ag: POSITIVE — AB

## 2023-03-25 LAB — T4: T4, Total: 10.7 ug/dL (ref 5.1–11.9)

## 2023-03-25 LAB — TSH: TSH: 0.37 m[IU]/L — ABNORMAL LOW (ref 0.40–4.50)

## 2023-03-25 MED ORDER — METHYLPREDNISOLONE ACETATE 40 MG/ML IJ SUSP
40.0000 mg | Freq: Once | INTRAMUSCULAR | Status: AC
  Administered 2023-03-25: 40 mg via INTRAMUSCULAR

## 2023-03-25 NOTE — Patient Instructions (Signed)
 You have received a steroid injection in the office today.  Continue tessalon perles  Continue inhaler/nebulizers as needed  Follow-up with me for new or worsening symptoms.

## 2023-03-25 NOTE — Telephone Encounter (Signed)
Attempted to contact patient no answer and no vm set up  

## 2023-03-25 NOTE — Progress Notes (Signed)
   Acute Office Visit  Subjective:     Patient ID: Lindsay [name], female    DOB: [birth year], 78 y.o.   MRN: [id number]  Chief Complaint  Patient presents with   Acute Visit    Nasal and chest congestion, loss of smell, productive cough yellow tint, SOB,ongoing for about a week. Has tried     HPI Patient is in today for cough, nasal congestion, fatigue for the last 7 days.  Reports chills 2 and 3 days ago.  Have since resolved. Has never had COVID in the past. Has completed COVID vaccines. Has been taking zyrtec. Denies known sick. Denies abdominal pain, nausea, vomiting, diarrhea, rash, other symptoms.   ROS Per HPI      Objective:    BP 130/88 (BP Location: Left Arm, Patient Position: Sitting)   Pulse 80   Temp 98.5 F (36.9 C) (Temporal)   Ht 5\' 3"  (1.6 m)   Wt 190 lb 3.2 oz (86.3 kg)   SpO2 99%   BMI 33.69 kg/m    Physical Exam Vitals and nursing note reviewed.  Constitutional:      General: She is not in acute distress.    Comments: Appears fatigued  HENT:     Head: Normocephalic and atraumatic.     Nose: Congestion present.     Mouth/Throat:     Mouth: Mucous membranes are moist.     Pharynx: Oropharynx is clear. No oropharyngeal exudate or posterior oropharyngeal erythema.  Eyes:     Extraocular Movements: Extraocular movements intact.  Cardiovascular:     Rate and Rhythm: Normal rate and regular rhythm.     Heart sounds: Normal heart sounds.  Pulmonary:     Effort: Pulmonary effort is normal. No respiratory distress.     Breath sounds: Wheezing present. No rhonchi or rales.  Musculoskeletal:     Cervical back: Normal range of motion and neck supple.     Comments: Using single point cane  Lymphadenopathy:     Cervical: Cervical adenopathy present.  Skin:    General: Skin is warm and dry.  Neurological:     Mental Status: She is alert.     Results for orders placed or performed in visit on 03/25/23  POC COVID-19 BinaxNow  Result Value  Ref Range   SARS Coronavirus 2 Ag Positive (A) Negative  POCT Influenza A/B  Result Value Ref Range   Influenza A, POC Negative Negative   Influenza B, POC Negative Negative        Assessment & Plan:   COVID-19  Nasal congestion -     POC COVID-19 BinaxNow -     POCT Influenza A/B  Wheezing -     methylPREDNISolone Acetate  Continue supportive care at home.  On day 7 of symptoms, outside of range for antiviral treatment.  Continue current medication regimen.  Follow-up with me for new or worsening symptoms.   Meds ordered this encounter  Medications   methylPREDNISolone acetate (DEPO-MEDROL) injection 40 mg    Return if symptoms worsen or fail to improve.  Moshe Cipro, FNP

## 2023-03-25 NOTE — Telephone Encounter (Signed)
 Please let the patient know that the current dose of levothyroxine is a bit too much for her, my suggestion is to take half a tablet on Sundays from now on, but continue to take 1 tablet Monday through Saturday    Please schedule the patient for repeat lab in 2 months    Thanks

## 2023-03-26 NOTE — Telephone Encounter (Signed)
Attempted to contact patient no answer and no vm    

## 2023-03-27 ENCOUNTER — Ambulatory Visit: Payer: Self-pay

## 2023-03-27 NOTE — Telephone Encounter (Signed)
  Chief Complaint: COVID + - cough, loss of smell Symptoms: above Frequency: before 03/25/2023 Pertinent Negatives: Patient denies Fever, worsening s/s Disposition: [] ED /[] Urgent Care (no appt availability in office) / [] Appointment(In office/virtual)/ []  [place] [facility]/ [x] Home Care/ [] Refused Recommended Disposition /[] [place] [facility]/ []  Follow-up with PCP Additional Notes: Pt was seen and diagnosed with COVID 03/25/2023. Since that time pt's s/s are improving. Pt still has a cough, and sense of smell has not fully returned. Pt had questions about isolation & recovery.  Discussed home care suggestions. Pt may go to pharmacy to discuss OTC medications with PharmD.  Pt is concerned about loss of smell. Discussed danger of not being ale to detect; smoke, spoiled food, gas leak etc.  Pt will call back if needed.  Summary: Covid Symptoms   Copied From CRM [id number]. Reason for Triage: Patient seen Moshe Cipro 03/25/23, wants to know how long does covid last? She's still having head congestion and can hardly smell     Reason for Disposition  [1] COVID-19 diagnosed by doctor (or NP/PA) AND [2] mild symptoms (e.g., cough, fever, others) AND [3] no complications or SOB  Answer Assessment - Initial Assessment Questions 1. COVID-19 DIAGNOSIS: "How do you know that you have COVID?" (e.g., positive lab test or self-test, diagnosed by doctor or NP/PA, symptoms after exposure).     OV with testing 2. COVID-19 EXPOSURE: "Was there any known exposure to COVID before the symptoms began?" CDC Definition of close contact: within 6 feet (2 meters) for a total of 15 minutes or more over a 24-hour period.       3. ONSET: "When did the COVID-19 symptoms start?"      Before 03/25/2023 4. WORST SYMPTOM: "What is your worst symptom?" (e.g., cough, fever, shortness of breath, muscle aches)     Cough, loss of smell 5. COUGH: "Do you have a cough?" If Yes, ask: "How bad is the cough?"        yes 6. FEVER: "Do you have a fever?" If Yes, ask: "What is your temperature, how was it measured, and when did it start?"     no 7. RESPIRATORY STATUS: "Describe your breathing?" (e.g., normal; shortness of breath, wheezing, unable to speak)      no 8. BETTER-SAME-WORSE: "Are you getting better, staying the same or getting worse compared to yesterday?"  If getting worse, ask, "In what way?"    Better 9. OTHER SYMPTOMS: "Do you have any other symptoms?"  (e.g., chills, fatigue, headache, loss of smell or taste, muscle pain, sore throat)     Loss of smell 10. HIGH RISK DISEASE: "Do you have any chronic medical problems?" (e.g., asthma, heart or lung disease, weak immune system, obesity, etc.)       asthma  Protocols used: Coronavirus (COVID-19) Diagnosed or Suspected-A-AH

## 2023-03-27 NOTE — Telephone Encounter (Signed)
**Note De-identified  [place] Obfuscation** Please advise 

## 2023-03-27 NOTE — Telephone Encounter (Signed)
 Called patient and relayed back the message to her in regards to what the nurse has to say also letting her know to drink plenty of fluids

## 2023-04-02 ENCOUNTER — Telehealth: Payer: Self-pay | Admitting: Internal Medicine

## 2023-04-02 NOTE — Telephone Encounter (Signed)
 Patient states that she is having coughing spasm and would like a antibiotic and she strongly states that she doesn't want to come in and she states that it is making her extremely weak . Please advise

## 2023-04-02 NOTE — Telephone Encounter (Signed)
 Copied from CRM [id number]. Topic: Clinical - Medication Question >> Apr 02, 2023  8:13 AM Lindsay [name] wrote: Reason for CRM: Patient would like Dr. Okey Dupre nurse to give her a call regarding medication. Please call [phone]

## 2023-04-03 NOTE — Telephone Encounter (Signed)
 Attempted to reach patient, unable to LVM no dial tone.

## 2023-04-14 DIAGNOSIS — N3020 Other chronic cystitis without hematuria: Secondary | ICD-10-CM | POA: Diagnosis not present

## 2023-04-19 DIAGNOSIS — J441 Chronic obstructive pulmonary disease with (acute) exacerbation: Secondary | ICD-10-CM | POA: Diagnosis not present

## 2023-05-08 ENCOUNTER — Telehealth: Payer: Self-pay | Admitting: Internal Medicine

## 2023-05-08 ENCOUNTER — Encounter: Payer: Self-pay | Admitting: Nurse Practitioner

## 2023-05-08 NOTE — Telephone Encounter (Signed)
 Copied from CRM [id number]. Topic: Clinical - Medication Question >> May 08, 2023 10:04 AM Kita Perish H wrote: Reason for CRM: Patient is calling stating she's been coughing and states she thinks it's from the losartan  (COZAAR ) 50 MG tablet, states her blood pressure medicine was switched before for the same issue. Patient states she was told that the medication causes a dry cough and wants something different.  Elene [phone]

## 2023-05-11 ENCOUNTER — Encounter: Payer: Self-pay | Admitting: Emergency Medicine

## 2023-05-11 ENCOUNTER — Ambulatory Visit (INDEPENDENT_AMBULATORY_CARE_PROVIDER_SITE_OTHER)

## 2023-05-11 ENCOUNTER — Ambulatory Visit: Payer: Self-pay

## 2023-05-11 ENCOUNTER — Ambulatory Visit: Admission: EM | Admit: 2023-05-11 | Discharge: 2023-05-11 | Disposition: A | Discharge status: Home / Self Care

## 2023-05-11 DIAGNOSIS — Z7984 Long term (current) use of oral hypoglycemic drugs: Secondary | ICD-10-CM | POA: Diagnosis not present

## 2023-05-11 DIAGNOSIS — N3000 Acute cystitis without hematuria: Secondary | ICD-10-CM

## 2023-05-11 DIAGNOSIS — R109 Unspecified abdominal pain: Secondary | ICD-10-CM | POA: Diagnosis not present

## 2023-05-11 DIAGNOSIS — Z87891 Personal history of nicotine dependence: Secondary | ICD-10-CM | POA: Insufficient documentation

## 2023-05-11 DIAGNOSIS — R053 Chronic cough: Secondary | ICD-10-CM

## 2023-05-11 DIAGNOSIS — R059 Cough, unspecified: Secondary | ICD-10-CM | POA: Diagnosis not present

## 2023-05-11 DIAGNOSIS — E118 Type 2 diabetes mellitus with unspecified complications: Secondary | ICD-10-CM | POA: Diagnosis not present

## 2023-05-11 LAB — POCT URINALYSIS DIP (MANUAL ENTRY)
Bilirubin, UA: NEGATIVE
Blood, UA: NEGATIVE
Glucose, UA: NEGATIVE mg/dL
Ketones, POC UA: NEGATIVE mg/dL
Nitrite, UA: NEGATIVE
Protein Ur, POC: NEGATIVE mg/dL
Spec Grav, UA: 1.02 (ref 1.010–1.025)
Urobilinogen, UA: 0.2 U/dL
pH, UA: 6 (ref 5.0–8.0)

## 2023-05-11 MED ORDER — PREDNISONE 20 MG PO TABS: 40.0000 mg | ORAL_TABLET | Freq: Every day | ORAL | 0 refills | Status: AC

## 2023-05-11 MED ORDER — AMOXICILLIN-POT CLAVULANATE 875-125 MG PO TABS: 1.0000 | ORAL_TABLET | Freq: Two times a day (BID) | ORAL | 0 refills | Status: DC

## 2023-05-11 NOTE — ED Triage Notes (Signed)
 Pt reports productive cough w/ clear sputum x3 weeks. Believes her other symptoms are associated with initial cough. Headache started around the same time 3 weeks ago. Pt states "loss of balance" is her not being able to walk in a straight line. "I feel like I am zig zagging when I walk." Pt states that suprapubic abdominal pain started 1 week ago and thinks her bladder is strained from all the coughing. Pt reports similar symptoms in the past when she was having side effects from her lisinopril dose and she wonders if her new BP med is causing these symptoms. States new med is losartan  and she's been on it 3 years with none of these symptoms. No relief with tessalon  pearls.

## 2023-05-11 NOTE — Telephone Encounter (Signed)
  Chief Complaint: dry cough, clear sputum Symptoms: cough, runny nose, sore throat,  Frequency: ongoing for "awhile" Pertinent Negatives: Patient denies fever,  Disposition: [] ED /[] Urgent Care (no appt availability in office) / [x] Appointment(In office/virtual)/ []  [place] [facility]/ [] Home Care/ [x] Refused Recommended Disposition /[] [place] [facility]/ []  Follow-up with PCP Additional Notes: Pt states that she feels that losarten is causing her to have a reactions. Pt states that she has been on losarten for about 3 months. Pt states that she has been having leg cramps, coughing, runny nose. Pt is on statin but states that she has been on it for "awhile". Pt states that she is not SOB, but the coughing has been impacting her asthma so she has been doing treatments. Pt states "what is with all these questions". RN explained nurse triage, asked pt if okay to continue triage. Pt accepted. Next appt in clinic was at 1440 today, pt refused the appt. Pt will be going to the UC.   Copied from CRM [id number]. Topic: Clinical - Red Word Triage >> May 11, 2023  9:53 AM Chuck Crater wrote: Red Word that prompted transfer to Nurse Triage: Patient is taking Losartan  and is having coughing non stop, leg cramps, back pain, chest sore, shortness of breath, runny eyes, and off balanced. Reason for Disposition . [1] MILD difficulty breathing (e.g., minimal/no SOB at rest, SOB with walking, pulse <100) AND [2] still present when not coughing  Answer Assessment - Initial Assessment Questions 1. ONSET: "When did the cough begin?"      Ongoing for awhile 2. SEVERITY: "How bad is the cough today?"      Severe today, constant 3. SPUTUM: "Describe the color of your sputum" (none, dry cough; clear, white, yellow, green)     clear 4. HEMOPTYSIS: "Are you coughing up any blood?" If so ask: "How much?" (flecks, streaks, tablespoons, etc.)     denies 5. DIFFICULTY BREATHING: "Are you having difficulty breathing?"  If Yes, ask: "How bad is it?" (e.g., mild, moderate, severe)    - MILD: No SOB at rest, mild SOB with walking, speaks normally in sentences, can lie down, no retractions, pulse < 100.    - MODERATE: SOB at rest, SOB with minimal exertion and prefers to sit, cannot lie down flat, speaks in phrases, mild retractions, audible wheezing, pulse 100-120.    - SEVERE: Very SOB at rest, speaks in single words, struggling to breathe, sitting hunched forward, retractions, pulse > 120      Pt states that she is asthmatic, she believes that she is SOB because she is coughing 6. FEVER: "Do you have a fever?" If Yes, ask: "What is your temperature, how was it measured, and when did it start?"     denies 8. LUNG HISTORY: "Do you have any history of lung disease?"  (e.g., pulmonary embolus, asthma, emphysema)     asthma 9. PE RISK FACTORS: "Do you have a history of blood clots?" (or: recent major surgery, recent prolonged travel, bedridden)     denies 10. OTHER SYMPTOMS: "Do you have any other symptoms?" (e.g., runny nose, wheezing, chest pain)       Runny nose and sore throat,  Protocols used: Cough - Acute Non-Productive-A-AH

## 2023-05-11 NOTE — ED Provider Notes (Signed)
 [facility]    CSN: [id number] Arrival date & time: 05/11/23  1417      History   Chief Complaint Chief Complaint  Patient presents with   Cough   Abdominal Pain   Loss of Balance   Headache    HPI Lindsay [name] is a 78 y.o. female.   Presents today for evaluation of cough she has had for 3 weeks.  She reports cough is productive of clear sputum but has been more productive than usual.  Discussed COPD with patient she reports that she was at one time told that she did not have COPD after testing however it is listed in her chart.  She reports she is having worsening cough at nighttime and is having trouble sleeping due to same.  She also reports that she has had some suprapubic abdominal pain and is not sure if this is due to pulling something due to cough.  She has not had fever.  She has tried Tessalon  Perles without significant improvement.  The history is provided by the patient.  Cough Associated symptoms: headaches   Associated symptoms: no chills, no ear pain, no eye discharge, no fever, no shortness of breath, no sore throat and no wheezing   Abdominal Pain Associated symptoms: cough   Associated symptoms: no chills, no diarrhea, no dysuria, no fever, no nausea, no shortness of breath, no sore throat and no vomiting   Headache Associated symptoms: abdominal pain, congestion and cough   Associated symptoms: no diarrhea, no ear pain, no fever, no nausea, no sore throat and no vomiting     Past Medical History:  Diagnosis Date   Acid reflux    Anemia    Arthritis    Asthma    COPD (chronic obstructive pulmonary disease) (HCC)    Diabetes mellitus without complication (HCC)    TYPE 2    Hyperglycemia    Hypertension    Hypokalemia    Hypothyroidism    Obesity    PONV (postoperative nausea and vomiting)    Spinal stenosis    Urinary tract infection, chronic     Patient Active Problem List   Diagnosis Date Noted   Low back pain 03/10/2023    Persistent cough 07/15/2022   Other fatigue 07/19/2021   Eczema 10/24/2020   Status post total knee replacement, right 09/30/2019   Routine general medical examination at a health care facility 09/05/2019   Anemia, unspecified 07/28/2019   Hypothyroidism 01/12/2019   Ganglion of flexor tendon sheath of right middle finger 07/15/2018   Type 2 diabetes mellitus with complication (HCC) 02/11/2018   Essential hypertension 02/11/2018   Hyperlipidemia associated with type 2 diabetes mellitus (HCC) 02/11/2018   COPD (chronic obstructive pulmonary disease) (HCC)    Acid reflux    Urinary tract infection, chronic     Past Surgical History:  Procedure Laterality Date   ABDOMINAL HYSTERECTOMY     ANKLE FUSION Left    CHOLECYSTECTOMY     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   Procedure: TOTAL KNEE ARTHROPLASTY;  Surgeon: Neil Balls, MD;  Location: [facility];  Service: Orthopedics;  Laterality: Right;    OB History   No obstetric history on file.      Home Medications    Prior to Admission medications   Medication Sig Start Date End Date Taking? Authorizing Provider  albuterol  (PROVENTIL ) (2.5 MG/3ML) 0.083% nebulizer solution Take 3 mLs (2.5 mg total) by nebulization every 6 (six) hours as needed for wheezing  or shortness of breath. 09/09/22  Yes Adelia Homestead, MD  albuterol  (VENTOLIN  HFA) 108 [phone] Base) MCG/ACT inhaler INHALE 2 PUFFS INTO THE LUNGS EVERY 6 HOURS AS NEEDED FOR WHEEZING OR SHORTNESS OF BREATH 08/20/22  Yes Adelia Homestead, MD  amoxicillin -clavulanate (AUGMENTIN ) 875-125 MG tablet Take 1 tablet by mouth every 12 (twelve) hours. 05/11/23  Yes Vernestine Gondola, PA-C  benzonatate  (TESSALON ) 200 MG capsule Take 200 mg by mouth 3 (three) times daily as needed for cough.   Yes [provider]  BREZTRI  AEROSPHERE 160-9-4.8 MCG/ACT AERO inhaler Inhale 2 puffs into the lungs 2 (two) times daily. 04/27/23  Yes [provider]  budesonide -formoterol   (SYMBICORT ) 80-4.5 MCG/ACT inhaler Inhale 2 puffs into the lungs 2 (two) times daily. 10/13/22  Yes Adelia Homestead, MD  cetirizine  (ZYRTEC ) 10 MG tablet Take 1 tablet (10 mg total) by mouth daily. FOR ASTHMA Patient taking differently: Take 10 mg by mouth daily. 09/05/19  Yes Antonio Baumgarten, NP  Cholecalciferol  (VITAMIN D3) 50 MCG (2000 UT) capsule Take 1 capsule (2,000 Units total) by mouth daily. Must keep scheduled follow=-up appt for future refills 08/06/19  Yes Medina-Vargas, Monina C, NP  cyclobenzaprine  (FLEXERIL ) 10 MG tablet Take by mouth. 07/18/21  Yes [provider]  estradiol (ESTRACE) 0.1 MG/GM vaginal cream Place vaginally. 04/14/23  Yes [provider]  gabapentin  (NEURONTIN ) 300 MG capsule Take 1 capsule (300 mg total) by mouth 3 (three) times daily. 09/09/22  Yes Adelia Homestead, MD  GEMTESA 75 MG TABS Take 1 tablet by mouth daily. 04/14/23  Yes [provider]  hydrOXYzine  (VISTARIL ) 25 MG capsule Take 1 capsule (25 mg total) by mouth 2 (two) times daily as needed. 09/09/22  Yes Adelia Homestead, MD  levothyroxine  (SYNTHROID ) 100 MCG tablet Take 1 tablet (100 mcg total) by mouth daily. 09/19/22  Yes Shamleffer, Ibtehal Jaralla, MD  losartan  (COZAAR ) 50 MG tablet Take 1 tablet (50 mg total) by mouth daily. TAKE 1 TABLET(50 MG) BY MOUTH DAILY 02/13/23  Yes Adelia Homestead, MD  meloxicam  (MOBIC ) 15 MG tablet Take 1 tablet (15 mg total) by mouth daily. 03/10/23  Yes Adelia Homestead, MD  Menthol-Methyl Salicylate (SALONPAS PAIN RELIEF PATCH EX) Place 1 patch onto the skin daily as needed (pain).   Yes [provider]  montelukast  (SINGULAIR ) 10 MG tablet TAKE 1 TABLET(10 MG) BY MOUTH AT BEDTIME 02/13/23  Yes Adelia Homestead, MD  pantoprazole  (PROTONIX ) 40 MG tablet Take 1 tablet (40 mg total) by mouth daily. 10/13/22  Yes Adelia Homestead, MD  predniSONE  (DELTASONE ) 20 MG tablet Take 2 tablets (40 mg total) by mouth daily with  breakfast for 5 days. 05/11/23 05/16/23 Yes Vernestine Gondola, PA-C  RESTASIS 0.05 % ophthalmic emulsion 1 drop 2 (two) times daily. 04/28/23  Yes [provider]  Semaglutide  (RYBELSUS ) 7 MG TABS Take 1 tablet (7 mg total) by mouth daily. 03/10/23  Yes Adelia Homestead, MD  simvastatin  (ZOCOR ) 40 MG tablet Take 1 tablet (40 mg total) by mouth daily at 6 PM. TAKE 1 TABLET(40 MG) BY MOUTH DAILY AT 6 PM 02/13/23  Yes Adelia Homestead, MD  blood glucose meter kit and supplies KIT Check blood sugar with glucometer every morning before your first meal Patient not taking: Reported on 05/11/2023 12/27/21   Zorita Hiss, NP  NON FORMULARY DIET- REGULAR CCD Patient not taking: Reported on 05/11/2023 07/27/19   [provider]  oxybutynin  (DITROPAN ) 5  MG tablet Take 1 tablet (5 mg total) by mouth 2 (two) times daily. Patient not taking: Reported on 05/11/2023 08/06/19   Medina-Vargas, Monina C, NP  tiZANidine  (ZANAFLEX ) 4 MG tablet Take 0.5-1 tablets (2-4 mg total) by mouth every 8 (eight) hours as needed for muscle spasms. 02/13/23   Adelia Homestead, MD    Family History Family History  Problem Relation Age of Onset   Asthma Daughter    Microcephaly Maternal Grandmother    Heart disease Maternal Grandmother        Not clear details, patient just knows she took "heart pills"   BRCA 1/2 Neg Hx    Breast cancer Neg Hx     Social History Social History   Tobacco Use   Smoking status: Former    Current packs/day: 0.00    Average packs/day: 0.5 packs/day for 20.0 years (10.0 ttl pk-yrs)    Types: Cigarettes    Start date: 01/06/1977    Quit date: 01/06/1997    Years since quitting: 26.3   Smokeless tobacco: Never  Vaping Use   Vaping status: Never Used  Substance Use Topics   Alcohol  use: No   Drug use: No     Allergies   Codeine, Lisinopril, Losartan , Morphine and codeine, and Shellfish allergy   Review of Systems Review of Systems  Constitutional:  Negative for chills  and fever.  HENT:  Positive for congestion. Negative for ear pain and sore throat.   Eyes:  Negative for discharge and redness.  Respiratory:  Positive for cough. Negative for shortness of breath and wheezing.   Gastrointestinal:  Positive for abdominal pain. Negative for diarrhea, nausea and vomiting.  Genitourinary:  Negative for dysuria.  Neurological:  Positive for headaches.     Physical Exam Triage Vital Signs ED Triage Vitals [05/11/23 1438]  Encounter Vitals Group     BP 127/79     Systolic BP Percentile      Diastolic BP Percentile      Pulse Rate 89     Resp 18     Temp 98.6 F (37 C)     Temp Source Oral     SpO2 95 %     Weight      Height      Head Circumference      Peak Flow      Pain Score 4     Pain Loc      Pain Education      Exclude from Growth Chart    No data found.  Updated Vital Signs BP 127/79 (BP Location: Left Arm)   Pulse 89   Temp 98.6 F (37 C) (Oral)   Resp 18   SpO2 95%   Visual Acuity Right Eye Distance:   Left Eye Distance:   Bilateral Distance:    Right Eye Near:   Left Eye Near:    Bilateral Near:     Physical Exam Vitals and nursing note reviewed.  Constitutional:      General: She is not in acute distress.    Appearance: Normal appearance. She is not ill-appearing.  HENT:     Head: Normocephalic and atraumatic.     Nose: No congestion or rhinorrhea.     Mouth/Throat:     Mouth: Mucous membranes are moist.     Pharynx: No oropharyngeal exudate or posterior oropharyngeal erythema.  Eyes:     Conjunctiva/sclera: Conjunctivae normal.  Cardiovascular:     Rate and Rhythm: Normal rate and regular  rhythm.     Heart sounds: Normal heart sounds. No murmur heard. Pulmonary:     Effort: Pulmonary effort is normal. No respiratory distress.     Breath sounds: Wheezing (rare, scattered) present. No rhonchi or rales.  Skin:    General: Skin is warm and dry.  Neurological:     Mental Status: She is alert.  Psychiatric:         Mood and Affect: Mood normal.        Thought Content: Thought content normal.      UC Treatments / Results  Labs (all labs ordered are listed, but only abnormal results are displayed) Labs Reviewed  POCT URINALYSIS DIP (MANUAL ENTRY) - Abnormal; Notable for the following components:      Result Value   Clarity, UA cloudy (*)    Leukocytes, UA Small (1+) (*)    All other components within normal limits  URINE CULTURE    EKG   Radiology DG Chest 2 View Result Date: 05/11/2023 CLINICAL DATA:  Cough. EXAM: CHEST - 2 VIEW COMPARISON:  08/26/2022. FINDINGS: The heart size and mediastinal contours are within normal limits. No focal consolidation, pleural effusion, or pneumothorax. Degenerative changes of the thoracic spine. No acute osseous abnormality. IMPRESSION: No acute cardiopulmonary findings. Electronically Signed   By: Mannie Seek M.D.   On: 05/11/2023 15:45    Procedures Procedures (including critical care time)  Medications Ordered in UC Medications - No data to display  Initial Impression / Assessment and Plan / UC Course  I have reviewed the triage vital signs and the nursing notes.  Pertinent labs & imaging results that were available during my care of the patient were reviewed by me and considered in my medical decision making (see chart for details).   Chest x-ray ordered without acute findings.  Suspect likely COPD exacerbation and will treat with steroid burst.  Patient is a diabetic, but A1c very well-controlled at last check.  UA with small LE positive and will treat to cover UTI.  Urine culture ordered.  Augmentin  chose as antimicrobial given upper respiratory symptoms as well.  Suspect reported off-balance could be mild vertigo and hopefully will improve with steroid treatment.  Encouraged follow-up with primary care if symptoms do not improve with treatment or sooner with any worsening.   Final Clinical Impressions(s) / UC Diagnoses   Final diagnoses:   Persistent cough  Acute cystitis without hematuria  Type 2 diabetes mellitus with complication [facility])   Discharge Instructions   None    ED Prescriptions     Medication Sig Dispense Auth. Provider   predniSONE  (DELTASONE ) 20 MG tablet Take 2 tablets (40 mg total) by mouth daily with breakfast for 5 days. 10 tablet Duaine Radin F, PA-C   amoxicillin -clavulanate (AUGMENTIN ) 875-125 MG tablet Take 1 tablet by mouth every 12 (twelve) hours. 14 tablet Vernestine Gondola, PA-C      PDMP not reviewed this encounter.   Vernestine Gondola, PA-C 05/11/23 [phone]

## 2023-05-11 NOTE — Telephone Encounter (Signed)
 Called pt and lvm and was unable to leave VS

## 2023-05-12 ENCOUNTER — Ambulatory Visit: Payer: Self-pay

## 2023-05-12 ENCOUNTER — Other Ambulatory Visit: Payer: Self-pay | Admitting: Internal Medicine

## 2023-05-12 DIAGNOSIS — J22 Unspecified acute lower respiratory infection: Secondary | ICD-10-CM

## 2023-05-12 NOTE — Telephone Encounter (Signed)
 Chief Complaint: coughing Symptoms: chronic cough Frequency: x 3 weeks Pertinent Negatives: Patient denies fever, URI sx, CP, severe SOB Disposition: [] ED /[] Urgent Care (no appt availability in office) / [x] Appointment(In office/virtual)/ []  [facility]/ [] Home Care/ [] Refused Recommended Disposition /[] [place] [facility]/ []  Follow-up with PCP Additional Notes: Pt c/o chronic coughing x 3 weeks. Pt reports going to UC yesterday and was given steroids and abx for COPD exacerbation. Pt denies being dx with COPD as she saw Dr. Waymond Hailey a few years ago. Pt now looking to re-establish care with [facility]. Scheduled patient per protocol on 05/13/2023. Patient verbalized understanding and to call back with worsening symptoms.     Copied from CRM [id number]. Topic: Clinical - Red Word Triage >> May 12, 2023  9:56 AM Ambrose Junk wrote: [facility] that prompted transfer to Nurse Triage: Excessive coughing, patient states she's been told she has COPD, UC on yesterday, saw Dr Waymond Hailey in 2019. Reason for Disposition  Cough has been present for > 3 weeks  Answer Assessment - Initial Assessment Questions E2C2 Pulmonary Triage - Initial Assessment Questions "Chief Complaint (e.g., cough, sob, wheezing, fever, chills, sweat or additional symptoms) *Go to specific symptom protocol after initial questions. Coughing Reports went to UC yesterday - pt reports rx of 20 mg prednisone  taper, and abx  Pt reports did a "COPD test" with Dr. Waymond Hailey a few years ago and was not dx with COPD  "How long have symptoms been present?" X3 weeks  Have you tested for COVID or Flu? Note: If not, ask patient if a home test can be taken. If so, instruct patient to call back for positive results. No  MEDICINES:   "Have you used any OTC meds to help with symptoms?" No If yes, ask "What medications?" N/a  "Have you used your inhalers/maintenance medication?" Yes If yes, "What medications?" Symbicort  - 2 puffs twice a day - pt  reports taking daily recently d.t illness, but was previously using PRN Albuterol  NEB PRN  Pt reports Dr. Waymond Hailey provided action plan   If inhaler, ask "How many puffs and how often?" Note: Review instructions on medication in the chart. See above  OXYGEN : "Do you wear supplemental oxygen ?" No If yes, "How many liters are you supposed to use?" N/a  "Do you monitor your oxygen  levels?" No If yes, "What is your reading (oxygen  level) today?" N/a   1. ONSET: "When did the cough begin?"      X 3 weeks 2. SEVERITY: "How bad is the cough today?"      N/a 3. SPUTUM: "Describe the color of your sputum" (none, dry cough; clear, white, yellow, green)     Clear mucus 4. HEMOPTYSIS: "Are you coughing up any blood?" If so ask: "How much?" (flecks, streaks, tablespoons, etc.)     denies 5. DIFFICULTY BREATHING: "Are you having difficulty breathing?" If Yes, ask: "How bad is it?" (e.g., mild, moderate, severe)    - MILD: No SOB at rest, mild SOB with walking, speaks normally in sentences, can lie down, no retractions, pulse < 100.    - MODERATE: SOB at rest, SOB with minimal exertion and prefers to sit, cannot lie down flat, speaks in phrases, mild retractions, audible wheezing, pulse 100-120.    - SEVERE: Very SOB at rest, speaks in single words, struggling to breathe, sitting hunched forward, retractions, pulse > 120      Denies wheezing, but does endorse mild SOB after coughing fits Pt reports taking neb treatment post-fit  with relief 6. FEVER: "Do you have a fever?" If Yes, ask: "What is your temperature, how was it measured, and when did it start?"     denies 7. CARDIAC HISTORY: "Do you have any history of heart disease?" (e.g., heart attack, congestive heart failure)      denies 8. LUNG HISTORY: "Do you have any history of lung disease?"  (e.g., pulmonary embolus, asthma, emphysema)     COPD per chart 9. PE RISK FACTORS: "Do you have a history of blood clots?" (or: recent major surgery,  recent prolonged travel, bedridden)     denies 10. OTHER SYMPTOMS: "Do you have any other symptoms?" (e.g., runny nose, wheezing, chest pain)       Reports L nostril occasionally bleeds  Protocols used: Cough - Acute Productive-A-AH

## 2023-05-13 ENCOUNTER — Telehealth (HOSPITAL_COMMUNITY): Payer: Self-pay

## 2023-05-13 ENCOUNTER — Encounter: Payer: Self-pay | Admitting: Pulmonary Disease

## 2023-05-13 ENCOUNTER — Ambulatory Visit (INDEPENDENT_AMBULATORY_CARE_PROVIDER_SITE_OTHER): Admitting: Pulmonary Disease

## 2023-05-13 VITALS — BP 142/80 | HR 78 | Temp 98.2°F | Ht 63.0 in | Wt 192.0 lb

## 2023-05-13 DIAGNOSIS — Z87891 Personal history of nicotine dependence: Secondary | ICD-10-CM

## 2023-05-13 DIAGNOSIS — J4551 Severe persistent asthma with (acute) exacerbation: Secondary | ICD-10-CM

## 2023-05-13 LAB — URINE CULTURE: Culture: >=100000 — AB

## 2023-05-13 MED ORDER — PREDNISONE 20 MG PO TABS: 20.0000 mg | ORAL_TABLET | Freq: Every day | ORAL | 0 refills | Status: AC

## 2023-05-13 MED ORDER — CEFDINIR 300 MG PO CAPS: 300.0000 mg | ORAL_CAPSULE | Freq: Two times a day (BID) | ORAL | 0 refills | Status: AC

## 2023-05-13 MED ORDER — BUDESONIDE-FORMOTEROL FUMARATE 160-4.5 MCG/ACT IN AERO: 2.0000 | INHALATION_SPRAY | Freq: Two times a day (BID) | RESPIRATORY_TRACT | 12 refills | Status: AC

## 2023-05-13 NOTE — Patient Instructions (Signed)
 I think the cough is related to underlying asthma  It is common in with asthma to get recurrent or repeated bouts of bronchitis or cough that last for many weeks  It is common for viral infections like COVID to make cough related to asthma much worse  Stop Breztri   New prescription for high-dose Symbicort , do not use your prior Symbicort  inhalers.  The new dose is higher.  2 puffs twice a day every day.  Rinse your mouth out after every use  Take the antibiotic and prednisone  as prescribed from the ED on 5/5.  When you finish the prednisone  from the ED, the next day take prednisone  20 mg once a day for 5 days.  New prescription today.  We may need to thing about injection medicines to treat cough related to asthma if the inhalers do not take care of things  Return to clinic in 2 months or sooner as needed with Dr. Marygrace Snellen

## 2023-05-13 NOTE — Progress Notes (Unsigned)
 @Patient  ID: Lindsay [name], female    DOB: 10/02/1945, 78 y.o.   MRN: [id number]  Chief Complaint  Patient presents with   Consult    Pulmonary consult.  Cough x 3 weeks.    Referring provider: Adelia Homestead, *  HPI:   78 y.o. woman with history of asthma whom we are seeing for cough, acute on chronic.  Multiple PCP notes reviewed.  Multiple prior pulmonary notes most recently 2021 reviewed.  ED note 05/11/2023 reviewed.  Patient seen in pulmonary clinic in the past most recently 2021.  Started in 2019.  For cough.  Felt to be referred coloration of asthma as well as UA CS.  Seen improved on Symbicort .  Review of records indicate multiple rounds of bronchitis or "cough with chest x-rays in the past.  Most recently 05/2023 with clear lungs on chest x-ray, similar 07/2022 with clear lungs bilaterally on my review and interpretation.  It sounds like sometime in the last year or so her inhaler regimen was escalated from low-dose Symbicort  to Breztri .  In fact she is taking both now it seems.  No real change in the cough.  03/2023 she reports being diagnosed with COVID.  Persistent cough.  Gradually got better.  Then about 3 weeks ago cough returned.  Productive.  Brings up phlegm.  Clear.  All times a day.  Sometimes worse at night.  Seen in the ED as above 05/11/2023 with clear chest x-ray.  Prescribed doxycycline  and prednisone .  She is not sure how much it is helping albeit less than 48 hours.  We discussed at length asthma.  Recurrent bronchitis seen in the past.  Likely demonstration of poorly controlled asthma.  Discussed role and rationale for maintenance inhaler.  Discussed role and rationale for possible Biologics in the future.  Discussed that with recent COVID infection likely worsened postviral.  Notably eosinophils 300 07/2022.  Questionaires / Pulmonary Flowsheets:   ACT:  Asthma Control Test ACT Total Score  04/06/2020 11:07 AM 14  03/09/2020  9:54 AM 18    MMRC:     No  data to display          Epworth:      No data to display          Tests:   FENO:  No results found for: "NITRICOXIDE"  PFT:     No data to display          WALK:      No data to display          Imaging: Personally reviewed and as per EMR and discussion in this note DG Chest 2 View Result Date: 05/11/2023 CLINICAL DATA:  Cough. EXAM: CHEST - 2 VIEW COMPARISON:  08/26/2022. FINDINGS: The heart size and mediastinal contours are within normal limits. No focal consolidation, pleural effusion, or pneumothorax. Degenerative changes of the thoracic spine. No acute osseous abnormality. IMPRESSION: No acute cardiopulmonary findings. Electronically Signed   By: Mannie Seek M.D.   On: 05/11/2023 15:45    Lab Results: Personally reviewed CBC    Component Value Date/Time   WBC 8.1 12/09/2022 1441   RBC 3.92 12/09/2022 1441   HGB 10.4 (L) 12/09/2022 1441   HGB 10.6 (L) 08/03/2022 0920   HCT 32.4 (L) 12/09/2022 1441   HCT 34.2 08/03/2022 0920   PLT 292.0 12/09/2022 1441   PLT 359 08/03/2022 0920   MCV 82.6 12/09/2022 1441   MCV 83 08/03/2022 0920   MCH 25.7 (  L) 08/03/2022 0920   MCH 26.4 09/21/2019 1010   MCHC 32.2 12/09/2022 1441   RDW 14.4 12/09/2022 1441   RDW 14.1 08/03/2022 0920   LYMPHSABS 2.1 08/03/2022 0920   MONOABS 0.7 03/09/2020 1024   EOSABS 0.3 08/03/2022 0920   BASOSABS 0.1 08/03/2022 0920    BMET    Component Value Date/Time   NA 144 12/09/2022 1441   NA 142 08/03/2022 0920   K 3.6 12/09/2022 1441   CL 110 12/09/2022 1441   CO2 29 12/09/2022 1441   GLUCOSE 85 12/09/2022 1441   BUN 25 (H) 12/09/2022 1441   BUN 29 (H) 08/03/2022 0920   CREATININE 0.91 12/09/2022 1441   CREATININE 0.90 09/02/2019 1410   CALCIUM  9.6 12/09/2022 1441   GFRNONAA >60 09/21/2019 1010   GFRAA >60 09/21/2019 1010    BNP No results found for: "BNP"  ProBNP No results found for: "PROBNP"  Specialty Problems       Pulmonary Problems   COPD (chronic  obstructive pulmonary disease) (HCC)   Persistent cough    Allergies  Allergen Reactions   Codeine Nausea Only   Lisinopril Cough   Losartan      cough   Morphine And Codeine Itching   Shellfish Allergy Itching    Immunization History  Administered Date(s) Administered   DTaP 02/10/2015   Fluad Quad(high Dose 65+) 09/09/2018, 10/18/2020, 09/24/2021   Fluad Trivalent(High Dose 65+) 09/09/2022   Influenza, High Dose Seasonal PF 03/11/2018   Moderna SARS-COV2 Booster Vaccination 04/27/2020   Moderna Sars-Covid-2 Vaccination 02/18/2019, 03/18/2019, 11/25/2019   PFIZER Comirnaty(Gray Top)Covid-19 Tri-Sucrose Vaccine 09/24/2021   Pneumococcal Conjugate-13 10/12/2014   Pneumococcal Polysaccharide-23 11/04/2016   RSV,unspecified 09/24/2021   Unspecified SARS-COV-2 Vaccination 09/13/2022   Zoster Recombinant(Shingrix) 08/21/2016, 11/03/2016    Past Medical History:  Diagnosis Date   Acid reflux    Anemia    Arthritis    Asthma    COPD (chronic obstructive pulmonary disease) (HCC)    Diabetes mellitus without complication (HCC)    TYPE 2    Hyperglycemia    Hypertension    Hypokalemia    Hypothyroidism    Obesity    PONV (postoperative nausea and vomiting)    Spinal stenosis    Urinary tract infection, chronic     Tobacco History: Social History   Tobacco Use  Smoking Status Former   Current packs/day: 0.00   Average packs/day: 0.5 packs/day for 20.0 years (10.0 ttl pk-yrs)   Types: Cigarettes   Start date: 01/06/1977   Quit date: 01/06/1997   Years since quitting: 26.3  Smokeless Tobacco Never   Counseling given: Not Answered   Continue to not smoke  Outpatient Encounter Medications as of 05/13/2023  Medication Sig   albuterol  (PROVENTIL ) (2.5 MG/3ML) 0.083% nebulizer solution Take 3 mLs (2.5 mg total) by nebulization every 6 (six) hours as needed for wheezing or shortness of breath.   albuterol  (VENTOLIN  HFA) 108 (90 Base) MCG/ACT inhaler INHALE 2 PUFFS INTO THE  LUNGS EVERY 6 HOURS AS NEEDED FOR WHEEZING OR SHORTNESS OF BREATH   amoxicillin -clavulanate (AUGMENTIN ) 875-125 MG tablet Take 1 tablet by mouth every 12 (twelve) hours.   benzonatate  (TESSALON ) 200 MG capsule Take 200 mg by mouth 3 (three) times daily as needed for cough.   blood glucose meter kit and supplies KIT Check blood sugar with glucometer every morning before your first meal   budesonide -formoterol  (SYMBICORT ) 160-4.5 MCG/ACT inhaler Inhale 2 puffs into the lungs 2 (two) times daily.  cetirizine  (ZYRTEC ) 10 MG tablet Take 1 tablet (10 mg total) by mouth daily. FOR ASTHMA (Patient taking differently: Take 10 mg by mouth daily.)   Cholecalciferol  (VITAMIN D3) 50 MCG (2000 UT) capsule Take 1 capsule (2,000 Units total) by mouth daily. Must keep scheduled follow=-up appt for future refills   cyclobenzaprine  (FLEXERIL ) 10 MG tablet Take by mouth.   estradiol (ESTRACE) 0.1 MG/GM vaginal cream Place vaginally.   gabapentin  (NEURONTIN ) 300 MG capsule Take 1 capsule (300 mg total) by mouth 3 (three) times daily.   GEMTESA 75 MG TABS Take 1 tablet by mouth daily.   hydrOXYzine  (VISTARIL ) 25 MG capsule Take 1 capsule (25 mg total) by mouth 2 (two) times daily as needed.   levothyroxine  (SYNTHROID ) 100 MCG tablet Take 1 tablet (100 mcg total) by mouth daily.   losartan  (COZAAR ) 50 MG tablet Take 1 tablet (50 mg total) by mouth daily. TAKE 1 TABLET(50 MG) BY MOUTH DAILY   meloxicam  (MOBIC ) 15 MG tablet Take 1 tablet (15 mg total) by mouth daily.   Menthol-Methyl Salicylate (SALONPAS PAIN RELIEF PATCH EX) Place 1 patch onto the skin daily as needed (pain).   montelukast  (SINGULAIR ) 10 MG tablet TAKE 1 TABLET(10 MG) BY MOUTH AT BEDTIME   NON FORMULARY DIET- REGULAR CCD   oxybutynin  (DITROPAN ) 5 MG tablet Take 1 tablet (5 mg total) by mouth 2 (two) times daily.   pantoprazole  (PROTONIX ) 40 MG tablet Take 1 tablet (40 mg total) by mouth daily.   predniSONE  (DELTASONE ) 20 MG tablet Take 2 tablets (40 mg  total) by mouth daily with breakfast for 5 days.   predniSONE  (DELTASONE ) 20 MG tablet Take 1 tablet (20 mg total) by mouth daily with breakfast for 5 days.   RESTASIS 0.05 % ophthalmic emulsion 1 drop 2 (two) times daily.   Semaglutide  (RYBELSUS ) 7 MG TABS Take 1 tablet (7 mg total) by mouth daily.   simvastatin  (ZOCOR ) 40 MG tablet Take 1 tablet (40 mg total) by mouth daily at 6 PM. TAKE 1 TABLET(40 MG) BY MOUTH DAILY AT 6 PM   tiZANidine  (ZANAFLEX ) 4 MG tablet Take 0.5-1 tablets (2-4 mg total) by mouth every 8 (eight) hours as needed for muscle spasms.   [DISCONTINUED] BREZTRI  AEROSPHERE 160-9-4.8 MCG/ACT AERO inhaler Inhale 2 puffs into the lungs 2 (two) times daily.   [DISCONTINUED] budesonide -formoterol  (SYMBICORT ) 80-4.5 MCG/ACT inhaler Inhale 2 puffs into the lungs 2 (two) times daily.   No facility-administered encounter medications on file as of 05/13/2023.     Review of Systems  Review of Systems  No chest pain with exertion.  No orthopnea or PND.  Comprehensive review of systems otherwise negative. Physical Exam  BP (!) 142/80 (BP Location: Left Arm, Patient Position: Sitting, Cuff Size: Large)   Pulse 78   Temp 98.2 F (36.8 C) (Oral)   Ht 5\' 3"  (1.6 m)   Wt 192 lb (87.1 kg)   SpO2 98%   BMI 34.01 kg/m   Wt Readings from Last 5 Encounters:  05/13/23 192 lb (87.1 kg)  03/25/23 190 lb 3.2 oz (86.3 kg)  03/24/23 193 lb (87.5 kg)  03/16/23 196 lb (88.9 kg)  03/10/23 196 lb (88.9 kg)    BMI Readings from Last 5 Encounters:  05/13/23 34.01 kg/m  03/25/23 33.69 kg/m  03/24/23 34.19 kg/m  03/16/23 34.72 kg/m  03/10/23 34.72 kg/m     Physical Exam General: Sitting in chair, no acute distress Eyes: EOMI, no icterus Neck: Supple, no JVP Abdomen: Nondistended,  bowel sounds present MSK: No synovitis, no joint effusion Neuro: Normal gait, no weakness Psych: Normal mood, full affect Cardiovascular: Warm, no edema Pulmonary: Clear, no wheeze, normal work of  breathing   Assessment & Plan:   Severe persistent asthma with cough variant features: Likely worsened after recent COVID infection.  Notably multiple bouts of bronchitis documented in the medical record.  Probably worse after COVID infection.  Eosinophils mildly elevated in the past.  Increase Symbicort  to high-dose.  Stop Breztri , do not need to duplicate.  Continue prednisone  from recent ED visit, additional 20 mg daily for 5 days to increase therapy.  Consider addition of biologic in the future if not improving.  Bronchitis, recurrent: Likely reflection of poorly controlled asthma.  Escalate therapies as above.   Return in about 2 months (around 07/13/2023) for f/u Dr. Marygrace Snellen.   Guerry Leek, MD 05/13/2023   This appointment required 61 minutes of patient care (this includes precharting, chart review, review of results, face-to-face care, etc.).

## 2023-05-13 NOTE — Telephone Encounter (Signed)
 Per A. Hermanns, PA-C, "I would switch patient to cefdinir 300mg  BID x 7 days given some reported resistance to augmentin . This would still cover both URI and UTI. Of note, she also has appointment with her pulmonologist as well today. They may add or change something as well for her URI."  Attempted to reach patient x1. Unable to LVM.

## 2023-05-19 DIAGNOSIS — J441 Chronic obstructive pulmonary disease with (acute) exacerbation: Secondary | ICD-10-CM | POA: Diagnosis not present

## 2023-06-02 ENCOUNTER — Other Ambulatory Visit: Payer: Self-pay | Admitting: Internal Medicine

## 2023-06-02 ENCOUNTER — Other Ambulatory Visit: Payer: Self-pay

## 2023-06-02 DIAGNOSIS — I10 Essential (primary) hypertension: Secondary | ICD-10-CM

## 2023-06-11 ENCOUNTER — Ambulatory Visit: Admitting: Internal Medicine

## 2023-06-16 ENCOUNTER — Ambulatory Visit (INDEPENDENT_AMBULATORY_CARE_PROVIDER_SITE_OTHER): Admitting: Internal Medicine

## 2023-06-16 ENCOUNTER — Encounter: Payer: Self-pay | Admitting: Internal Medicine

## 2023-06-16 VITALS — BP 132/76 | HR 88 | Temp 98.4°F | Ht 63.0 in | Wt 195.6 lb

## 2023-06-16 DIAGNOSIS — E039 Hypothyroidism, unspecified: Secondary | ICD-10-CM

## 2023-06-16 DIAGNOSIS — H1045 Other chronic allergic conjunctivitis: Secondary | ICD-10-CM | POA: Diagnosis not present

## 2023-06-16 DIAGNOSIS — H25813 Combined forms of age-related cataract, bilateral: Secondary | ICD-10-CM | POA: Diagnosis not present

## 2023-06-16 DIAGNOSIS — E119 Type 2 diabetes mellitus without complications: Secondary | ICD-10-CM | POA: Diagnosis not present

## 2023-06-16 DIAGNOSIS — H43813 Vitreous degeneration, bilateral: Secondary | ICD-10-CM | POA: Diagnosis not present

## 2023-06-16 DIAGNOSIS — H04123 Dry eye syndrome of bilateral lacrimal glands: Secondary | ICD-10-CM | POA: Diagnosis not present

## 2023-06-16 DIAGNOSIS — R053 Chronic cough: Secondary | ICD-10-CM

## 2023-06-16 DIAGNOSIS — H0102B Squamous blepharitis left eye, upper and lower eyelids: Secondary | ICD-10-CM | POA: Diagnosis not present

## 2023-06-16 DIAGNOSIS — H0102A Squamous blepharitis right eye, upper and lower eyelids: Secondary | ICD-10-CM | POA: Diagnosis not present

## 2023-06-16 LAB — HM DIABETES EYE EXAM

## 2023-06-16 NOTE — Progress Notes (Signed)
   Subjective:   Patient ID: Lindsay [name], female    DOB: 09-Aug-1945, 78 y.o.   MRN: [id number]  HPI The patient is a 78 YO female coming in for follow up/medical management. See A/P. Recent URI with multiple urgent care visits and follow up pulmonary.   Review of Systems  Constitutional:  Positive for fatigue.  HENT: Negative.    Eyes: Negative.   Respiratory:  Negative for cough, chest tightness and shortness of breath.   Cardiovascular:  Negative for chest pain, palpitations and leg swelling.  Gastrointestinal:  Negative for abdominal distention, abdominal pain, constipation, diarrhea, nausea and vomiting.  Musculoskeletal: Negative.   Skin: Negative.   Neurological: Negative.   Psychiatric/Behavioral: Negative.      Objective:  Physical Exam Constitutional:      Appearance: She is well-developed.  HENT:     Head: Normocephalic and atraumatic.   Cardiovascular:     Rate and Rhythm: Normal rate and regular rhythm.  Pulmonary:     Effort: Pulmonary effort is normal. No respiratory distress.     Breath sounds: Normal breath sounds. No wheezing or rales.  Abdominal:     General: Bowel sounds are normal. There is no distension.     Palpations: Abdomen is soft.     Tenderness: There is no abdominal tenderness. There is no rebound.   Musculoskeletal:     Cervical back: Normal range of motion.   Skin:    General: Skin is warm and dry.   Neurological:     Mental Status: She is alert and oriented to person, place, and time.     Coordination: Coordination normal.     Vitals:   06/16/23 0836  BP: 132/76  Pulse: 88  Temp: 98.4 F (36.9 C)  SpO2: 99%  Weight: 195 lb 9.6 oz (88.7 kg)  Height: 5' 3 (1.6 m)    Assessment & Plan:

## 2023-06-19 DIAGNOSIS — J441 Chronic obstructive pulmonary disease with (acute) exacerbation: Secondary | ICD-10-CM | POA: Diagnosis not present

## 2023-06-19 NOTE — Assessment & Plan Note (Signed)
 Endo changed dosing and she did not follow up in expected timeline for TSH and free T4. Checking today. Adjust as needed 100 mcg daily levothyroxine .

## 2023-06-19 NOTE — Assessment & Plan Note (Signed)
 Overall has improved recently with prednisone  courses. She has had dose increase symbicort  which is helping some. Minimal cough today.

## 2023-06-25 DIAGNOSIS — H2511 Age-related nuclear cataract, right eye: Secondary | ICD-10-CM | POA: Diagnosis not present

## 2023-06-29 ENCOUNTER — Ambulatory Visit: Payer: Self-pay | Admitting: Internal Medicine

## 2023-07-18 ENCOUNTER — Other Ambulatory Visit: Payer: Self-pay | Admitting: Internal Medicine

## 2023-07-19 DIAGNOSIS — J441 Chronic obstructive pulmonary disease with (acute) exacerbation: Secondary | ICD-10-CM | POA: Diagnosis not present

## 2023-07-29 ENCOUNTER — Encounter: Payer: Self-pay | Admitting: Pulmonary Disease

## 2023-07-29 ENCOUNTER — Ambulatory Visit: Admitting: Pulmonary Disease

## 2023-07-29 VITALS — BP 124/82 | HR 74 | Temp 97.9°F | Ht 63.0 in | Wt 198.0 lb

## 2023-07-29 DIAGNOSIS — Z87891 Personal history of nicotine dependence: Secondary | ICD-10-CM | POA: Diagnosis not present

## 2023-07-29 DIAGNOSIS — J4540 Moderate persistent asthma, uncomplicated: Secondary | ICD-10-CM

## 2023-07-29 NOTE — Patient Instructions (Signed)
 I am glad you are doing well  Glad the cough is better  Continue Symbicort  as needed  Return to clinic in 6 months or sooner as needed with Dr. [name]

## 2023-07-29 NOTE — Progress Notes (Signed)
 @Patient  ID: Lenward LITTIE Fresh, female    DOB: Aug 26, 1945, 78 y.o.   MRN: [id number]  Chief Complaint  Patient presents with   Medical Management of Chronic Issues    Referring provider: Rollene Almarie LABOR, *  HPI:   78 y.o. woman with history of asthma whom we are seeing for cough, acute on chronic.  Most recent PCP note reviewed.  Returns for routine follow-up.  Ongoing cough or many months weeks.  Concern for poorly controlled asthma and recurrent bronchitis.  Prednisone  prescribed a bit longer.  Cough is markedly improved.  Nearly gone.  Escalated to high-dose Symbicort .  Previous Breztri  no better.  She is using Symbicort  now as needed.  With minimal background cough.  She finds her Symbicort  use as needed to be highly effective.  HPI initial visit: Patient seen in pulmonary clinic in the past most recently 2021.  Started in 2019.  For cough.  Felt to be referred coloration of asthma as well as UA CS.  Seen improved on Symbicort .  Review of records indicate multiple rounds of bronchitis or cough with chest x-rays in the past.  Most recently 05/2023 with clear lungs on chest x-ray, similar 07/2022 with clear lungs bilaterally on my review and interpretation.  It sounds like sometime in the last year or so her inhaler regimen was escalated from low-dose Symbicort  to Breztri .  In fact she is taking both now it seems.  No real change in the cough.  03/2023 she reports being diagnosed with COVID.  Persistent cough.  Gradually got better.  Then about 3 weeks ago cough returned.  Productive.  Brings up phlegm.  Clear.  All times a day.  Sometimes worse at night.  Seen in the ED as above 05/11/2023 with clear chest x-ray.  Prescribed doxycycline  and prednisone .  She is not sure how much it is helping albeit less than 48 hours.  We discussed at length asthma.  Recurrent bronchitis seen in the past.  Likely demonstration of poorly controlled asthma.  Discussed role and rationale for maintenance  inhaler.  Discussed role and rationale for possible Biologics in the future.  Discussed that with recent COVID infection likely worsened postviral.  Notably eosinophils 300 07/2022.  Questionaires / Pulmonary Flowsheets:   ACT:  Asthma Control Test ACT Total Score  04/06/2020 11:07 AM 14  03/09/2020  9:54 AM 18    MMRC:     No data to display          Epworth:      No data to display          Tests:   FENO:  No results found for: NITRICOXIDE  PFT:     No data to display          WALK:      No data to display          Imaging: Personally reviewed and as per EMR and discussion in this note No results found.   Lab Results: Personally reviewed CBC    Component Value Date/Time   WBC 8.1 12/09/2022 1441   RBC 3.92 12/09/2022 1441   HGB 10.4 (L) 12/09/2022 1441   HGB 10.6 (L) 08/03/2022 0920   HCT 32.4 (L) 12/09/2022 1441   HCT 34.2 08/03/2022 0920   PLT 292.0 12/09/2022 1441   PLT 359 08/03/2022 0920   MCV 82.6 12/09/2022 1441   MCV 83 08/03/2022 0920   MCH 25.7 (L) 08/03/2022 0920   MCH 26.4 09/21/2019 1010  MCHC 32.2 12/09/2022 1441   RDW 14.4 12/09/2022 1441   RDW 14.1 08/03/2022 0920   LYMPHSABS 2.1 08/03/2022 0920   MONOABS 0.7 03/09/2020 1024   EOSABS 0.3 08/03/2022 0920   BASOSABS 0.1 08/03/2022 0920    BMET    Component Value Date/Time   NA 144 12/09/2022 1441   NA 142 08/03/2022 0920   K 3.6 12/09/2022 1441   CL 110 12/09/2022 1441   CO2 29 12/09/2022 1441   GLUCOSE 85 12/09/2022 1441   BUN 25 (H) 12/09/2022 1441   BUN 29 (H) 08/03/2022 0920   CREATININE 0.91 12/09/2022 1441   CREATININE 0.90 09/02/2019 1410   CALCIUM  9.6 12/09/2022 1441   GFRNONAA >60 09/21/2019 1010   GFRAA >60 09/21/2019 1010    BNP No results found for: BNP  ProBNP No results found for: PROBNP  Specialty Problems       Pulmonary Problems   Persistent cough    Allergies  Allergen Reactions   Codeine Nausea Only   Lisinopril Cough    Losartan      cough   Morphine And Codeine Itching   Shellfish Allergy Itching    Immunization History  Administered Date(s) Administered   DTaP 02/10/2015   Fluad Quad(high Dose 65+) 09/09/2018, 10/18/2020, 09/24/2021   Fluad Trivalent(High Dose 65+) 09/09/2022   Influenza, High Dose Seasonal PF 03/11/2018   Moderna SARS-COV2 Booster Vaccination 04/27/2020   Moderna Sars-Covid-2 Vaccination 02/18/2019, 03/18/2019, 11/25/2019   PFIZER Comirnaty(Gray Top)Covid-19 Tri-Sucrose Vaccine 09/24/2021   Pneumococcal Conjugate-13 10/12/2014   Pneumococcal Polysaccharide-23 11/04/2016   RSV,unspecified 09/24/2021   Unspecified SARS-COV-2 Vaccination 09/13/2022   Zoster Recombinant(Shingrix) 08/21/2016, 11/03/2016    Past Medical History:  Diagnosis Date   Acid reflux    Anemia    Arthritis    Asthma    COPD (chronic obstructive pulmonary disease) (HCC)    Diabetes mellitus without complication (HCC)    TYPE 2    Hyperglycemia    Hypertension    Hypokalemia    Hypothyroidism    Obesity    PONV (postoperative nausea and vomiting)    Spinal stenosis    Urinary tract infection, chronic     Tobacco History: Social History   Tobacco Use  Smoking Status Former   Current packs/day: 0.00   Average packs/day: 0.5 packs/day for 20.0 years (10.0 ttl pk-yrs)   Types: Cigarettes   Start date: 01/06/1977   Quit date: 01/06/1997   Years since quitting: 26.5  Smokeless Tobacco Never   Counseling given: Not Answered   Continue to not smoke  Outpatient Encounter Medications as of 07/29/2023  Medication Sig   albuterol  (PROVENTIL ) (2.5 MG/3ML) 0.083% nebulizer solution Take 3 mLs (2.5 mg total) by nebulization every 6 (six) hours as needed for wheezing or shortness of breath.   albuterol  (VENTOLIN  HFA) 108 (90 Base) MCG/ACT inhaler INHALE 2 PUFFS INTO THE LUNGS EVERY 6 HOURS AS NEEDED FOR WHEEZING OR SHORTNESS OF BREATH   benzonatate  (TESSALON ) 200 MG capsule Take 200 mg by mouth 3  (three) times daily as needed for cough.   blood glucose meter kit and supplies KIT Check blood sugar with glucometer every morning before your first meal   budesonide -formoterol  (SYMBICORT ) 160-4.5 MCG/ACT inhaler Inhale 2 puffs into the lungs 2 (two) times daily.   cetirizine  (ZYRTEC ) 10 MG tablet Take 1 tablet (10 mg total) by mouth daily. FOR ASTHMA (Patient taking differently: Take 10 mg by mouth daily.)   Cholecalciferol  (VITAMIN D3) 50 MCG (2000 UT) capsule  Take 1 capsule (2,000 Units total) by mouth daily. Must keep scheduled follow=-up appt for future refills   cyclobenzaprine  (FLEXERIL ) 10 MG tablet Take by mouth.   estradiol (ESTRACE) 0.1 MG/GM vaginal cream Place vaginally.   gabapentin  (NEURONTIN ) 300 MG capsule Take 1 capsule (300 mg total) by mouth 3 (three) times daily.   GEMTESA 75 MG TABS Take 1 tablet by mouth daily.   hydrOXYzine  (VISTARIL ) 25 MG capsule Take 1 capsule (25 mg total) by mouth 2 (two) times daily as needed.   levothyroxine  (SYNTHROID ) 100 MCG tablet Take 1 tablet (100 mcg total) by mouth daily.   losartan  (COZAAR ) 50 MG tablet TAKE 1 TABLET(50 MG) BY MOUTH DAILY   meloxicam  (MOBIC ) 15 MG tablet Take 1 tablet (15 mg total) by mouth daily.   Menthol-Methyl Salicylate (SALONPAS PAIN RELIEF PATCH EX) Place 1 patch onto the skin daily as needed (pain).   montelukast  (SINGULAIR ) 10 MG tablet TAKE 1 TABLET(10 MG) BY MOUTH AT BEDTIME   NON FORMULARY DIET- REGULAR CCD   oxybutynin  (DITROPAN ) 5 MG tablet Take 1 tablet (5 mg total) by mouth 2 (two) times daily.   pantoprazole  (PROTONIX ) 40 MG tablet Take 1 tablet (40 mg total) by mouth daily.   RESTASIS 0.05 % ophthalmic emulsion 1 drop 2 (two) times daily.   Semaglutide  (RYBELSUS ) 7 MG TABS Take 1 tablet (7 mg total) by mouth daily.   simvastatin  (ZOCOR ) 40 MG tablet Take 1 tablet (40 mg total) by mouth daily at 6 PM. TAKE 1 TABLET(40 MG) BY MOUTH DAILY AT 6 PM   tiZANidine  (ZANAFLEX ) 4 MG tablet Take 0.5-1 tablets (2-4  mg total) by mouth every 8 (eight) hours as needed for muscle spasms.   amoxicillin -clavulanate (AUGMENTIN ) 875-125 MG tablet Take 1 tablet by mouth every 12 (twelve) hours. (Patient not taking: Reported on 07/29/2023)   No facility-administered encounter medications on file as of 07/29/2023.     Review of Systems  Review of Systems  N/a Physical Exam  BP 124/82   Pulse 74   Temp 97.9 F (36.6 C) (Temporal)   Ht 5' 3 (1.6 m)   Wt 198 lb (89.8 kg)   SpO2 98%   BMI 35.07 kg/m   Wt Readings from Last 5 Encounters:  07/29/23 198 lb (89.8 kg)  06/16/23 195 lb 9.6 oz (88.7 kg)  05/13/23 192 lb (87.1 kg)  03/25/23 190 lb 3.2 oz (86.3 kg)  03/24/23 193 lb (87.5 kg)    BMI Readings from Last 5 Encounters:  07/29/23 35.07 kg/m  06/16/23 34.65 kg/m  05/13/23 34.01 kg/m  03/25/23 33.69 kg/m  03/24/23 34.19 kg/m     Physical Exam General: Sitting in chair, no acute distress Eyes: EOMI, no icterus Neck: Supple, no JVP Abdomen: Nondistended, bowel sounds present MSK: No synovitis, no joint effusion Neuro: Normal gait, no weakness Psych: Normal mood, full affect Cardiovascular: Warm, no edema Pulmonary: Clear, no wheeze, normal work of breathing   Assessment & Plan:   Severe persistent asthma with cough variant features: Likely worsened after recent COVID infection.  Notably multiple bouts of bronchitis documented in the medical record.  Probably worse after COVID infection.  Eosinophils mildly elevated in the past.  No better with Breztri .  Increase to Symbicort  high-dose.  Add additional prednisone  prescribed last visit.  Marked improvement in symptoms.  Continue Symbicort  as needed, using infrequently.  Bronchitis, recurrent: Likely reflection of poorly controlled asthma.  Marked improvement, using Symbicort  as needed.   Return in about 6  months (around 01/29/2024) for f/u Dr. [name].   Donnice JONELLE [name], MD 07/29/2023

## 2023-08-03 DIAGNOSIS — H2512 Age-related nuclear cataract, left eye: Secondary | ICD-10-CM | POA: Diagnosis not present

## 2023-08-06 DIAGNOSIS — H2512 Age-related nuclear cataract, left eye: Secondary | ICD-10-CM | POA: Diagnosis not present

## 2023-08-12 ENCOUNTER — Other Ambulatory Visit: Payer: Self-pay | Admitting: Internal Medicine

## 2023-08-12 DIAGNOSIS — J22 Unspecified acute lower respiratory infection: Secondary | ICD-10-CM

## 2023-08-12 DIAGNOSIS — E785 Hyperlipidemia, unspecified: Secondary | ICD-10-CM

## 2023-08-19 DIAGNOSIS — J441 Chronic obstructive pulmonary disease with (acute) exacerbation: Secondary | ICD-10-CM | POA: Diagnosis not present

## 2023-08-24 ENCOUNTER — Ambulatory Visit
Admission: EM | Admit: 2023-08-24 | Discharge: 2023-08-24 | Disposition: A | Discharge status: Home / Self Care | Attending: Nurse Practitioner | Admitting: Nurse Practitioner

## 2023-08-24 ENCOUNTER — Encounter: Payer: Self-pay | Admitting: Emergency Medicine

## 2023-08-24 ENCOUNTER — Telehealth: Payer: Self-pay | Admitting: Emergency Medicine

## 2023-08-24 DIAGNOSIS — N3000 Acute cystitis without hematuria: Secondary | ICD-10-CM | POA: Insufficient documentation

## 2023-08-24 DIAGNOSIS — R300 Dysuria: Secondary | ICD-10-CM | POA: Diagnosis not present

## 2023-08-24 DIAGNOSIS — N898 Other specified noninflammatory disorders of vagina: Secondary | ICD-10-CM | POA: Diagnosis present

## 2023-08-24 LAB — POCT URINE DIPSTICK
Bilirubin, UA: NEGATIVE
Blood, UA: NEGATIVE
Glucose, UA: NEGATIVE mg/dL
Ketones, POC UA: NEGATIVE mg/dL
Nitrite, UA: POSITIVE — AB
POC PROTEIN,UA: NEGATIVE
Spec Grav, UA: 1.02 (ref 1.010–1.025)
Urobilinogen, UA: 0.2 U/dL
pH, UA: 6 (ref 5.0–8.0)

## 2023-08-24 MED ORDER — FLUCONAZOLE 150 MG PO TABS: 150.0000 mg | ORAL_TABLET | ORAL | 0 refills | Status: AC

## 2023-08-24 MED ORDER — CIPROFLOXACIN HCL 500 MG PO TABS: 500.0000 mg | ORAL_TABLET | Freq: Two times a day (BID) | ORAL | 0 refills | Status: AC

## 2023-08-24 MED ORDER — NITROFURANTOIN MONOHYD MACRO 100 MG PO CAPS: 100.0000 mg | ORAL_CAPSULE | Freq: Two times a day (BID) | ORAL | 0 refills | Status: DC

## 2023-08-24 NOTE — Discharge Instructions (Addendum)
 You were seen today for symptoms consistent with a urinary tract infection, including foul-smelling dark urine, mild burning with urination, frequent urination, and right lower back discomfort. Because you had a previous infection in May caused by Citrobacter koseri. You were prescribed ciprofloxacin , which you should take twice daily for five days. A urine culture was sent to confirm the type of bacteria and ensure the antibiotic is effective. You will be notified of the results, and your treatment may be adjusted if necessary. It is important to drink plenty of fluids to help flush bacteria from your urinary tract. You may also use a heating pad on your lower abdomen or back to ease discomfort.  You also reported mild vaginal itching, which may be a yeast infection possibly related to antibiotic use. You were prescribed fluconazole  150 mg, with one dose to take now and a second dose in three days. Wearing loose cotton underwear, avoiding scented soaps or products, and keeping the area clean and dry may help relieve symptoms.  Follow up with your primary care provider if your urinary symptoms or vaginal itching do not improve or if they worsen after completing your medications. Go to the emergency department right away if you develop fever, chills, severe flank or abdominal pain, nausea, vomiting, blood in your urine, or if you are unable to urinate, as these may be signs of a more serious infection.

## 2023-08-24 NOTE — Telephone Encounter (Signed)
 Called pharmacy to confirm the change in RX sent to them on behalf of Lindsay [name]. Pharmacy did not answer. Unable to LVM.

## 2023-08-24 NOTE — ED Provider Notes (Signed)
 [facility]    CSN: [id number] Arrival date & time: 08/24/23  0906      History   Chief Complaint Chief Complaint  Patient presents with   UTI symptoms    HPI Lindsay [name] is a 78 y.o. female.   Discussed the use of AI scribe software for clinical note transcription with the patient, who gave verbal consent to proceed.   The patient presents with foul-smelling, dark-colored urine and mild vaginal itching for approximately one month. The patient also reports experiencing some burning during urination and an increased urge to urinate. She reports that she is drinking plenty of water , but the urine continues to have a bad odor. The patient reports feeling unwell yesterday after returning home from church along with right lower side back pain, which was alleviated by taking Tylenol , Patient denies specific symptoms of fever, chills, or body aches.  Of note, the patient was diagnosed with a urinary tract infection in May, with urine culture positive for Citrobacter koseri. She was initially prescribed Augmentin  for concurrent upper respiratory and urinary symptoms, which was later transitioned to Cefdinir  to provide continued coverage for both conditions. The patient reports completing the full course of antibiotics with complete resolution of her symptoms.  The following portions of the patient's history were reviewed and updated as appropriate: allergies, current medications, past family history, past medical history, past social history, past surgical history, and problem list.             Past Medical History:  Diagnosis Date   Acid reflux    Anemia    Arthritis    Asthma    COPD (chronic obstructive pulmonary disease) (HCC)    Diabetes mellitus without complication (HCC)    TYPE 2    Hyperglycemia    Hypertension    Hypokalemia    Hypothyroidism    Obesity    PONV (postoperative nausea and vomiting)    Spinal stenosis    Urinary tract infection,  chronic     Patient Active Problem List   Diagnosis Date Noted   Low back pain 03/10/2023   Persistent cough 07/15/2022   Other fatigue 07/19/2021   Eczema 10/24/2020   Status post total knee replacement, right 09/30/2019   Routine general medical examination at a health care facility 09/05/2019   Anemia, unspecified 07/28/2019   Hypothyroidism 01/12/2019   Ganglion of flexor tendon sheath of right middle finger 07/15/2018   Type 2 diabetes mellitus with complication (HCC) 02/11/2018   Essential hypertension 02/11/2018   Hyperlipidemia associated with type 2 diabetes mellitus (HCC) 02/11/2018   Urinary tract infection, chronic     Past Surgical History:  Procedure Laterality Date   ABDOMINAL HYSTERECTOMY     ANKLE FUSION Left    CHOLECYSTECTOMY     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   Procedure: TOTAL KNEE ARTHROPLASTY;  Surgeon: Yvone Rush, MD;  Location: [facility];  Service: Orthopedics;  Laterality: Right;    OB History   No obstetric history on file.      Home Medications    Prior to Admission medications   Medication Sig Start Date End Date Taking? Authorizing Provider  ciprofloxacin  (CIPRO ) 500 MG tablet Take 1 tablet (500 mg total) by mouth every 12 (twelve) hours for 5 days. 08/24/23 08/29/23 Yes Iola Lukes, FNP  fluconazole  (DIFLUCAN ) 150 MG tablet Take 1 tablet (150 mg total) by mouth every 3 (three) days for 2 doses. 08/24/23 08/28/23 Yes Iola Lukes, FNP  albuterol  (PROVENTIL ) (  2.5 MG/3ML) 0.083% nebulizer solution Take 3 mLs (2.5 mg total) by nebulization every 6 (six) hours as needed for wheezing or shortness of breath. 09/09/22   Rollene Almarie LABOR, MD  albuterol  (VENTOLIN  HFA) [id number] Base) MCG/ACT inhaler INHALE 2 PUFFS INTO THE LUNGS EVERY 6 HOURS AS NEEDED FOR WHEEZING OR SHORTNESS OF BREATH 07/20/23   Rollene Almarie LABOR, MD  benzonatate  (TESSALON ) 200 MG capsule Take 200 mg by mouth 3 (three) times daily as needed for cough.    [provider]  blood glucose meter kit and supplies KIT Check blood sugar with glucometer every morning before your first meal 12/27/21   Elnor Lauraine BRAVO, NP  budesonide -formoterol  (SYMBICORT ) 160-4.5 MCG/ACT inhaler Inhale 2 puffs into the lungs 2 (two) times daily. 05/13/23   Hunsucker, [name], MD  cetirizine  (ZYRTEC ) 10 MG tablet Take 1 tablet (10 mg total) by mouth daily. FOR ASTHMA Patient taking differently: Take 10 mg by mouth daily. 09/05/19   Hope Almarie ORN, NP  Cholecalciferol  (VITAMIN D3) 50 MCG (2000 UT) capsule Take 1 capsule (2,000 Units total) by mouth daily. Must keep scheduled follow=-up appt for future refills 08/06/19   Medina-Vargas, Monina C, NP  estradiol (ESTRACE) 0.1 MG/GM vaginal cream Place vaginally. 04/14/23   [provider]  gabapentin  (NEURONTIN ) 300 MG capsule Take 1 capsule (300 mg total) by mouth 3 (three) times daily. 09/09/22   Rollene Almarie LABOR, MD  GEMTESA 75 MG TABS Take 1 tablet by mouth daily. 04/14/23   [provider]  hydrOXYzine  (VISTARIL ) 25 MG capsule Take 1 capsule (25 mg total) by mouth 2 (two) times daily as needed. 09/09/22   Rollene Almarie LABOR, MD  levothyroxine  (SYNTHROID ) 100 MCG tablet Take 1 tablet (100 mcg total) by mouth daily. 09/19/22   Shamleffer, Ibtehal Jaralla, MD  losartan  (COZAAR ) 50 MG tablet TAKE 1 TABLET(50 MG) BY MOUTH DAILY 06/05/23   Rollene Almarie LABOR, MD  meloxicam  (MOBIC ) 15 MG tablet Take 1 tablet (15 mg total) by mouth daily. 03/10/23   Rollene Almarie LABOR, MD  Menthol-Methyl Salicylate (SALONPAS PAIN RELIEF PATCH EX) Place 1 patch onto the skin daily as needed (pain).    [provider]  montelukast  (SINGULAIR ) 10 MG tablet TAKE 1 TABLET(10 MG) BY MOUTH AT BEDTIME 08/12/23   Rollene Almarie LABOR, MD  NON FORMULARY DIET- REGULAR CCD 07/27/19   [provider]  pantoprazole  (PROTONIX ) 40 MG tablet Take 1 tablet (40 mg total) by mouth daily. 10/13/22   Rollene Almarie LABOR, MD  RESTASIS  0.05 % ophthalmic emulsion 1 drop 2 (two) times daily. 04/28/23   [provider]  Semaglutide  (RYBELSUS ) 7 MG TABS Take 1 tablet (7 mg total) by mouth daily. 03/10/23   Rollene Almarie LABOR, MD  simvastatin  (ZOCOR ) 40 MG tablet TAKE 1 TABLET(40 MG) BY MOUTH DAILY AT 6 PM 08/12/23   Rollene Almarie LABOR, MD  tiZANidine  (ZANAFLEX ) 4 MG tablet Take 0.5-1 tablets (2-4 mg total) by mouth every 8 (eight) hours as needed for muscle spasms. 02/13/23   Rollene Almarie LABOR, MD    Family History Family History  Problem Relation Age of Onset   Asthma Daughter    Microcephaly Maternal Grandmother    Heart disease Maternal Grandmother        Not clear details, patient just knows she took heart pills   BRCA 1/2 Neg Hx    Breast cancer Neg Hx     Social History Social History   Tobacco Use  Smoking status: Former    Current packs/day: 0.00    Average packs/day: 0.5 packs/day for 20.0 years (10.0 ttl pk-yrs)    Types: Cigarettes    Start date: 01/06/1977    Quit date: 01/06/1997    Years since quitting: 26.6   Smokeless tobacco: Never  Vaping Use   Vaping status: Never Used  Substance Use Topics   Alcohol  use: No   Drug use: No     Allergies   Codeine, Lisinopril, Losartan , Morphine and codeine, and Shellfish allergy   Review of Systems Review of Systems  Constitutional:  Negative for fever.  Gastrointestinal:  Negative for abdominal pain, nausea and vomiting.  Genitourinary:  Positive for dysuria, frequency and urgency.       Odor to urine. Dark colored urine. Vaginal itching.   Musculoskeletal:  Positive for back pain (right lower).  All other systems reviewed and are negative.    Physical Exam Triage Vital Signs ED Triage Vitals [08/24/23 0946]  Encounter Vitals Group     BP 130/69     Girls Systolic BP Percentile      Girls Diastolic BP Percentile      Boys Systolic BP Percentile      Boys Diastolic BP Percentile      Pulse Rate 72     Resp 20     Temp 98.5 F  (36.9 C)     Temp Source Oral     SpO2 94 %     Weight      Height      Head Circumference      Peak Flow      Pain Score 0     Pain Loc      Pain Education      Exclude from Growth Chart    No data found.  Updated Vital Signs BP 130/69 (BP Location: Left Arm)   Pulse 72   Temp 98.5 F (36.9 C) (Oral)   Resp 20   SpO2 94%   Visual Acuity Right Eye Distance:   Left Eye Distance:   Bilateral Distance:    Right Eye Near:   Left Eye Near:    Bilateral Near:     Physical Exam   UC Treatments / Results  Labs (all labs ordered are listed, but only abnormal results are displayed) Labs Reviewed  POCT URINE DIPSTICK - Abnormal; Notable for the following components:      Result Value   Clarity, UA cloudy (*)    Nitrite, UA Positive (*)    Leukocytes, UA Small (1+) (*)    All other components within normal limits  URINE CULTURE    EKG   Radiology No results found.  Procedures Procedures (including critical care time)  Medications Ordered in UC Medications - No data to display  Initial Impression / Assessment and Plan / UC Course  I have reviewed the triage vital signs and the nursing notes.  Pertinent labs & imaging results that were available during my care of the patient were reviewed by me and considered in my medical decision making (see chart for details).     The patient presents with a one-month history of foul-smelling, dark urine associated with mild dysuria, urinary frequency, and right lower back pain, consistent with a urinary tract infection. She has a history of UTI in May caused by *Citrobacter koseri*. Nitrofurantoin  was initially prescribed, but given her prior culture results, it was discontinued and replaced with ciprofloxacin  twice daily for five days. A urine  culture was obtained to confirm the causative organism and antibiotic sensitivity. She was advised to maintain adequate hydration and will be notified of results, with treatment  adjustments made if needed.  The patient also reports mild vaginal itching that may represent a yeast infection, possibly related to recent or ongoing antibiotic use. Fluconazole  150 mg was prescribed, with one dose to be taken immediately and another in three days. She was counseled on signs and symptoms of yeast infections and advised to follow up with her primary care provider if symptoms persist or worsen. Emergency evaluation is recommended if she develops fever, severe flank pain, nausea, vomiting, or systemic symptoms suggestive of a complicated infection.  Today's evaluation has revealed no signs of a dangerous process. Discussed diagnosis with patient and/or guardian. Patient and/or guardian aware of their diagnosis, possible red flag symptoms to watch out for and need for close follow up. Patient and/or guardian understands verbal and written discharge instructions. Patient and/or guardian comfortable with plan and disposition.  Patient and/or guardian has a clear mental status at this time, good insight into illness (after discussion and teaching) and has clear judgment to make decisions regarding their care  Documentation was completed with the aid of voice recognition software. Transcription may contain typographical errors. Final Clinical Impressions(s) / UC Diagnoses   Final diagnoses:  Dysuria  Acute cystitis without hematuria  Itching in the vaginal area     Discharge Instructions      You were seen today for symptoms consistent with a urinary tract infection, including foul-smelling dark urine, mild burning with urination, frequent urination, and right lower back discomfort. Because you had a previous infection in May caused by Citrobacter koseri. You were prescribed ciprofloxacin , which you should take twice daily for five days. A urine culture was sent to confirm the type of bacteria and ensure the antibiotic is effective. You will be notified of the results, and your treatment  may be adjusted if necessary. It is important to drink plenty of fluids to help flush bacteria from your urinary tract. You may also use a heating pad on your lower abdomen or back to ease discomfort.  You also reported mild vaginal itching, which may be a yeast infection possibly related to antibiotic use. You were prescribed fluconazole  150 mg, with one dose to take now and a second dose in three days. Wearing loose cotton underwear, avoiding scented soaps or products, and keeping the area clean and dry may help relieve symptoms.  Follow up with your primary care provider if your urinary symptoms or vaginal itching do not improve or if they worsen after completing your medications. Go to the emergency department right away if you develop fever, chills, severe flank or abdominal pain, nausea, vomiting, blood in your urine, or if you are unable to urinate, as these may be signs of a more serious infection.      ED Prescriptions     Medication Sig Dispense Auth. Provider   fluconazole  (DIFLUCAN ) 150 MG tablet Take 1 tablet (150 mg total) by mouth every 3 (three) days for 2 doses. 2 tablet Iola Lukes, FNP   nitrofurantoin , macrocrystal-monohydrate, (MACROBID ) 100 MG capsule  (Status: Discontinued) Take 1 capsule (100 mg total) by mouth 2 (two) times daily. 10 capsule Iola Lukes, FNP   ciprofloxacin  (CIPRO ) 500 MG tablet Take 1 tablet (500 mg total) by mouth every 12 (twelve) hours for 5 days. 10 tablet Iola Lukes, FNP      PDMP not reviewed this encounter.  Iola Lukes, [place] 08/24/23 1121

## 2023-08-24 NOTE — ED Triage Notes (Signed)
 PT st's she thinks she has a UTI  c/o foul smelling urine, st's urine is very dark  Also c/o vaginal itching after urinating

## 2023-08-26 ENCOUNTER — Ambulatory Visit: Payer: Self-pay

## 2023-08-26 LAB — URINE CULTURE: Culture: >=100000 — AB

## 2023-09-03 ENCOUNTER — Other Ambulatory Visit: Payer: Self-pay | Admitting: Internal Medicine

## 2023-09-15 ENCOUNTER — Encounter: Payer: Self-pay | Admitting: Internal Medicine

## 2023-09-15 ENCOUNTER — Ambulatory Visit: Admitting: Internal Medicine

## 2023-09-15 VITALS — BP 122/80 | HR 63 | Temp 98.1°F | Ht 63.0 in | Wt 197.0 lb

## 2023-09-15 DIAGNOSIS — I10 Essential (primary) hypertension: Secondary | ICD-10-CM | POA: Diagnosis not present

## 2023-09-15 DIAGNOSIS — E039 Hypothyroidism, unspecified: Secondary | ICD-10-CM

## 2023-09-15 DIAGNOSIS — Z0000 Encounter for general adult medical examination without abnormal findings: Secondary | ICD-10-CM

## 2023-09-15 DIAGNOSIS — Z7984 Long term (current) use of oral hypoglycemic drugs: Secondary | ICD-10-CM

## 2023-09-15 DIAGNOSIS — J22 Unspecified acute lower respiratory infection: Secondary | ICD-10-CM | POA: Diagnosis not present

## 2023-09-15 DIAGNOSIS — E1169 Type 2 diabetes mellitus with other specified complication: Secondary | ICD-10-CM

## 2023-09-15 DIAGNOSIS — E785 Hyperlipidemia, unspecified: Secondary | ICD-10-CM | POA: Diagnosis not present

## 2023-09-15 DIAGNOSIS — R040 Epistaxis: Secondary | ICD-10-CM | POA: Diagnosis not present

## 2023-09-15 DIAGNOSIS — E118 Type 2 diabetes mellitus with unspecified complications: Secondary | ICD-10-CM

## 2023-09-15 LAB — LIPID PANEL
Cholesterol: 147 mg/dL (ref 0–200)
HDL: 41.1 mg/dL (ref >39.00)
LDL Cholesterol: 97 mg/dL (ref 0–99)
NonHDL: 106.05
Total CHOL/HDL Ratio: 4
Triglycerides: 46 mg/dL (ref 0.0–149.0)
VLDL: 9.2 mg/dL (ref 0.0–40.0)

## 2023-09-15 LAB — CBC
HCT: 34.3 % — ABNORMAL LOW (ref 36.0–46.0)
Hemoglobin: 11 g/dL — ABNORMAL LOW (ref 12.0–15.0)
MCHC: 32.1 g/dL (ref 30.0–36.0)
MCV: 81.3 fl (ref 78.0–100.0)
Platelets: 325 K/uL (ref 150.0–400.0)
RBC: 4.22 Mil/uL (ref 3.87–5.11)
RDW: 14.2 % (ref 11.5–15.5)
WBC: 7.5 K/uL (ref 4.0–10.5)

## 2023-09-15 LAB — COMPREHENSIVE METABOLIC PANEL WITH GFR
ALT: 10 U/L (ref 0–35)
AST: 12 U/L (ref 0–37)
Albumin: 4.1 g/dL (ref 3.5–5.2)
Alkaline Phosphatase: 54 U/L (ref 39–117)
BUN: 17 mg/dL (ref 6–23)
CO2: 29 meq/L (ref 19–32)
Calcium: 10.2 mg/dL (ref 8.4–10.5)
Chloride: 106 meq/L (ref 96–112)
Creatinine, Ser: 0.71 mg/dL (ref 0.40–1.20)
GFR: 82.71 mL/min (ref >60.00)
Glucose, Bld: 81 mg/dL (ref 70–99)
Potassium: 3.6 meq/L (ref 3.5–5.1)
Sodium: 141 meq/L (ref 135–145)
Total Bilirubin: 0.3 mg/dL (ref 0.2–1.2)
Total Protein: 7.3 g/dL (ref 6.0–8.3)

## 2023-09-15 LAB — HEMOGLOBIN A1C: Hgb A1c MFr Bld: 6.2 % (ref 4.6–6.5)

## 2023-09-15 LAB — MICROALBUMIN / CREATININE URINE RATIO
Creatinine,U: 94.4 mg/dL
Microalb Creat Ratio: 22.3 mg/g (ref 0.0–30.0)
Microalb, Ur: 2.1 mg/dL — ABNORMAL HIGH (ref 0.0–1.9)

## 2023-09-15 LAB — TSH: TSH: 0.18 u[IU]/mL — ABNORMAL LOW (ref 0.35–5.50)

## 2023-09-15 LAB — VITAMIN D 25 HYDROXY (VIT D DEFICIENCY, FRACTURES): VITD: 39.44 ng/mL (ref 30.00–100.00)

## 2023-09-15 MED ORDER — MONTELUKAST SODIUM 10 MG PO TABS: ORAL_TABLET | ORAL | 3 refills | Status: AC

## 2023-09-15 MED ORDER — PANTOPRAZOLE SODIUM 40 MG PO TBEC: 40.0000 mg | DELAYED_RELEASE_TABLET | Freq: Every day | ORAL | 3 refills | Status: AC

## 2023-09-15 MED ORDER — SIMVASTATIN 40 MG PO TABS: 40.0000 mg | ORAL_TABLET | Freq: Every day | ORAL | 3 refills | Status: AC

## 2023-09-15 MED ORDER — TIZANIDINE HCL 4 MG PO TABS: 2.0000 mg | ORAL_TABLET | Freq: Three times a day (TID) | ORAL | 1 refills | Status: DC | PRN

## 2023-09-15 MED ORDER — HYDROXYZINE PAMOATE 25 MG PO CAPS: 25.0000 mg | ORAL_CAPSULE | Freq: Two times a day (BID) | ORAL | 2 refills | Status: AC | PRN

## 2023-09-15 NOTE — Patient Instructions (Signed)
 We will get you in with the ear nose and throat doctor to fix that bleeding spot in your nose.

## 2023-09-15 NOTE — Progress Notes (Unsigned)
   Subjective:   Patient ID: Lindsay [name], female    DOB: [birth year], 78 y.o.   MRN: [id number]  The patient is here for physical. Pertinent topics discussed: Discussed the use of AI scribe software for clinical note transcription with the patient, who gave verbal consent to proceed.  History of Present Illness Lindsay [name] is a 78 year old female who presents with nasal bleeding and congestion.  She experiences ongoing nasal congestion and epistaxis. The epistaxis is frequent, with clots forming in her nose, particularly on the left side. She has been using Zyrtec  for her allergies but feels it is not adequately addressing the issue. She is concerned about the bleeding and the size of the clots, noting that it is 'getting on my nerves' and 'scares me'.  She experiences numbness in her hand and cramping in her leg. The numbness is significant, affecting her fingers and causing discomfort.  PMH, [facility], social history reviewed and updated  Review of Systems  Constitutional: Negative.   HENT: Negative.    Eyes: Negative.   Respiratory:  Negative for cough, chest tightness and shortness of breath.   Cardiovascular:  Negative for chest pain, palpitations and leg swelling.  Gastrointestinal:  Negative for abdominal distention, abdominal pain, constipation, diarrhea, nausea and vomiting.  Musculoskeletal:  Positive for arthralgias.  Skin: Negative.   Neurological:  Positive for numbness.       Left hand  Psychiatric/Behavioral: Negative.      Objective:  Physical Exam Constitutional:      Appearance: She is well-developed.  HENT:     Head: Normocephalic and atraumatic.  Cardiovascular:     Rate and Rhythm: Normal rate and regular rhythm.  Pulmonary:     Effort: Pulmonary effort is normal. No respiratory distress.     Breath sounds: Normal breath sounds. No wheezing or rales.  Abdominal:     General: Bowel sounds are normal. There is no distension.     Palpations: Abdomen is  soft.     Tenderness: There is no abdominal tenderness.  Musculoskeletal:        General: Tenderness present.     Cervical back: Normal range of motion.  Skin:    General: Skin is warm and dry.     Comments: Foot exam done  Neurological:     Mental Status: She is alert and oriented to person, place, and time.     Coordination: Coordination normal.     Vitals:   09/15/23 0834  BP: 122/80  Pulse: 63  Temp: 98.1 F (36.7 C)  TempSrc: Oral  SpO2: 93%  Weight: 197 lb (89.4 kg)  Height: 5' 3 (1.6 m)    Assessment & Plan:

## 2023-09-17 ENCOUNTER — Encounter: Payer: Self-pay | Admitting: Internal Medicine

## 2023-09-17 DIAGNOSIS — R040 Epistaxis: Secondary | ICD-10-CM | POA: Insufficient documentation

## 2023-09-17 NOTE — Assessment & Plan Note (Signed)
 BP at goal on current regimen checking CMP and adjust as needed.

## 2023-09-17 NOTE — Assessment & Plan Note (Signed)
Flu shot up to date. Pneumonia complete. Shingrix complete. Tetanus up to date. Colonoscopy aged out. Mammogram aged out, pap smear aged out and dexa complete. Counseled about sun safety and mole surveillance. Counseled about the dangers of distracted driving. Given 10 year screening recommendations.

## 2023-09-17 NOTE — Assessment & Plan Note (Signed)
 Checking lipid panel and adjust as needed.

## 2023-09-17 NOTE — Assessment & Plan Note (Signed)
 Checking lipid panel, UACR, HgA1c, CMP and adjust regimen as needed. Is on ARB and statin. Taking rybelsus .

## 2023-09-17 NOTE — Assessment & Plan Note (Signed)
 Recurrent issue and referral to ENT done to intervene. She does have long nails and we reviewed no picking or scratching in the nose to help avoid. She does have chronic cough and likely has some chronic drainage and irritation which contributes as well.

## 2023-09-17 NOTE — Assessment & Plan Note (Signed)
 Checking TSH and adjust as needed levothyroxine .

## 2023-09-21 ENCOUNTER — Ambulatory Visit: Payer: Self-pay | Admitting: Internal Medicine

## 2023-09-24 ENCOUNTER — Ambulatory Visit (INDEPENDENT_AMBULATORY_CARE_PROVIDER_SITE_OTHER): Admitting: Internal Medicine

## 2023-09-24 ENCOUNTER — Encounter: Payer: Self-pay | Admitting: Internal Medicine

## 2023-09-24 VITALS — BP 130/76 | HR 72 | Ht 63.0 in | Wt 194.6 lb

## 2023-09-24 DIAGNOSIS — E039 Hypothyroidism, unspecified: Secondary | ICD-10-CM | POA: Diagnosis not present

## 2023-09-24 MED ORDER — LEVOTHYROXINE SODIUM 88 MCG PO TABS: 88.0000 ug | ORAL_TABLET | Freq: Every day | ORAL | 3 refills | Status: DC

## 2023-09-24 NOTE — Patient Instructions (Signed)

## 2023-09-24 NOTE — Progress Notes (Signed)
 Name: Lindsay [name]  MRN/ DOB: [id number], July 24, 1945    Age/ Sex: 78 y.o., female     PCP: Rollene Almarie LABOR, MD   Reason for Endocrinology Evaluation: Hypothyroidism     Initial Endocrinology Clinic Visit: 01/11/2019    PATIENT IDENTIFIER: Lindsay [name] is a 78 y.o., female with a past medical history of HTN, T2DM and Hypothyroidism. She has followed with [facility] since 01/11/2019 for consultative assistance with management of her hypothyroidism.     HISTORICAL SUMMARY: The patient was diagnosed with hypothyroidism in 12/2018 with a TSH 25.71 uIU/mL . Was started on levothyroxine  50 mcg but developed diarrhea that she attributed to LT-4 replacement and stopped it.  She was encouraged to restart it on her initial visit to our clinic   No Fh of thyroid  disease   SUBJECTIVE:   Today (09/24/2023):  Lindsay [name] is here for a follow up on hypothyroidism.   Weight remains stable No local neck swelling  No palpitation  No tremors   Had gastritic last week with diarrhea but this has resolved   Levothyroxine  100 mcg, 1 tablet daily    HISTORY:  Past Medical History:  Past Medical History:  Diagnosis Date   Acid reflux    Anemia    Arthritis    Asthma    COPD (chronic obstructive pulmonary disease) (HCC)    Diabetes mellitus without complication (HCC)    TYPE 2    Hyperglycemia    Hypertension    Hypokalemia    Hypothyroidism    Obesity    PONV (postoperative nausea and vomiting)    Spinal stenosis    Urinary tract infection, chronic    Past Surgical History:  Past Surgical History:  Procedure Laterality Date   ABDOMINAL HYSTERECTOMY     ANKLE FUSION Left    CHOLECYSTECTOMY     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 Her smoking use  included cigarettes. She started smoking about 46 years ago. She has a 10 pack-year smoking history. She has never used smokeless tobacco. She reports that she does not drink alcohol  and does not use drugs. Family History:  Family History  Problem Relation Age of Onset   Asthma Daughter    Microcephaly Maternal Grandmother    Heart disease Maternal Grandmother        Not clear details, patient just knows she took heart pills   BRCA 1/2 Neg Hx    Breast cancer Neg Hx      HOME MEDICATIONS: Allergies as of 09/24/2023       Reactions   Codeine Nausea Only   Lisinopril Cough   Losartan     cough   Morphine And Codeine Itching   Shellfish Allergy Itching        Medication List        Accurate as of September 24, 2023 10:29 AM. If you have any questions, ask your nurse or doctor.          albuterol  (2.5 MG/3ML) 0.083% nebulizer solution Commonly known as: PROVENTIL  Take 3 mLs (2.5 mg total) by nebulization every 6 (six) hours as needed for wheezing or shortness of breath.   albuterol  108 (90 Base) MCG/ACT inhaler Commonly known as: VENTOLIN  HFA INHALE 2 PUFFS INTO THE LUNGS EVERY 6 HOURS AS  NEEDED FOR WHEEZING OR SHORTNESS OF BREATH   benzonatate  200 MG capsule Commonly known as: TESSALON  Take 200 mg by mouth 3 (three) times daily as needed for cough.   blood glucose meter kit and supplies Kit Check blood sugar with glucometer every morning before your first meal   budesonide -formoterol  160-4.5 MCG/ACT inhaler Commonly known as: Symbicort  Inhale 2 puffs into the lungs 2 (two) times daily.   cetirizine  10 MG tablet Commonly known as: ZYRTEC  Take 1 tablet (10 mg total) by mouth daily. FOR ASTHMA What changed: additional instructions   estradiol 0.1 MG/GM vaginal cream Commonly known as: ESTRACE Place vaginally.   gabapentin  300 MG capsule Commonly known as: NEURONTIN  Take 1 capsule (300 mg total) by mouth 3 (three) times daily.   Gemtesa 75 MG  Tabs Generic drug: Vibegron Take 1 tablet by mouth daily.   hydrOXYzine  25 MG capsule Commonly known as: VISTARIL  Take 1 capsule (25 mg total) by mouth 2 (two) times daily as needed.   levothyroxine  100 MCG tablet Commonly known as: SYNTHROID  Take 1 tablet (100 mcg total) by mouth daily.   losartan  50 MG tablet Commonly known as: COZAAR  TAKE 1 TABLET(50 MG) BY MOUTH DAILY   meloxicam  15 MG tablet Commonly known as: MOBIC  TAKE 1 TABLET(15 MG) BY MOUTH DAILY   montelukast  10 MG tablet Commonly known as: SINGULAIR  TAKE 1 TABLET(10 MG) BY MOUTH AT BEDTIME   NON FORMULARY DIET- REGULAR CCD   pantoprazole  40 MG tablet Commonly known as: PROTONIX  Take 1 tablet (40 mg total) by mouth daily.   Restasis 0.05 % ophthalmic emulsion Generic drug: cycloSPORINE 1 drop 2 (two) times daily.   Rybelsus  7 MG Tabs Generic drug: Semaglutide  Take 1 tablet (7 mg total) by mouth daily.   SALONPAS PAIN RELIEF PATCH EX Place 1 patch onto the skin daily as needed (pain).   simvastatin  40 MG tablet Commonly known as: ZOCOR  Take 1 tablet (40 mg total) by mouth daily at 6 PM.   tiZANidine  4 MG tablet Commonly known as: ZANAFLEX  Take 0.5-1 tablets (2-4 mg total) by mouth every 8 (eight) hours as needed for muscle spasms.   Vitamin D3 50 MCG (2000 UT) capsule Take 1 capsule (2,000 Units total) by mouth daily. Must keep scheduled follow=-up appt for future refills          OBJECTIVE:   PHYSICAL EXAM: VS: BP 130/76 (BP Location: Left Arm, Patient Position: Sitting, Cuff Size: Normal)   Pulse 72   Ht 5' 3 (1.6 m)   Wt 194 lb 9.6 oz (88.3 kg)   SpO2 99%   BMI 34.47 kg/m    EXAM: General: Pt appears well and is in NAD  Neck: General: Supple without adenopathy. Thyroid : Thyroid  size normal.  No goiter or nodules appreciated.   Lungs: Clear with good BS bilat   Heart: Auscultation: RRR.  Extremities:  BL LE: No pretibial edema normal   Mental Status: Judgment, insight:  Intact Mood and affect: No depression, anxiety, or agitation     DATA REVIEWED:   Latest Reference Range & Units 09/15/23 09:14  TSH 0.35 - 5.50 uIU/mL 0.18 (L)  (L): Data is abnormally low  Old records , labs and images have been reviewed.     ASSESSMENT / PLAN / RECOMMENDATIONS:   Hypothyroidism:   -Patient is clinically euthyroid -No local neck symptoms -She was supposed to decrease levothyroxine , but she did not receive the message, patient does not answer phone calls if the number is unrecognizable  nor does she want to set up a voicemail -Labs through PCPs office were reviewed from 09/15/2023 will continue to show partially suppressed TSH -Will recheck TFTs in 2 months and decrease levothyroxine  as below - Discussed the risk of cardiac arrhythmia with hyperthyroidism   Medications  Stop  levothyroxine  100 mcg Start Levothyroxine  88 mcg daily    F/u in 4 months   Signed electronically by: Stefano Redgie Butts, MD  [facility] Endocrinology  [facility] [address] [place] [place], [place] [address] Phone: [phone] FAX: [phone]      CC: Rollene Almarie LABOR, MD [address] [place] [address] Phone: [phone]  Fax: [phone]   Return to Endocrinology clinic as below: Future Appointments  Date Time Provider Department Center  09/24/2023 10:30 AM Bradrick Kamau, Donell Redgie, MD [facility] None  09/30/2023 10:45 AM Anice Riis, DO [facility] None  12/15/2023  9:00 AM Rollene Almarie LABOR, MD [facility]  03/16/2024  1:50 PM [facility]-ANNUAL WELLNESS VISIT [facility]

## 2023-09-30 ENCOUNTER — Institutional Professional Consult (permissible substitution) (INDEPENDENT_AMBULATORY_CARE_PROVIDER_SITE_OTHER)

## 2023-10-18 ENCOUNTER — Other Ambulatory Visit: Payer: Self-pay | Admitting: Internal Medicine

## 2023-10-19 DIAGNOSIS — J441 Chronic obstructive pulmonary disease with (acute) exacerbation: Secondary | ICD-10-CM | POA: Diagnosis not present

## 2023-10-22 ENCOUNTER — Encounter (INDEPENDENT_AMBULATORY_CARE_PROVIDER_SITE_OTHER): Payer: Self-pay

## 2023-10-22 ENCOUNTER — Ambulatory Visit (INDEPENDENT_AMBULATORY_CARE_PROVIDER_SITE_OTHER)

## 2023-10-22 VITALS — BP 157/86 | HR 76 | Temp 98.0°F | Ht 63.0 in | Wt 199.0 lb

## 2023-10-22 DIAGNOSIS — J302 Other seasonal allergic rhinitis: Secondary | ICD-10-CM

## 2023-10-22 DIAGNOSIS — R040 Epistaxis: Secondary | ICD-10-CM | POA: Diagnosis not present

## 2023-10-22 DIAGNOSIS — H608X3 Other otitis externa, bilateral: Secondary | ICD-10-CM

## 2023-10-22 MED ORDER — SALINE SPRAY 0.65 % NA SOLN
2.0000 | Freq: Three times a day (TID) | NASAL | 0 refills | Status: DC
Start: 2023-10-22 — End: 2023-11-26

## 2023-10-22 MED ORDER — MUPIROCIN 2 % EX OINT
1.0000 | TOPICAL_OINTMENT | Freq: Two times a day (BID) | CUTANEOUS | 0 refills | Status: DC
Start: 2023-10-22 — End: 2023-11-26

## 2023-10-22 MED ORDER — TRIAMCINOLONE ACETONIDE 0.1 % EX CREA: 1.0000 | TOPICAL_CREAM | Freq: Two times a day (BID) | CUTANEOUS | 0 refills | Status: AC | PRN

## 2023-10-22 NOTE — Progress Notes (Signed)
 Dear Dr. Rollene, Here is my assessment for our mutual patient, Lindsay [name]. Thank you for allowing me the opportunity to care for your patient. Please do not hesitate to contact me should you have any other questions. Sincerely, Dr. Penne Croak  Otolaryngology Clinic Note Referring provider: Dr. Rollene HPI:  Discussed the use of AI scribe software for clinical note transcription with the patient, who gave verbal consent to proceed.  History of Present Illness Lindsay [name] is a 78 year old female who presents with chronic nosebleeds and nasal congestion. She was referred by her primary care provider for evaluation of her chronic nosebleeds.  Epistaxis and nasal obstruction - Chronic epistaxis with intranasal clots for over one year, occurring daily and throughout the day - Clots are sometimes large and string-like, requiring manual removal to improve breathing - No steady flow of blood, but persistent sensation of clots within the nose - Left nostril is more congested than the right, with most clots originating from the left side - History of severe epistaxis during childhood - Uses Vaseline intranasally to alleviate soreness and dryness - Zyrtec  has been ineffective for nasal symptoms - No significant nasal congestion aside from the clots  Allergic rhinitis and asthma - Asthma and seasonal allergies - Takes Zyrtec  for allergy symptoms - No improvement in nasal symptoms with Zyrtec   Ear and throat symptoms - No tinnitus - Occasional ear itching - No throat symptoms  Other systemic symptoms - Uses a heating pad at night due to feeling cold, especially in the feet - Suspects low blood volume or anemia  Hypertension - History of hypertension, generally well-controlled - Occasional blood pressure readings described as 'teenager blood pressure'   Independent Review of Additional Tests or Records:  Reviewed external note from referring PCP, Crawford,describing  RElevant history incorporated into today's evaluation. I personally reviewed and interpreted latest lab results - mild anemia, hgb 11.   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   Procedure: TOTAL KNEE ARTHROPLASTY;  Surgeon: Yvone Rush, MD;  Location: [facility];  Service: Orthopedics;  Laterality: Right;    Family History  Problem Relation Age of Onset   Asthma Daughter    Microcephaly Maternal Grandmother    Heart disease Maternal Grandmother        Not clear details, patient just knows she took heart pills   BRCA 1/2 Neg Hx    Breast cancer Neg Hx      Social Connections: Moderately Integrated (03/16/2023)   Social Connection and Isolation Panel    Frequency of Communication with Friends and Family: More than three times a week    Frequency of Social Gatherings with Friends and Family: More than three times a week    Attends Religious Services: More than 4 times per year    Active Member of [employer]  or Organizations: Yes    Attends Banker Meetings: 1 to 4 times per year    Marital Status: Never married      Current Outpatient Medications:    mupirocin ointment (BACTROBAN) 2 %, Apply 1 Application topically 2 (two) times daily. Apply small amount inside nose left side, Disp: 22 g, Rfl: 0   sodium chloride  (OCEAN) 0.65 % SOLN nasal spray, Place 2 sprays into both nostrils 4 (four) times daily - after meals and at bedtime., Disp: 88  mL, Rfl: 0   triamcinolone  cream (KENALOG ) 0.1 %, Apply 1 Application topically 2 (two) times daily as needed. Small amount inside ear canal for ear itch, Disp: 30 g, Rfl: 0   albuterol  (PROVENTIL ) (2.5 MG/3ML) 0.083% nebulizer solution, Take 3 mLs (2.5 mg total) by nebulization every 6 (six) hours as needed for wheezing or shortness of breath., Disp: 150 mL, Rfl: 1   albuterol  (VENTOLIN  HFA) 108 (90 Base) MCG/ACT inhaler, INHALE 2 PUFFS INTO THE LUNGS EVERY 6 HOURS AS NEEDED FOR WHEEZING OR SHORTNESS OF BREATH, Disp: 6.7 g, Rfl: 2   benzonatate  (TESSALON ) 200 MG capsule, Take 200 mg by mouth 3 (three) times daily as needed for cough., Disp: , Rfl:    blood glucose meter kit and supplies KIT, Check blood sugar with glucometer every morning before your first meal, Disp: 1 each, Rfl: 0   budesonide -formoterol  (SYMBICORT ) 160-4.5 MCG/ACT inhaler, Inhale 2 puffs into the lungs 2 (two) times daily., Disp: 1 each, Rfl: 12   cetirizine  (ZYRTEC ) 10 MG tablet, Take 1 tablet (10 mg total) by mouth daily. FOR ASTHMA (Patient taking differently: Take 10 mg by mouth daily.), Disp: 30 tablet, Rfl: 5   Cholecalciferol  (VITAMIN D3) 50 MCG (2000 UT) capsule, Take 1 capsule (2,000 Units total) by mouth daily. Must keep scheduled follow=-up appt for future refills, Disp: 30 capsule, Rfl: 0   estradiol (ESTRACE) 0.1 MG/GM vaginal cream, Place vaginally., Disp: , Rfl:    gabapentin  (NEURONTIN ) 300 MG capsule, TAKE 1 CAPSULE(300 MG) BY MOUTH THREE TIMES DAILY, Disp: 90 capsule, Rfl: 6   GEMTESA 75 MG TABS, Take 1 tablet by mouth daily., Disp: , Rfl:    hydrOXYzine  (VISTARIL ) 25 MG capsule, Take 1 capsule (25 mg total) by mouth 2 (two) times daily as needed., Disp: 30 capsule, Rfl: 2   levothyroxine  (SYNTHROID ) 88 MCG tablet, Take 1 tablet (88 mcg total) by mouth daily., Disp: 90 tablet, Rfl: 3   losartan  (COZAAR ) 50 MG tablet, TAKE 1 TABLET(50 MG) BY MOUTH DAILY, Disp: 90 tablet, Rfl: 3   meloxicam  (MOBIC ) 15 MG tablet,  TAKE 1 TABLET(15 MG) BY MOUTH DAILY, Disp: 90 tablet, Rfl: 1   Menthol-Methyl Salicylate (SALONPAS PAIN RELIEF PATCH EX), Place 1 patch onto the skin daily as needed (pain)., Disp: , Rfl:    montelukast  (SINGULAIR ) 10 MG tablet, TAKE 1 TABLET(10 MG) BY MOUTH AT BEDTIME, Disp: 90 tablet, Rfl: 3   NON FORMULARY, DIET- REGULAR CCD, Disp: , Rfl:    pantoprazole  (PROTONIX ) 40 MG tablet, Take 1 tablet (40 mg total) by mouth daily., Disp: 90 tablet, Rfl: 3   RESTASIS 0.05 % ophthalmic emulsion, 1 drop 2 (two) times daily., Disp: , Rfl:    Semaglutide  (RYBELSUS ) 7 MG TABS, Take 1 tablet (7 mg total) by mouth daily., Disp: 90 tablet, Rfl: 3   simvastatin  (ZOCOR ) 40 MG tablet, Take 1 tablet (40 mg total) by mouth daily at 6 PM., Disp: 90 tablet, Rfl: 3   tiZANidine  (  ZANAFLEX ) 4 MG tablet, Take 0.5-1 tablets (2-4 mg total) by mouth every 8 (eight) hours as needed for muscle spasms., Disp: 90 tablet, Rfl: 1   Physical Exam:   BP (!) 157/86   Pulse 76   Temp 98 F (36.7 C)   Ht 5' 3 (1.6 m)   Wt 199 lb (90.3 kg)   SpO2 92%   BMI 35.25 kg/m   The patient was awake, alert, and appropriate. The external ears were inspected, and otoscopy was performed to evaluate the external auditory canals and tympanic membranes. The nasal cavity and septum were examined for mucosal changes, obstruction, or discharge. The oral cavity and oropharynx were inspected for mucosal lesions, infection, or tonsillar hypertrophy. The neck was palpated for lymphadenopathy, thyroid  abnormalities, or other masses. Cranial nerve function was grossly intact.  Pertinent Findings: Physical Exam HEENT: Bleeding spot on left inferior turbinate. Mild eczema in ears. No cerumen in right ear. Throat normal. NECK: Neck supple, no tenderness.   Seprately Identifiable Procedures:  I personally ordered, reviewed and interpreted the following with the patient today  Given the patient's symptoms and incomplete visualization of critical  sinonasal areas with anterior rhinoscopy, a separately performed diagnostic nasal endoscopy procedure is indicated for a complete rhinologic evaluation per American Rhinologic Society recommendations (https://www.american-rhinologic.org/position-statements)  I personally ordered, reviewed and interpreted the following with the patient today  Procedure Note Diagnostic Nasal Endoscopy CPT CODE -- 68768 - Mod 25  Prior to initiating any procedures, risks/benefits/alternatives were explained to the patient and verbal consent obtained.  Pre-procedure diagnosis: Concern for obstruction and mass Post-procedure diagnosis: same Indication: See pre-procedure diagnosis and physical exam above Complications: None apparent EBL: 1cc mL Anesthesia: Lidocaine  4% and topical decongestant was topically sprayed in each nasal cavity  Description of Procedure:  Patient was identified. A flexible fiberoptic endoscope was utilized to evaluate the sinonasal cavities, mucosa, sinus ostia and turbinates and septum.  Overall, signs of mucosal inflammation are presesnt noted.  Also noted are bleeding from left inferior turbinate and mild diffuse congestion.  No mucopurulence, polyps, or masses noted.   Right Middle meatus: clear Right SE Recess: clear Left MM: clear Left SE Recess: clear Photodocumentation was obtained.    Procedure: Anterior nasal packing, right side (69098) - 59. Indication: Recurrent left-sided epistaxis. After informed consent, right nasal cavity was anesthetized with topical lidocaine  and packed with a piece of gelfoam with mupirocin placed to achieve hemostasis. Patient tolerated procedure well.  Impression & Plans:  Lindsay [name] is a 78 y.o. female  1. Chronic eczematous otitis externa of both ears   2. Epistaxis   3. Seasonal allergies     - Findings and diagnoses discussed in detail with the patient. - Risks, benefits, and alternatives were reviewed. Through shared decision  making, the patient elects to proceed with below. Assessment & Plan Recurrent left-sided epistaxis Chronic left-sided epistaxis with clot formation. Source identified on left inferior turbinate. Possible contributing factors include nasal dryness and frequent nose blowing. Discussed cauterization; patient opted for medical management. - Prescribe antibiotic ointment for intranasal application every morning and night. - Advise against frequent nose blowing to allow healing. - Prescribe nasal spray to be used three times a day with meals. - Place dissolvable nasal packing to absorb blood and prevent further bleeding. - Schedule follow-up appointment in 3-4 weeks to reassess the condition.  Eczema of external ear canal Mild eczema in the external ear canal causing occasional itching. - Prescribe topical cream for application in the ear  canal as needed for itching.  - Orders placed: No orders of the defined types were placed in this encounter.  - Medications prescribed/continued/adjusted:  Meds ordered this encounter  Medications   sodium chloride  (OCEAN) 0.65 % SOLN nasal spray    Sig: Place 2 sprays into both nostrils 4 (four) times daily - after meals and at bedtime.    Dispense:  88 mL    Refill:  0   mupirocin ointment (BACTROBAN) 2 %    Sig: Apply 1 Application topically 2 (two) times daily. Apply small amount inside nose left side    Dispense:  22 g    Refill:  0   triamcinolone  cream (KENALOG ) 0.1 %    Sig: Apply 1 Application topically 2 (two) times daily as needed. Small amount inside ear canal for ear itch    Dispense:  30 g    Refill:  0   - Education materials provided to the patient. - Follow up: 1 month. Patient instructed to return sooner or go to the ED if new/worsening symptoms develop.  Thank you for allowing me the opportunity to care for your patient. Please do not hesitate to contact me should you have any other questions.  Sincerely, Penne Croak,  DO Otolaryngologist (ENT) [facility] Phone: [phone] Fax: [phone]  10/22/2023, 9:43 AM

## 2023-10-23 ENCOUNTER — Telehealth (INDEPENDENT_AMBULATORY_CARE_PROVIDER_SITE_OTHER): Payer: Self-pay

## 2023-10-23 NOTE — Telephone Encounter (Signed)
 The patient called in reporting the last part of the packing fell out this am, she wanted to make sure this was normal timing. Please advise.

## 2023-11-07 ENCOUNTER — Other Ambulatory Visit: Payer: Self-pay | Admitting: Internal Medicine

## 2023-11-07 DIAGNOSIS — E785 Hyperlipidemia, unspecified: Secondary | ICD-10-CM

## 2023-11-09 ENCOUNTER — Telehealth (INDEPENDENT_AMBULATORY_CARE_PROVIDER_SITE_OTHER): Payer: Self-pay

## 2023-11-09 NOTE — Telephone Encounter (Signed)
 Pt called and LVM needing to reschedule her Appt on the 18 with dr.knight

## 2023-11-13 ENCOUNTER — Other Ambulatory Visit: Payer: Self-pay | Admitting: Internal Medicine

## 2023-11-13 DIAGNOSIS — Z1231 Encounter for screening mammogram for malignant neoplasm of breast: Secondary | ICD-10-CM

## 2023-11-17 ENCOUNTER — Ambulatory Visit: Payer: Self-pay | Admitting: Pulmonary Disease

## 2023-11-17 NOTE — Telephone Encounter (Signed)
 FYI Only or Action Required?: FYI only for provider: appointment scheduled on 11.23.26.  Patient is followed in Pulmonology for asthma, last seen on 07/29/2023 by Hunsucker, [name], MD.  Called Nurse Triage reporting Wheezing.  Symptoms began n/a.  Interventions attempted: Other: n/a.  Symptoms are: n/a.  Triage Disposition: See PCP Within 2 Weeks  Patient/caregiver understands and will follow disposition?: Yes     Copied from CRM [id number]. Topic: Clinical - Red Word Triage >> Nov 17, 2023  9:03 AM Celestine FALCON wrote: Red Word that prompted transfer to Nurse Triage: Pt is having off and on wheezing pt believes it is due to weather. Pt is trying to set up 6 month follow up with Dr. [name] in [place] for 01/29/23 or later. Reason for Disposition  Requesting regular office appointment  Answer Assessment - Initial Assessment Questions Pt called for a follow up appt. Pt mentioned wheezing to PAS. Pt states wheezing no worse than normal. She states it is always on and off. Denies any shortness of breath, difficulty breathing or other symptoms. Follow up appt scheduled.      1. RESPIRATORY STATUS: Describe your breathing? (e.g., wheezing, shortness of breath, unable to speak, severe coughing)      No change 2. ONSET: When did this breathing problem begin?      No change 3. PATTERN Does the difficult breathing come and go, or has it been constant since it started?      No change 4. SEVERITY: How bad is your breathing? (e.g., mild, moderate, severe)      none  9. OTHER SYMPTOMS: Do you have any other symptoms? (e.g., chest pain, cough, dizziness, fever, runny nose)     denies  Answer Assessment - Initial Assessment Questions 1. REASON FOR CALL: What is the main reason for your call? or How can I best help you?     PT called for follow up appt. Mentioned to PAS wheezing on an off. RN triaged patient. No wheezing currently. It's no worse than normal. She states she  gets wheezing on an off all the time but especially this time of year. Denies any shortness of breath or difficulty breathing. Denies any symptoms. Rn advised pt to call if this changes at all and we can get her in sooner. Scheduled her for 6 month follow up with Dr. [name] 2. SYMPTOMS : Do you have any symptoms?      No, pt denies any symptoms 3. OTHER QUESTIONS: Do you have any other questions?     no  Protocols used: Breathing Difficulty-A-AH, Information Only Call - No Triage-A-AH

## 2023-11-24 ENCOUNTER — Ambulatory Visit (INDEPENDENT_AMBULATORY_CARE_PROVIDER_SITE_OTHER)

## 2023-11-24 ENCOUNTER — Other Ambulatory Visit

## 2023-11-25 ENCOUNTER — Other Ambulatory Visit

## 2023-11-25 LAB — T4, FREE: Free T4: 1.4 ng/dL (ref 0.8–1.8)

## 2023-11-25 LAB — TSH: TSH: 0.47 m[IU]/L (ref 0.40–4.50)

## 2023-11-26 ENCOUNTER — Encounter (INDEPENDENT_AMBULATORY_CARE_PROVIDER_SITE_OTHER): Payer: Self-pay

## 2023-11-26 ENCOUNTER — Ambulatory Visit (INDEPENDENT_AMBULATORY_CARE_PROVIDER_SITE_OTHER)

## 2023-11-26 ENCOUNTER — Ambulatory Visit: Payer: Self-pay | Admitting: Internal Medicine

## 2023-11-26 VITALS — BP 122/82 | HR 79 | Temp 98.1°F

## 2023-11-26 DIAGNOSIS — H608X3 Other otitis externa, bilateral: Secondary | ICD-10-CM | POA: Diagnosis not present

## 2023-11-26 DIAGNOSIS — J3489 Other specified disorders of nose and nasal sinuses: Secondary | ICD-10-CM | POA: Diagnosis not present

## 2023-11-26 DIAGNOSIS — J342 Deviated nasal septum: Secondary | ICD-10-CM | POA: Diagnosis not present

## 2023-11-26 DIAGNOSIS — J302 Other seasonal allergic rhinitis: Secondary | ICD-10-CM

## 2023-11-26 DIAGNOSIS — R040 Epistaxis: Secondary | ICD-10-CM | POA: Diagnosis not present

## 2023-11-26 MED ORDER — SALINE SPRAY 0.65 % NA SOLN: 2.0000 | Freq: Four times a day (QID) | NASAL | 12 refills | Status: AC

## 2023-11-26 MED ORDER — MUPIROCIN 2 % EX OINT: 1.0000 | TOPICAL_OINTMENT | Freq: Two times a day (BID) | CUTANEOUS | 1 refills | Status: AC

## 2023-11-26 NOTE — Progress Notes (Signed)
 Dear Dr. Rollene, Here is my assessment for our mutual patient, Lindsay [name]. Thank you for allowing me the opportunity to care for your patient. Please do not hesitate to contact me should you have any other questions. Sincerely, Dr. Penne Croak  Otolaryngology Clinic Note Referring provider: Dr. Rollene HPI:  Discussed the use of AI scribe software for clinical note transcription with the patient, who gave verbal consent to proceed.  History of Present Illness Lindsay [name] is a 78 year old female with asthma who presents with nasal bleeding and clots.  Epistaxis - Ongoing nasal bleeding, primarily on the left side - Bleeding is less severe than previous episodes - Clots are present but have reduced in size - Persistent bleeding despite use of nasal spray and cream - Improvement in symptoms compared to prior episodes - Not using a humidifier at night due to cost concerns and uncertainty about Medicaid coverage  Asthma and respiratory symptoms - History of asthma - Uses nebulizer and inhalers, including Symbicort  and another unspecified inhaler - Takes Singulair  for asthma management - Occasionally takes Zyrtec , uncertain if it is necessary for asthma - Unable to use Vicks products due to asthma exacerbation  Balance disturbance - Experiences balance issues - Daughter attributes balance issues to gabapentin  use  Neuropathic leg pain - Uses gabapentin  for nerve pain in the leg - Nerve pain recurs frequently  PMH/Meds/All/SocHx/FamHx/ROS:   Past Medical History:  Diagnosis Date   Acid reflux    Anemia    Arthritis    Asthma    COPD (chronic obstructive pulmonary disease) (HCC)    Diabetes mellitus without complication (HCC)    TYPE 2    Hyperglycemia    Hypertension    Hypokalemia    Hypothyroidism    Obesity    PONV (postoperative nausea and vomiting)    Spinal stenosis    Urinary tract infection, chronic      Past Surgical History:  Procedure  Laterality Date   ABDOMINAL HYSTERECTOMY     ANKLE FUSION Left    CHOLECYSTECTOMY     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   Procedure: TOTAL KNEE ARTHROPLASTY;  Surgeon: Yvone Rush, MD;  Location: [facility];  Service: Orthopedics;  Laterality: Right;    Family History  Problem Relation Age of Onset   Asthma Daughter    Microcephaly Maternal Grandmother    Heart disease Maternal Grandmother        Not clear details, patient just knows she took heart pills   BRCA 1/2 Neg Hx    Breast cancer Neg Hx      Social Connections: Moderately Integrated (03/16/2023)   Social Connection and Isolation Panel    Frequency of Communication with Friends and Family: More than three times a week    Frequency of Social Gatherings with Friends and Family: More than three times a week    Attends Religious Services: More than 4 times per year    Active Member of [employer] or Organizations: Yes    Attends Banker Meetings: 1 to 4 times per year    Marital Status: Never married      Current Outpatient Medications:    albuterol  (PROVENTIL ) (2.5 MG/3ML) 0.083% nebulizer solution, Take 3 mLs (2.5 mg total) by nebulization every 6 (six) hours as needed for wheezing or shortness of breath., Disp: 150 mL, Rfl: 1   albuterol  (VENTOLIN  HFA) 108 (90 Base) MCG/ACT inhaler, INHALE 2 PUFFS INTO THE LUNGS EVERY 6 HOURS AS NEEDED FOR WHEEZING  OR SHORTNESS OF BREATH, Disp: 6.7 g, Rfl: 2   benzonatate  (TESSALON ) 200 MG capsule, Take 200 mg by mouth 3 (three) times daily as needed for cough., Disp: , Rfl:    blood glucose meter kit and supplies KIT, Check blood sugar with glucometer every morning before your first meal, Disp: 1 each, Rfl: 0   budesonide -formoterol  (SYMBICORT ) 160-4.5 MCG/ACT inhaler, Inhale 2 puffs into the lungs 2 (two) times daily., Disp: 1 each, Rfl: 12   Cholecalciferol  (VITAMIN D3) 50 MCG (2000 UT) capsule, Take 1 capsule (2,000 Units total) by mouth daily. Must keep scheduled follow=-up  appt for future refills, Disp: 30 capsule, Rfl: 0   estradiol (ESTRACE) 0.1 MG/GM vaginal cream, Place vaginally., Disp: , Rfl:    gabapentin  (NEURONTIN ) 300 MG capsule, TAKE 1 CAPSULE(300 MG) BY MOUTH THREE TIMES DAILY, Disp: 90 capsule, Rfl: 6   GEMTESA 75 MG TABS, Take 1 tablet by mouth daily., Disp: , Rfl:    hydrOXYzine  (VISTARIL ) 25 MG capsule, Take 1 capsule (25 mg total) by mouth 2 (two) times daily as needed., Disp: 30 capsule, Rfl: 2   levothyroxine  (SYNTHROID ) 88 MCG tablet, Take 1 tablet (88 mcg total) by mouth daily., Disp: 90 tablet, Rfl: 3   losartan  (COZAAR ) 50 MG tablet, TAKE 1 TABLET(50 MG) BY MOUTH DAILY, Disp: 90 tablet, Rfl: 3   meloxicam  (MOBIC ) 15 MG tablet, TAKE 1 TABLET(15 MG) BY MOUTH DAILY, Disp: 90 tablet, Rfl: 1   Menthol-Methyl Salicylate (SALONPAS PAIN RELIEF PATCH EX), Place 1 patch onto the skin daily as needed (pain)., Disp: , Rfl:    montelukast  (SINGULAIR ) 10 MG tablet, TAKE 1 TABLET(10 MG) BY MOUTH AT BEDTIME, Disp: 90 tablet, Rfl: 3   mupirocin  ointment (BACTROBAN ) 2 %, Apply 1 Application topically 2 (two) times daily. Apply small amount inside nose left side, Disp: 22 g, Rfl: 1   NON FORMULARY, DIET- REGULAR CCD, Disp: , Rfl:    pantoprazole  (PROTONIX ) 40 MG tablet, Take 1 tablet (40 mg total) by mouth daily., Disp: 90 tablet, Rfl: 3   RESTASIS 0.05 % ophthalmic emulsion, 1 drop 2 (two) times daily., Disp: , Rfl:    Semaglutide  (RYBELSUS ) 7 MG TABS, Take 1 tablet (7 mg total) by mouth daily., Disp: 90 tablet, Rfl: 3   simvastatin  (ZOCOR ) 40 MG tablet, Take 1 tablet (40 mg total) by mouth daily at 6 PM., Disp: 90 tablet, Rfl: 3   sodium chloride  (OCEAN) 0.65 % SOLN nasal spray, Place 2 sprays into both nostrils in the morning, at noon, in the evening, and at bedtime., Disp: 88 mL, Rfl: 12   tiZANidine  (ZANAFLEX ) 4 MG tablet, Take 0.5-1 tablets (2-4 mg total) by mouth every 8 (eight) hours as needed for muscle spasms., Disp: 90 tablet, Rfl: 1   triamcinolone   cream (KENALOG ) 0.1 %, Apply 1 Application topically 2 (two) times daily as needed. Small amount inside ear canal for ear itch, Disp: 30 g, Rfl: 0   Physical Exam:   BP 122/82   Pulse 79   Temp 98.1 F (36.7 C)   SpO2 96%   The patient was awake, alert, and appropriate. The external ears were inspected, and otoscopy was performed to evaluate the external auditory canals and tympanic membranes. The nasal cavity and septum were examined for mucosal changes, obstruction, or discharge. The oral cavity and oropharynx were inspected for mucosal lesions, infection, or tonsillar hypertrophy. The neck was palpated for lymphadenopathy, thyroid  abnormalities, or other masses. Cranial nerve function was grossly intact.  Pertinent Findings: Physical Exam HEENT: Nasal mucosa with a small spot on the left side, improved appearance. Nasal septum deviated. Eczema in the ear, left side worse.  Seprately Identifiable Procedures:  I personally ordered, reviewed and interpreted the following with the patient today  Given the patient's symptoms and incomplete visualization of critical sinonasal areas with anterior rhinoscopy, a separately performed diagnostic nasal endoscopy procedure is indicated for a complete rhinologic evaluation per American Rhinologic Society recommendations ([email or web address]://[email or web address]-rhinologic.org/position-statements)  I personally ordered, reviewed and interpreted the following with the patient today  Procedure Note Diagnostic Nasal Endoscopy CPT CODE -- 68768 - Mod 25  Prior to initiating any procedures, risks/benefits/alternatives were explained to the patient and verbal consent obtained.  Pre-procedure diagnosis: Concern for posterior bleed and mass Post-procedure diagnosis: same Indication: See pre-procedure diagnosis and physical exam above Complications: None apparent EBL: 0 mL Anesthesia: Lidocaine  4% and topical decongestant was topically sprayed in each nasal  cavity  Description of Procedure:  Patient was identified. A flexible fiberoptic endoscope was utilized to evaluate the sinonasal cavities, mucosa, sinus ostia and turbinates and septum.  Overall, signs of mucosal inflammation are noted.  Also noted are decreased eschar.  No mucopurulence, polyps, or masses noted.   Right Middle meatus: clear Right SE Recess: clear Left MM: cclear Left SE Recess: clear Photodocumentation was obtained.   Impression & Plans:  Lindsay [name] is a 78 y.o. female  1. Chronic eczematous otitis externa of both ears   2. Epistaxis   3. Recurrent epistaxis   4. Seasonal allergies     - Findings and diagnoses discussed in detail with the patient. - Risks, benefits, and alternatives were reviewed. Through shared decision making, the patient elects to proceed with below. Assessment & Plan Chronic epistaxis and nasal obstruction Chronic epistaxis improved with nasal spray and cream. No tumor. Deviated septum and dry air may contribute. Zyrtec  may worsen dryness. No mass on nasal endoscopy.  - Continue nasal spray twice daily and once at bedtime. - Continue cream application twice daily after meals. - Discontinued Zyrtec  to assess impact on nasal dryness. - Consider using a humidifier at night. - Reassess in one month. - Declined cauterization  Chronic eczematous otitis externa Chronic eczematous otitis externa improved with topical treatment. - Continue current topical treatment regimen.  - Orders placed: No orders of the defined types were placed in this encounter.  - Medications prescribed/continued/adjusted:  Meds ordered this encounter  Medications   sodium chloride  (OCEAN) 0.65 % SOLN nasal spray    Sig: Place 2 sprays into both nostrils in the morning, at noon, in the evening, and at bedtime.    Dispense:  88 mL    Refill:  12   mupirocin  ointment (BACTROBAN ) 2 %    Sig: Apply 1 Application topically 2 (two) times daily. Apply small amount inside  nose left side    Dispense:  22 g    Refill:  1   - Education materials provided to the patient. - Follow up: 8 weeks. Patient instructed to return sooner or go to the ED if new/worsening symptoms develop.   Thank you for allowing me the opportunity to care for your patient. Please do not hesitate to contact me should you have any other questions.  Sincerely, Penne Croak,  DO                                                                                                                                                                                                                                                                                                                                                                                                                                                                                                                                                                                                                                                                                                                                                                                                                                                                                                                                                                                                                                                                                                                                                                                                                                                                                                                                                                                                                                                                                                                                                                       (  ENT) [facility] Phone: [phone] Fax: [phone]  12/03/2023, 9:56 AM

## 2023-12-15 ENCOUNTER — Ambulatory Visit: Admitting: Internal Medicine

## 2023-12-21 ENCOUNTER — Encounter: Payer: Self-pay | Admitting: Internal Medicine

## 2023-12-21 ENCOUNTER — Ambulatory Visit (INDEPENDENT_AMBULATORY_CARE_PROVIDER_SITE_OTHER): Admitting: Internal Medicine

## 2023-12-21 VITALS — BP 130/72 | HR 71 | Temp 98.2°F | Ht 63.0 in | Wt 191.8 lb

## 2023-12-21 DIAGNOSIS — E118 Type 2 diabetes mellitus with unspecified complications: Secondary | ICD-10-CM

## 2023-12-21 DIAGNOSIS — R040 Epistaxis: Secondary | ICD-10-CM

## 2023-12-21 DIAGNOSIS — R0789 Other chest pain: Secondary | ICD-10-CM

## 2023-12-21 MED ORDER — KETOROLAC TROMETHAMINE 30 MG/ML IJ SOLN
30.0000 mg | Freq: Once | INTRAMUSCULAR | Status: AC
  Administered 2023-12-21: 11:00:00 30 mg via INTRAMUSCULAR

## 2023-12-21 NOTE — Assessment & Plan Note (Signed)
 She experiences acute severe chest wall pain, likely musculoskeletal, with a normal EKG. The differential includes muscle strain or contusion. Administered Toradol  30 mg IM for pain relief. Advised to monitor symptoms and return if pain persists or worsens. Consider chest x-ray if symptoms do not improve in a few days.

## 2023-12-21 NOTE — Progress Notes (Signed)
 Subjective:   Patient ID: Lindsay [name], female    DOB: 06-Sep-1945, 78 y.o.   MRN: [id number]  Discussed the use of AI scribe software for clinical note transcription with the patient, who gave verbal consent to proceed.  History of Present Illness Lindsay [name] is a 78 year old female who presents with chest and rib pain.  She experiences severe pain in her chest and rib area, which started suddenly this morning upon waking. The pain is described as 'pressurey or tight or heavy' and worsens with movement, deep breaths, and twisting. No recent injury or unusual activity is noted, although she did fall on her knee during Thanksgiving, which remains sore.  She has a history of asthma but states that the current pain does not feel like her usual asthma symptoms. No breathing difficulties, heart racing, or cough are associated with the pain. She has not used her albuterol  inhaler today as the pain does not seem related to her asthma.  She mentions a chronic issue with nasal clots and epistaxis, for which she is using a nasal spray and cream prescribed by an ENT specialist. There is some improvement in the frequency and severity of the clots.  Her A1c levels were last recorded at 6.2. She mentions a decreased appetite today, having not eaten due to the pain, but plans to eat once she returns home.  Review of Systems  Constitutional: Negative.   HENT:  Positive for nosebleeds.   Eyes: Negative.   Respiratory:  Positive for chest tightness. Negative for cough and shortness of breath.   Cardiovascular:  Positive for chest pain. Negative for palpitations and leg swelling.  Gastrointestinal:  Negative for abdominal distention, abdominal pain, constipation, diarrhea, nausea and vomiting.  Musculoskeletal:  Positive for myalgias.  Skin: Negative.   Neurological: Negative.   Psychiatric/Behavioral: Negative.      Objective:  Physical Exam Constitutional:      Appearance: She is  well-developed.  HENT:     Head: Normocephalic and atraumatic.  Cardiovascular:     Rate and Rhythm: Normal rate and regular rhythm.  Pulmonary:     Effort: Pulmonary effort is normal. No respiratory distress.     Breath sounds: Normal breath sounds. No wheezing or rales.     Comments: Appear uncomfortable with moving or twisting some soreness around rib cage with palpation Chest:     Chest wall: Tenderness present.  Abdominal:     General: Bowel sounds are normal. There is no distension.     Palpations: Abdomen is soft.     Tenderness: There is no abdominal tenderness.  Musculoskeletal:     Cervical back: Normal range of motion.  Skin:    General: Skin is warm and dry.  Neurological:     Mental Status: She is alert and oriented to person, place, and time.     Coordination: Coordination normal.    EKG: Rate 66, axis normal, interval normal, sinus, no st or t wave changes, no significant change compared to prior 2024   Vitals:   12/21/23 0932  BP: 130/72  Pulse: 71  Temp: 98.2 F (36.8 C)  TempSrc: Oral  SpO2: 94%  Weight: 191 lb 12.8 oz (87 kg)  Height: 5' 3 (1.6 m)   Toradol  30 mg IM given at visit  Assessment and Plan Assessment & Plan Atypical chest pain  She experiences acute severe chest wall pain, likely musculoskeletal, with a normal EKG. The differential includes muscle strain or contusion. Administered Toradol   30 mg IM for pain relief. Advised to monitor symptoms and return if pain persists or worsens. Consider chest x-ray if symptoms do not improve in a few days.  Type 2 diabetes mellitus   Her diabetes is well-controlled with an A1c of 6.2. Encouraged to maintain current diabetes management plan.  Chronic epistaxis   There is recent improvement in frequency and severity. Current management includes nasal spray and cream. Continue current nasal spray and cream as prescribed by ENT. Follow up with ENT in January.

## 2023-12-21 NOTE — Patient Instructions (Signed)
 The EKG is normal today.  We have given you a toradol  shot today which should help with the pain.  Let us  know if this is getting better or not.

## 2023-12-22 ENCOUNTER — Ambulatory Visit: Admitting: Internal Medicine

## 2023-12-22 ENCOUNTER — Ambulatory Visit
Admission: RE | Admit: 2023-12-22 | Discharge: 2023-12-22 | Disposition: A | Source: Ambulatory Visit | Attending: Internal Medicine | Admitting: Internal Medicine

## 2023-12-22 DIAGNOSIS — Z1231 Encounter for screening mammogram for malignant neoplasm of breast: Secondary | ICD-10-CM

## 2023-12-23 ENCOUNTER — Telehealth: Payer: Self-pay

## 2023-12-23 NOTE — Telephone Encounter (Signed)
 Copied from CRM #[id number]. Topic: Clinical - Medical Advice >> Dec 23, 2023  8:57 AM Mesmerise C wrote: Reason for CRM: Patient was supposed to call in and let Dr. Rollene if the shot she got on 12/15 helped with the pain, stated it did help a lot and wanted to make provider aware

## 2023-12-25 ENCOUNTER — Ambulatory Visit: Payer: Self-pay | Admitting: Internal Medicine

## 2024-01-05 ENCOUNTER — Other Ambulatory Visit: Payer: Self-pay | Admitting: Internal Medicine

## 2024-01-12 ENCOUNTER — Encounter (INDEPENDENT_AMBULATORY_CARE_PROVIDER_SITE_OTHER): Payer: Self-pay

## 2024-01-12 ENCOUNTER — Ambulatory Visit (INDEPENDENT_AMBULATORY_CARE_PROVIDER_SITE_OTHER)

## 2024-01-12 VITALS — BP 129/82 | HR 77 | Wt 187.0 lb

## 2024-01-12 DIAGNOSIS — H608X3 Other otitis externa, bilateral: Secondary | ICD-10-CM | POA: Diagnosis not present

## 2024-01-12 DIAGNOSIS — H9190 Unspecified hearing loss, unspecified ear: Secondary | ICD-10-CM

## 2024-01-12 DIAGNOSIS — J302 Other seasonal allergic rhinitis: Secondary | ICD-10-CM | POA: Diagnosis not present

## 2024-01-12 DIAGNOSIS — R040 Epistaxis: Secondary | ICD-10-CM

## 2024-01-12 NOTE — Patient Instructions (Signed)
                         Contains text generated by [employer]bridge.                                 Contains text generated by [employer]bridge.

## 2024-01-12 NOTE — Progress Notes (Signed)
 Dear Dr. Rollene, Here is my assessment for our mutual patient, Lindsay [name]. Thank you for allowing me the opportunity to care for your patient. Please do not hesitate to contact me should you have any other questions. Sincerely, Dr. Penne Croak  Otolaryngology Clinic Note Referring provider: Dr. Rollene HPI:  Discussed the use of AI scribe software for clinical note transcription with the patient, who gave verbal consent to proceed.  History of Present Illness Lindsay [name] is a 79 year old female with asthma who presents with nasal bleeding and clots.  Epistaxis - Ongoing nasal bleeding, primarily on the left side - Bleeding is less severe than previous episodes - Clots are present but have reduced in size - Persistent bleeding despite use of nasal spray and cream - Improvement in symptoms compared to prior episodes - Not using a humidifier at night due to cost concerns and uncertainty about Medicaid coverage  Asthma and respiratory symptoms - History of asthma - Uses nebulizer and inhalers, including Symbicort  and another unspecified inhaler - Takes Singulair  for asthma management - Occasionally takes Zyrtec , uncertain if it is necessary for asthma - Unable to use Vicks products due to asthma exacerbation  Balance disturbance - Experiences balance issues - Daughter attributes balance issues to gabapentin  use  Neuropathic leg pain - Uses gabapentin  for nerve pain in the leg - Nerve pain recurs frequently   Lindsay [name] is a 79 year old female with recurrent left-sided epistaxis who presents for evaluation of ongoing nasal bleeding.  Epistaxis - Longstanding recurrent episodes localized to the left nasal cavity - Recent decrease in frequency and severity of bleeding - Current episodes characterized by smaller clots and less overall bleeding - Most recent clot removal occurred yesterday - Occasional small clots present when blowing nose - No continuous  nasal discharge or large-volume bleeding - Continues to use nasal spray and ointment, but not as consistently as prescribed due to polypharmacy - No recent significant nasal drainage  Chronic eczematous otitis externa - History of ear itching and eczema affecting both ears - Improvement in symptoms with prescribed topical cream, but not complete resolution - Persistent mild pruritus, particularly in the right ear - Left ear rarely itches  Hearing difficulty - Occasional difficulty hearing, requiring others to repeat herself - No acute changes in hearing - No recent audiologic evaluation - Interest in obtaining a baseline hearing assessment  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   Procedure: TOTAL KNEE ARTHROPLASTY;  Surgeon: Yvone Rush, MD;  Location: [facility];  Service: Orthopedics;  Laterality: Right;    Family History  Problem Relation Age of Onset   Asthma Daughter    Microcephaly Maternal Grandmother    Heart disease Maternal Grandmother        Not clear details, patient just knows she took heart  pills   BRCA 1/2 Neg Hx    Breast cancer Neg Hx      Social Connections: Moderately Integrated (03/16/2023)   Social Connection and Isolation Panel    Frequency of Communication with Friends and Family: More than three times a week    Frequency of Social Gatherings with Friends and Family: More than three times a week    Attends  Religious Services: More than 4 times per year    Active Member of [employer] or Organizations: Yes    Attends Banker Meetings: 1 to 4 times per year    Marital Status: Never married      Current Outpatient Medications:    albuterol  (PROVENTIL ) (2.5 MG/3ML) 0.083% nebulizer solution, Take 3 mLs (2.5 mg total) by nebulization every 6 (six) hours as needed for wheezing or shortness of breath., Disp: 150 mL, Rfl: 1   albuterol  (VENTOLIN  HFA) 108 (90 Base) MCG/ACT inhaler, INHALE 2 PUFFS INTO THE LUNGS EVERY 6 HOURS AS NEEDED FOR WHEEZING OR SHORTNESS OF BREATH, Disp: 6.7 g, Rfl: 2   benzonatate  (TESSALON ) 200 MG capsule, Take 200 mg by mouth 3 (three) times daily as needed for cough., Disp: , Rfl:    blood glucose meter kit and supplies KIT, Check blood sugar with glucometer every morning before your first meal, Disp: 1 each, Rfl: 0   budesonide -formoterol  (SYMBICORT ) 160-4.5 MCG/ACT inhaler, Inhale 2 puffs into the lungs 2 (two) times daily., Disp: 1 each, Rfl: 12   Cholecalciferol  (VITAMIN D3) 50 MCG (2000 UT) capsule, Take 1 capsule (2,000 Units total) by mouth daily. Must keep scheduled follow=-up appt for future refills, Disp: 30 capsule, Rfl: 0   estradiol (ESTRACE) 0.1 MG/GM vaginal cream, Place vaginally., Disp: , Rfl:    gabapentin  (NEURONTIN ) 300 MG capsule, TAKE 1 CAPSULE(300 MG) BY MOUTH THREE TIMES DAILY, Disp: 90 capsule, Rfl: 6   GEMTESA 75 MG TABS, Take 1 tablet by mouth daily., Disp: , Rfl:    hydrOXYzine  (VISTARIL ) 25 MG capsule, Take 1 capsule (25 mg total) by mouth 2 (two) times daily as needed., Disp: 30 capsule, Rfl: 2   levothyroxine  (SYNTHROID ) 88 MCG tablet, Take 1 tablet (88 mcg total) by mouth daily., Disp: 90 tablet, Rfl: 3   losartan  (COZAAR ) 50 MG tablet, TAKE 1 TABLET(50 MG) BY MOUTH DAILY, Disp: 90 tablet, Rfl: 3   meloxicam  (MOBIC ) 15 MG tablet, TAKE 1 TABLET(15 MG) BY MOUTH DAILY, Disp: 90 tablet, Rfl: 1   Menthol-Methyl Salicylate (SALONPAS PAIN RELIEF  PATCH EX), Place 1 patch onto the skin daily as needed (pain)., Disp: , Rfl:    montelukast  (SINGULAIR ) 10 MG tablet, TAKE 1 TABLET(10 MG) BY MOUTH AT BEDTIME, Disp: 90 tablet, Rfl: 3   mupirocin  ointment (BACTROBAN ) 2 %, Apply 1 Application topically 2 (two) times daily. Apply small amount inside nose left side, Disp: 22 g, Rfl: 1   NON FORMULARY, DIET- REGULAR CCD, Disp: , Rfl:    pantoprazole  (PROTONIX ) 40 MG tablet, Take 1 tablet (40 mg total) by mouth daily., Disp: 90 tablet, Rfl: 3   RESTASIS 0.05 % ophthalmic emulsion, 1 drop 2 (two) times daily., Disp: , Rfl:    Semaglutide  (RYBELSUS ) 7 MG TABS, Take 1 tablet (7 mg total) by mouth daily., Disp: 90 tablet, Rfl: 3   simvastatin  (ZOCOR ) 40 MG tablet, Take 1 tablet (40 mg total) by mouth daily at 6 PM., Disp: 90 tablet, Rfl: 3   sodium chloride  (OCEAN) 0.65 % SOLN nasal spray, Place 2 sprays  into both nostrils in the morning, at noon, in the evening, and at bedtime., Disp: 88 mL, Rfl: 12   tiZANidine  (ZANAFLEX ) 4 MG tablet, TAKE 1/2 TO 1 TABLET(2 TO 4 MG) BY MOUTH EVERY 8 HOURS AS NEEDED FOR MUSCLE SPASMS, Disp: 90 tablet, Rfl: 1   triamcinolone  cream (KENALOG ) 0.1 %, Apply 1 Application topically 2 (two) times daily as needed. Small amount inside ear canal for ear itch, Disp: 30 g, Rfl: 0   Physical Exam:   BP 129/82 (BP Location: Left Arm, Patient Position: Sitting, Cuff Size: Normal)   Pulse 77   Wt 187 lb (84.8 kg)   SpO2 97%   BMI 33.13 kg/m   The patient was awake, alert, and appropriate. The external ears were inspected, and otoscopy was performed to evaluate the external auditory canals and tympanic membranes. The nasal cavity and septum were examined for mucosal changes, obstruction, or discharge. The oral cavity and oropharynx were inspected for mucosal lesions, infection, or tonsillar hypertrophy. The neck was palpated for lymphadenopathy, thyroid  abnormalities, or other masses. Cranial nerve function was grossly  intact.  Pertinent Findings: Physical Exam HEENT: Nasal mucosa with a small spot on the superior left septum, improved appearance. Nasal septum deviated. Eczema in the ear, left side worse.   HEENT: Right tonsil slightly enlarged. Nasal mucosa with small bleeding spot left anterior and superior septum.   Seprately Identifiable Procedures:  I personally ordered, reviewed and interpreted the following with the patient today  Procedure: Bilateral ear microscopy using microscope (CPT [id number]) Pre-procedure diagnosis: chronic eczematous changes, hearing loss Post-procedure diagnosis: same Indication: see above; given patient's otologic complaints and history, for improved and comprehensive examination of external ear and tympanic membrane, bilateral otologic examination using microscope was performed. Prior to proceeding, verbal consent was obtained after discussion of R/B/A  Procedure: Patient was placed semi-recumbent. Both ear canals were examined using the microscope with findings below. Patient tolerated the procedure well.  Right ear:  No significant lesions pinna. EAC: no significant lesions. Canal is clear. Eczematoid changes. moderate TM: Intact   Left ear:  No significant lesions pinna. EAC: no significant lesions. Canal is clear. Eczematoid changes. moderate TM: Intact   Impression & Plans:  Lindsay [name] is a 79 y.o. female  1. Chronic eczematous otitis externa of both ears   2. Epistaxis   3. Recurrent epistaxis   4. Seasonal allergies   5. Hearing loss, unspecified hearing loss type, unspecified laterality    - Findings and diagnoses discussed in detail with the patient. - Risks, benefits, and alternatives were reviewed. Through shared decision making, the patient elects to proceed with below. Assessment & Plan  Recurrent epistaxis Intermittent left-sided epistaxis with decreased frequency and severity. No acute severe bleeding. Nasal cauterization discussed and  deferred. - Continue nasal spray and ointment as prescribed. - Nasal cauterization discussed; deferred per shared decision making.  Chronic eczematous otitis externa, bilateral Improvement in eczematous changes with residual mild pruritus, worse on the left. No acute exacerbation. - Continue topical cream for symptomatic management.  Hearing loss Occasional difficulty hearing without prior audiometry. Baseline audiometry indicated. - Order baseline audiometry in clinic.  - Orders placed:  Orders Placed This Encounter  Procedures   Ambulatory referral to Audiology   - Education materials provided to the patient. - Follow up: 3 months with audio. Patient instructed to return sooner or go to the ED if new/worsening symptoms develop.  Thank you for allowing me the opportunity to care for your patient. Please  do not hesitate to contact me should you have any other questions.  Sincerely, Penne Croak, DO

## 2024-01-26 ENCOUNTER — Ambulatory Visit: Admitting: Internal Medicine

## 2024-01-27 ENCOUNTER — Other Ambulatory Visit: Payer: Self-pay

## 2024-01-27 ENCOUNTER — Telehealth: Payer: Self-pay

## 2024-01-27 ENCOUNTER — Other Ambulatory Visit

## 2024-01-27 ENCOUNTER — Ambulatory Visit: Admitting: Internal Medicine

## 2024-01-27 ENCOUNTER — Encounter: Payer: Self-pay | Admitting: Internal Medicine

## 2024-01-27 VITALS — BP 140/78 | HR 75 | Ht 63.0 in | Wt 188.0 lb

## 2024-01-27 DIAGNOSIS — E039 Hypothyroidism, unspecified: Secondary | ICD-10-CM | POA: Diagnosis not present

## 2024-01-27 DIAGNOSIS — M1711 Unilateral primary osteoarthritis, right knee: Secondary | ICD-10-CM

## 2024-01-27 NOTE — Progress Notes (Unsigned)
 "  Name: Lindsay [name]  MRN/ DOB: [id number], 02/05/1945    Age/ Sex: 79 y.o., female     PCP: Rollene Almarie LABOR, MD   Reason for Endocrinology Evaluation: Hypothyroidism     Initial Endocrinology Clinic Visit: 01/11/2019    PATIENT IDENTIFIER: Lindsay [name] is a 79 y.o., female with a past medical history of HTN, T2DM and Hypothyroidism. Lindsay [name] has followed with [facility] since 01/11/2019 for consultative assistance with management of her hypothyroidism.     HISTORICAL SUMMARY: The patient was diagnosed with hypothyroidism in 12/2018 with a TSH 25.71 uIU/mL . Was started on levothyroxine  50 mcg but developed diarrhea that Lindsay [name] attributed to LT-4 replacement and stopped it.  Lindsay [name] was encouraged to restart it on her initial visit to our clinic   No Fh of thyroid  disease   SUBJECTIVE:   Today (01/27/2024):  Lindsay [name] is here for a follow up on hypothyroidism.    Patient has been noted weight loss since her last visit here, patient on Rybelsus  No local neck swelling  No palpitations  No tremors , has hand numbness  No constipation or diarrhea  No Biotin   Levothyroxine  88 mcg, 1 tablet daily    HISTORY:  Past Medical History:  Past Medical History:  Diagnosis Date   Acid reflux    Anemia    Arthritis    Asthma    COPD (chronic obstructive pulmonary disease) (HCC)    Diabetes mellitus without complication (HCC)    TYPE 2    Hyperglycemia    Hypertension    Hypokalemia    Hypothyroidism    Obesity    PONV (postoperative nausea and vomiting)    Spinal stenosis    Urinary tract infection, chronic    Past Surgical History:  Past Surgical History:  Procedure Laterality Date   ABDOMINAL HYSTERECTOMY     ANKLE FUSION Left    CHOLECYSTECTOMY     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 Her smoking use included cigarettes. Lindsay [name] started smoking about 47 years ago. Lindsay [name] has a 10 pack-year smoking history. Lindsay [name] has never used smokeless tobacco. Lindsay [name] reports that Lindsay [name] does not drink alcohol  and does not use drugs. Family History:  Family History  Problem Relation Age of Onset   Asthma Daughter    Microcephaly Maternal Grandmother    Heart disease Maternal Grandmother        Not clear details, patient just knows Lindsay [name] took heart pills   BRCA 1/2 Neg Hx    Breast cancer Neg Hx      HOME MEDICATIONS: Allergies as of 01/27/2024       Reactions   Codeine Nausea Only   Lisinopril Cough   Losartan     cough   Morphine And Codeine Itching   Shellfish Allergy Itching        Medication List        Accurate as of January 27, 2024  1:47 PM. If you have any questions, ask your nurse or doctor.          albuterol  (2.5 MG/3ML) 0.083% nebulizer solution Commonly known as: PROVENTIL  Take 3 mLs (2.5 mg total) by nebulization every 6 (six) hours as needed for wheezing or shortness of breath.   albuterol  108 (90 Base) MCG/ACT inhaler Commonly  known as: VENTOLIN  HFA INHALE 2 PUFFS INTO THE LUNGS EVERY 6 HOURS AS NEEDED FOR WHEEZING OR SHORTNESS OF BREATH   benzonatate  200 MG capsule Commonly known as: TESSALON  Take 200 mg by mouth 3 (three) times daily as needed for cough.   blood glucose meter kit and supplies Kit Check blood sugar with glucometer every morning before your first meal   budesonide -formoterol  160-4.5 MCG/ACT inhaler Commonly known as: Symbicort  Inhale 2 puffs into the lungs 2 (two) times daily.   estradiol 0.1 MG/GM vaginal cream Commonly known as: ESTRACE Place vaginally.   gabapentin  300 MG capsule Commonly known as: NEURONTIN  TAKE 1 CAPSULE(300 MG) BY MOUTH THREE TIMES DAILY   Gemtesa 75 MG Tabs Generic drug: Vibegron Take 1 tablet by mouth daily.   hydrOXYzine  25 MG capsule Commonly known as:  VISTARIL  Take 1 capsule (25 mg total) by mouth 2 (two) times daily as needed.   levothyroxine  88 MCG tablet Commonly known as: SYNTHROID  Take 1 tablet (88 mcg total) by mouth daily.   losartan  50 MG tablet Commonly known as: COZAAR  TAKE 1 TABLET(50 MG) BY MOUTH DAILY   meloxicam  15 MG tablet Commonly known as: MOBIC  TAKE 1 TABLET(15 MG) BY MOUTH DAILY   montelukast  10 MG tablet Commonly known as: SINGULAIR  TAKE 1 TABLET(10 MG) BY MOUTH AT BEDTIME   mupirocin  ointment 2 % Commonly known as: BACTROBAN  Apply 1 Application topically 2 (two) times daily. Apply small amount inside nose left side   NON FORMULARY DIET- REGULAR CCD   pantoprazole  40 MG tablet Commonly known as: PROTONIX  Take 1 tablet (40 mg total) by mouth daily.   Restasis 0.05 % ophthalmic emulsion Generic drug: cycloSPORINE 1 drop 2 (two) times daily.   Rybelsus  7 MG Tabs Generic drug: Semaglutide  Take 1 tablet (7 mg total) by mouth daily.   SALONPAS PAIN RELIEF PATCH EX Place 1 patch onto the skin daily as needed (pain).   simvastatin  40 MG tablet Commonly known as: ZOCOR  Take 1 tablet (40 mg total) by mouth daily at 6 PM.   sodium chloride  0.65 % Soln nasal spray Commonly known as: OCEAN Place 2 sprays into both nostrils in the morning, at noon, in the evening, and at bedtime.   tiZANidine  4 MG tablet Commonly known as: ZANAFLEX  TAKE 1/2 TO 1 TABLET(2 TO 4 MG) BY MOUTH EVERY 8 HOURS AS NEEDED FOR MUSCLE SPASMS   triamcinolone  cream 0.1 % Commonly known as: KENALOG  Apply 1 Application topically 2 (two) times daily as needed. Small amount inside ear canal for ear itch   Vitamin D3 50 MCG (2000 UT) capsule Take 1 capsule (2,000 Units total) by mouth daily. Must keep scheduled follow=-up appt for future refills          OBJECTIVE:   PHYSICAL EXAM: VS: BP (!) 140/78   Pulse 75   Ht 5' 3 (1.6 m)   Wt 188 lb (85.3 kg)   SpO2 99%   BMI 33.30 kg/m    EXAM: General: Pt appears well and  is in NAD  Neck: General: Supple without adenopathy. Thyroid : Thyroid  size normal.  No goiter or nodules appreciated.   Lungs: Clear with good BS bilat   Heart: Auscultation: RRR.  Extremities:  BL LE: No pretibial edema normal   Mental Status: Judgment, insight: Intact Mood and affect: No depression, anxiety, or agitation     DATA REVIEWED:  *****  Old records , labs and images have been reviewed.     ASSESSMENT / PLAN /  RECOMMENDATIONS:   Hypothyroidism:   -Patient is clinically euthyroid -No local neck symptoms -No local neck symptoms - I did explain to the patient that levothyroxine  is weight dependent for a lot of people, and with weight loss or weight gain levothyroxine  requirement changes which results in abnormal TFTs  Medications  Levothyroxine  88 mcg daily    F/u in 6 months   Signed electronically by: Stefano Redgie Butts, MD  [facility]  [facility] [address] [place] [place], [place] [address] Phone: [phone] FAX: [phone]      CC: Rollene Almarie LABOR, MD [address] [place] [address] Phone: [phone]  Fax: [phone]   Return to Endocrinology clinic as below: Future Appointments  Date Time Provider Department Center  01/29/2024 11:30 AM Hunsucker, [name], MD [facility] [address]  03/16/2024  1:50 PM [facility]ANNUAL WELLNESS VISIT [facility]  03/22/2024 10:20 AM Rollene Almarie LABOR, MD [facility]  04/12/2024  9:30 AM Leroux-Martinez, Rosaline Jansky, AUD [facility] None  04/12/2024 10:00 AM Anice Riis, DO [facility] None    "

## 2024-01-27 NOTE — Patient Instructions (Signed)

## 2024-01-28 ENCOUNTER — Ambulatory Visit: Payer: Self-pay | Admitting: Internal Medicine

## 2024-01-28 LAB — T4, FREE: Free T4: 1.5 ng/dL (ref 0.8–1.8)

## 2024-01-28 LAB — TSH: TSH: 0.18 m[IU]/L — ABNORMAL LOW (ref 0.40–4.50)

## 2024-01-28 MED ORDER — LEVOTHYROXINE SODIUM 75 MCG PO TABS: 75.0000 ug | ORAL_TABLET | Freq: Every day | ORAL | 6 refills | Status: AC

## 2024-01-28 NOTE — Telephone Encounter (Signed)
Patient informed of results and recommendations

## 2024-01-28 NOTE — Telephone Encounter (Signed)
 Please let the patient know that her current dose of levothyroxine  88 mcg is too much for her, this is expected based on continued weight loss    Please stop levothyroxine  88 and start a new prescription of levothyroxine  75 mcg daily   Thanks

## 2024-01-29 ENCOUNTER — Ambulatory Visit: Admitting: Pulmonary Disease

## 2024-01-29 ENCOUNTER — Encounter: Payer: Self-pay | Admitting: Pulmonary Disease

## 2024-01-29 VITALS — BP 107/73 | HR 76 | Temp 98.4°F | Ht 63.0 in | Wt 189.0 lb

## 2024-01-29 DIAGNOSIS — J40 Bronchitis, not specified as acute or chronic: Secondary | ICD-10-CM | POA: Diagnosis not present

## 2024-01-29 DIAGNOSIS — Z8616 Personal history of COVID-19: Secondary | ICD-10-CM

## 2024-01-29 DIAGNOSIS — J4551 Severe persistent asthma with (acute) exacerbation: Secondary | ICD-10-CM

## 2024-01-29 DIAGNOSIS — J4550 Severe persistent asthma, uncomplicated: Secondary | ICD-10-CM

## 2024-01-29 DIAGNOSIS — J4540 Moderate persistent asthma, uncomplicated: Secondary | ICD-10-CM

## 2024-01-29 DIAGNOSIS — Z87891 Personal history of nicotine dependence: Secondary | ICD-10-CM

## 2024-01-29 DIAGNOSIS — Z832 Family history of diseases of the blood and blood-forming organs and certain disorders involving the immune mechanism: Secondary | ICD-10-CM

## 2024-01-29 MED ORDER — ALBUTEROL SULFATE (2.5 MG/3ML) 0.083% IN NEBU: 2.5000 mg | INHALATION_SOLUTION | Freq: Four times a day (QID) | RESPIRATORY_TRACT | 2 refills | Status: AC | PRN

## 2024-01-29 NOTE — Patient Instructions (Signed)
 Nice to see you again  Will get blood work today to screen for scleroderma  No changes to medication  I refilled the albuterol  nebulizer  Return to clinic in 6 months or sooner as needed with Dr. [name]

## 2024-01-29 NOTE — Progress Notes (Signed)
 "  @Patient  ID: Lindsay [name], female    DOB: 09/03/1945, 79 y.o.   MRN: [id number]  Chief Complaint  Patient presents with   Asthma    Pt states since LOV breathing has been pretty good     Referring provider: Rollene Almarie LABOR, MD  HPI:   79 y.o. woman with history of asthma whom we are seeing for cough, acute on chronic.  Most recent PCP note reviewed.  Most recent endocrine note reviewed.  Returns for routine follow-up.  Doing well.  Cough manageable with as needed Symbicort .  No concerns.  We discussed her daughter, history of scleroderma.  Blood work today to screen for this.  HPI initial visit: Patient seen in pulmonary clinic in the past most recently 2021.  Started in 2019.  For cough.  Felt to be referred coloration of asthma as well as UA CS.  Seen improved on Symbicort .  Review of records indicate multiple rounds of bronchitis or cough with chest x-rays in the past.  Most recently 05/2023 with clear lungs on chest x-ray, similar 07/2022 with clear lungs bilaterally on my review and interpretation.  It sounds like sometime in the last year or so her inhaler regimen was escalated from low-dose Symbicort  to Breztri .  In fact she is taking both now it seems.  No real change in the cough.  03/2023 she reports being diagnosed with COVID.  Persistent cough.  Gradually got better.  Then about 3 weeks ago cough returned.  Productive.  Brings up phlegm.  Clear.  All times a day.  Sometimes worse at night.  Seen in the ED as above 05/11/2023 with clear chest x-ray.  Prescribed doxycycline  and prednisone .  She is not sure how much it is helping albeit less than 48 hours.  We discussed at length asthma.  Recurrent bronchitis seen in the past.  Likely demonstration of poorly controlled asthma.  Discussed role and rationale for maintenance inhaler.  Discussed role and rationale for possible Biologics in the future.  Discussed that with recent COVID infection likely worsened postviral.  Notably  eosinophils 300 07/2022.  Questionaires / Pulmonary Flowsheets:   ACT:  Asthma Control Test ACT Total Score  04/06/2020 11:07 AM 14  03/09/2020  9:54 AM 18    MMRC:     No data to display          Epworth:      No data to display          Tests:   FENO:  No results found for: NITRICOXIDE  PFT:     No data to display          WALK:      No data to display          Imaging: Personally reviewed and as per EMR and discussion in this note No results found.   Lab Results: Personally reviewed CBC    Component Value Date/Time   WBC 7.5 09/15/2023 0914   RBC 4.22 09/15/2023 0914   HGB 11.0 (L) 09/15/2023 0914   HGB 10.6 (L) 08/03/2022 0920   HCT 34.3 (L) 09/15/2023 0914   HCT 34.2 08/03/2022 0920   PLT 325.0 09/15/2023 0914   PLT 359 08/03/2022 0920   MCV 81.3 09/15/2023 0914   MCV 83 08/03/2022 0920   MCH 25.7 (L) 08/03/2022 0920   MCH 26.4 09/21/2019 1010   MCHC 32.1 09/15/2023 0914   RDW 14.2 09/15/2023 0914   RDW 14.1 08/03/2022 0920   LYMPHSABS  2.1 08/03/2022 0920   MONOABS 0.7 03/09/2020 1024   EOSABS 0.3 08/03/2022 0920   BASOSABS 0.1 08/03/2022 0920    BMET    Component Value Date/Time   NA 141 09/15/2023 0914   NA 142 08/03/2022 0920   K 3.6 09/15/2023 0914   CL 106 09/15/2023 0914   CO2 29 09/15/2023 0914   GLUCOSE 81 09/15/2023 0914   BUN 17 09/15/2023 0914   BUN 29 (H) 08/03/2022 0920   CREATININE 0.71 09/15/2023 0914   CREATININE 0.90 09/02/2019 1410   CALCIUM  10.2 09/15/2023 0914   GFRNONAA >60 09/21/2019 1010   GFRAA >60 09/21/2019 1010    BNP No results found for: BNP  ProBNP No results found for: PROBNP  Specialty Problems       Pulmonary Problems   Persistent cough   Bleeding from the nose    Allergies  Allergen Reactions   Codeine Nausea Only   Lisinopril Cough   Losartan      cough   Morphine And Codeine Itching   Shellfish Allergy Itching    Immunization History  Administered Date(s)  Administered   DTaP 02/10/2015   Fluad Quad(high Dose 65+) 09/09/2018, 10/18/2020, 09/24/2021   Fluad Trivalent(High Dose 65+) 09/09/2022   INFLUENZA, HIGH DOSE SEASONAL PF 03/11/2018   Influenza-Unspecified 09/03/2023   Moderna SARS-COV2 Booster Vaccination 04/27/2020   Moderna Sars-Covid-2 Vaccination 02/18/2019, 03/18/2019, 11/25/2019   PFIZER Comirnaty(Gray Top)Covid-19 Tri-Sucrose Vaccine 09/24/2021   Pneumococcal Conjugate-13 10/12/2014   Pneumococcal Polysaccharide-23 11/04/2016   RSV,unspecified 09/24/2021   Unspecified SARS-COV-2 Vaccination 09/13/2022   Zoster Recombinant(Shingrix) 08/21/2016, 11/03/2016    Past Medical History:  Diagnosis Date   Acid reflux    Anemia    Arthritis    Asthma    COPD (chronic obstructive pulmonary disease) (HCC)    Diabetes mellitus without complication (HCC)    TYPE 2    Hyperglycemia    Hypertension    Hypokalemia    Hypothyroidism    Obesity    PONV (postoperative nausea and vomiting)    Spinal stenosis    Urinary tract infection, chronic     Tobacco History: Social History   Tobacco Use  Smoking Status Former   Current packs/day: 0.00   Average packs/day: 0.5 packs/day for 20.0 years (10.0 ttl pk-yrs)   Types: Cigarettes   Start date: 01/06/1977   Quit date: 01/06/1997   Years since quitting: 27.0  Smokeless Tobacco Never   Counseling given: Not Answered   Continue to not smoke  Outpatient Encounter Medications as of 01/29/2024  Medication Sig   albuterol  (VENTOLIN  HFA) 108 (90 Base) MCG/ACT inhaler INHALE 2 PUFFS INTO THE LUNGS EVERY 6 HOURS AS NEEDED FOR WHEEZING OR SHORTNESS OF BREATH   budesonide -formoterol  (SYMBICORT ) 160-4.5 MCG/ACT inhaler Inhale 2 puffs into the lungs 2 (two) times daily.   Cholecalciferol  (VITAMIN D3) 50 MCG (2000 UT) capsule Take 1 capsule (2,000 Units total) by mouth daily. Must keep scheduled follow=-up appt for future refills   estradiol (ESTRACE) 0.1 MG/GM vaginal cream Place vaginally.    gabapentin  (NEURONTIN ) 300 MG capsule TAKE 1 CAPSULE(300 MG) BY MOUTH THREE TIMES DAILY (Patient taking differently: As needed)   GEMTESA 75 MG TABS Take 1 tablet by mouth daily.   hydrOXYzine  (VISTARIL ) 25 MG capsule Take 1 capsule (25 mg total) by mouth 2 (two) times daily as needed.   levothyroxine  (SYNTHROID ) 75 MCG tablet Take 1 tablet (75 mcg total) by mouth daily.   losartan  (COZAAR ) 50 MG tablet TAKE 1  TABLET(50 MG) BY MOUTH DAILY   meloxicam  (MOBIC ) 15 MG tablet TAKE 1 TABLET(15 MG) BY MOUTH DAILY (Patient taking differently: As needed)   Menthol-Methyl Salicylate (SALONPAS PAIN RELIEF PATCH EX) Place 1 patch onto the skin daily as needed (pain).   montelukast  (SINGULAIR ) 10 MG tablet TAKE 1 TABLET(10 MG) BY MOUTH AT BEDTIME   mupirocin  ointment (BACTROBAN ) 2 % Apply 1 Application topically 2 (two) times daily. Apply small amount inside nose left side   pantoprazole  (PROTONIX ) 40 MG tablet Take 1 tablet (40 mg total) by mouth daily.   RESTASIS 0.05 % ophthalmic emulsion 1 drop 2 (two) times daily.   Semaglutide  (RYBELSUS ) 7 MG TABS Take 1 tablet (7 mg total) by mouth daily.   simvastatin  (ZOCOR ) 40 MG tablet Take 1 tablet (40 mg total) by mouth daily at 6 PM.   sodium chloride  (OCEAN) 0.65 % SOLN nasal spray Place 2 sprays into both nostrils in the morning, at noon, in the evening, and at bedtime.   tiZANidine  (ZANAFLEX ) 4 MG tablet TAKE 1/2 TO 1 TABLET(2 TO 4 MG) BY MOUTH EVERY 8 HOURS AS NEEDED FOR MUSCLE SPASMS (Patient taking differently: As needed)   triamcinolone  cream (KENALOG ) 0.1 % Apply 1 Application topically 2 (two) times daily as needed. Small amount inside ear canal for ear itch   [DISCONTINUED] albuterol  (PROVENTIL ) (2.5 MG/3ML) 0.083% nebulizer solution Take 3 mLs (2.5 mg total) by nebulization every 6 (six) hours as needed for wheezing or shortness of breath.   albuterol  (PROVENTIL ) (2.5 MG/3ML) 0.083% nebulizer solution Take 3 mLs (2.5 mg total) by nebulization every 6  (six) hours as needed for wheezing or shortness of breath.   NON FORMULARY DIET- REGULAR CCD (Patient not taking: Reported on 01/29/2024)   [DISCONTINUED] benzonatate  (TESSALON ) 200 MG capsule Take 200 mg by mouth 3 (three) times daily as needed for cough. (Patient not taking: Reported on 01/29/2024)   [DISCONTINUED] blood glucose meter kit and supplies KIT Check blood sugar with glucometer every morning before your first meal (Patient not taking: Reported on 01/29/2024)   [DISCONTINUED] levothyroxine  (SYNTHROID ) 88 MCG tablet Take 1 tablet (88 mcg total) by mouth daily.   No facility-administered encounter medications on file as of 01/29/2024.     Review of Systems  Review of Systems  N/a Physical Exam  BP 107/73   Pulse 76   Temp 98.4 F (36.9 C) (Oral)   Ht 5' 3 (1.6 m) Comment: per patient  Wt 189 lb (85.7 kg)   SpO2 97% Comment: on RA  BMI 33.48 kg/m   Wt Readings from Last 5 Encounters:  01/29/24 189 lb (85.7 kg)  01/27/24 188 lb (85.3 kg)  01/12/24 187 lb (84.8 kg)  12/21/23 191 lb 12.8 oz (87 kg)  10/22/23 199 lb (90.3 kg)    BMI Readings from Last 5 Encounters:  01/29/24 33.48 kg/m  01/27/24 33.30 kg/m  01/12/24 33.13 kg/m  12/21/23 33.98 kg/m  10/22/23 35.25 kg/m     Physical Exam General: In chair no distress Eyes: No icterus Neck: No JVP Abdomen: Nondistended Neuro: Ambulates with assistance of cane Pulmonary: Clear, normal work of breathing   Assessment & Plan:   Severe persistent asthma with cough variant features: Likely worsened after COVID infection.  History of frequent bronchitis in the past.  Overall cough well-controlled with as needed Symbicort .  Albuterol  nebulizer refilled today.  Bronchitis, recurrent: Likely reflection of poorly controlled asthma.  Marked improvement, using Symbicort  as needed.  To continue.  Family history  of scleroderma: In daughter.  She is concerned about this in herself.  ENA screen ordered.   Return in  about 6 months (around 07/28/2024) for f/u Dr. [name].   Donnice JONELLE [name], MD 01/29/2024   "

## 2024-01-31 LAB — ANA+ENA+DNA/DS+SCL 70+SJOSSA/B
ANA Titer 1: NEGATIVE
ENA RNP Ab: 6.9 AI — ABNORMAL HIGH (ref 0.0–0.9)
ENA SM Ab Ser-aCnc: <0.2 AI (ref 0.0–0.9)
ENA SSA (RO) Ab: <0.2 AI (ref 0.0–0.9)
ENA SSB (LA) Ab: <0.2 AI (ref 0.0–0.9)
Scleroderma (Scl-70) (ENA) Antibody, IgG: <0.2 AI (ref 0.0–0.9)
dsDNA Ab: 1 [IU]/mL (ref 0–9)

## 2024-02-02 ENCOUNTER — Ambulatory Visit: Payer: Self-pay | Admitting: Pulmonary Disease

## 2024-02-02 DIAGNOSIS — D8989 Other specified disorders involving the immune mechanism, not elsewhere classified: Secondary | ICD-10-CM

## 2024-02-02 NOTE — Telephone Encounter (Signed)
 Copied from CRM #[id number]. Topic: Referral - Request for Referral >> Feb 02, 2024  3:20 PM Charolett L wrote: Did the patient discuss referral with their provider in the last year? Yes (If No - schedule appointment) (If Yes - send message)  Appointment offered? Yes  Type of order/referral and detailed reason for visit: Ortho and pain management  Preference of office, provider, location: [facility] [address] [place], [place] [address] Phone: [phone] Fax# [phone] If referral order, have you been seen by this specialty before? Yes (If Yes, this issue or another issue? When? Where? Patient has an appt 01/29 @ 11:15am  Can we respond through MyChart? No

## 2024-02-03 ENCOUNTER — Other Ambulatory Visit: Payer: Self-pay | Admitting: Internal Medicine

## 2024-02-10 NOTE — Telephone Encounter (Signed)
 Referral has been placed and faxed to office. Confirmation received.

## 2024-02-10 NOTE — Telephone Encounter (Signed)
 Patient return call and has been made aware of previous notation in regards to referral placement. Patient states she has already been seen by Ortho and will be attending her 3rd appt with them tomorrow.

## 2024-03-16 ENCOUNTER — Ambulatory Visit

## 2024-03-22 ENCOUNTER — Ambulatory Visit: Admitting: Internal Medicine

## 2024-04-12 ENCOUNTER — Ambulatory Visit (INDEPENDENT_AMBULATORY_CARE_PROVIDER_SITE_OTHER)

## 2024-04-12 ENCOUNTER — Ambulatory Visit (INDEPENDENT_AMBULATORY_CARE_PROVIDER_SITE_OTHER): Admitting: Audiology

## 2024-04-28 ENCOUNTER — Ambulatory Visit: Admitting: Internal Medicine

## 2024-07-26 ENCOUNTER — Ambulatory Visit: Admitting: Internal Medicine
